# Patient Record
Sex: Male | Born: 1935 | Race: Black or African American | Hispanic: No | Marital: Married | Smoking: Former smoker
Health system: Southern US, Community
[De-identification: ages and names within clinical notes are randomized; demographics above are authoritative.]

## PROBLEM LIST (undated history)

## (undated) DIAGNOSIS — I639 Cerebral infarction, unspecified: Secondary | ICD-10-CM

## (undated) DIAGNOSIS — R0600 Dyspnea, unspecified: Secondary | ICD-10-CM

## (undated) DIAGNOSIS — E119 Type 2 diabetes mellitus without complications: Secondary | ICD-10-CM

## (undated) DIAGNOSIS — I10 Essential (primary) hypertension: Secondary | ICD-10-CM

## (undated) DIAGNOSIS — D649 Anemia, unspecified: Secondary | ICD-10-CM

## (undated) DIAGNOSIS — N184 Chronic kidney disease, stage 4 (severe): Secondary | ICD-10-CM

## (undated) DIAGNOSIS — I509 Heart failure, unspecified: Secondary | ICD-10-CM

## (undated) DIAGNOSIS — E785 Hyperlipidemia, unspecified: Secondary | ICD-10-CM

## (undated) HISTORY — PX: NECK SURGERY: SHX720

## (undated) HISTORY — PX: REPLACEMENT TOTAL KNEE: SUR1224

---

## 1998-07-27 ENCOUNTER — Ambulatory Visit (HOSPITAL_BASED_OUTPATIENT_CLINIC_OR_DEPARTMENT_OTHER): Admission: RE | Admit: 1998-07-27 | Discharge: 1998-07-27 | Payer: Self-pay | Admitting: Otolaryngology

## 1998-11-13 ENCOUNTER — Encounter: Payer: Self-pay | Admitting: Orthopedic Surgery

## 1998-11-20 ENCOUNTER — Inpatient Hospital Stay (HOSPITAL_COMMUNITY): Admission: RE | Admit: 1998-11-20 | Discharge: 1998-11-23 | Payer: Self-pay | Admitting: Orthopedic Surgery

## 1998-11-23 ENCOUNTER — Inpatient Hospital Stay (HOSPITAL_COMMUNITY)
Admission: RE | Admit: 1998-11-23 | Discharge: 1998-11-30 | Payer: Self-pay | Admitting: Physical Medicine and Rehabilitation

## 1999-04-02 ENCOUNTER — Ambulatory Visit (HOSPITAL_COMMUNITY): Admission: RE | Admit: 1999-04-02 | Discharge: 1999-04-02 | Payer: Self-pay | Admitting: *Deleted

## 1999-04-02 ENCOUNTER — Encounter: Payer: Self-pay | Admitting: *Deleted

## 2000-08-25 ENCOUNTER — Ambulatory Visit (HOSPITAL_COMMUNITY): Admission: RE | Admit: 2000-08-25 | Discharge: 2000-08-25 | Payer: Self-pay | Admitting: *Deleted

## 2000-11-05 ENCOUNTER — Encounter: Admission: RE | Admit: 2000-11-05 | Discharge: 2001-02-03 | Payer: Self-pay | Admitting: *Deleted

## 2001-05-21 ENCOUNTER — Encounter: Admission: RE | Admit: 2001-05-21 | Discharge: 2001-06-01 | Payer: Self-pay | Admitting: Internal Medicine

## 2001-07-15 ENCOUNTER — Encounter: Payer: Self-pay | Admitting: Specialist

## 2001-07-20 ENCOUNTER — Ambulatory Visit (HOSPITAL_COMMUNITY): Admission: RE | Admit: 2001-07-20 | Discharge: 2001-07-20 | Payer: Self-pay | Admitting: Specialist

## 2001-09-08 ENCOUNTER — Ambulatory Visit (HOSPITAL_COMMUNITY): Admission: RE | Admit: 2001-09-08 | Discharge: 2001-09-08 | Payer: Self-pay | Admitting: *Deleted

## 2001-09-08 ENCOUNTER — Encounter: Payer: Self-pay | Admitting: *Deleted

## 2001-09-23 ENCOUNTER — Encounter: Admission: RE | Admit: 2001-09-23 | Discharge: 2001-10-05 | Payer: Self-pay | Admitting: Internal Medicine

## 2001-09-28 ENCOUNTER — Ambulatory Visit (HOSPITAL_COMMUNITY): Admission: RE | Admit: 2001-09-28 | Discharge: 2001-09-29 | Payer: Self-pay | Admitting: Ophthalmology

## 2001-11-27 ENCOUNTER — Encounter (INDEPENDENT_AMBULATORY_CARE_PROVIDER_SITE_OTHER): Payer: Self-pay | Admitting: Specialist

## 2001-11-27 ENCOUNTER — Ambulatory Visit (HOSPITAL_COMMUNITY): Admission: RE | Admit: 2001-11-27 | Discharge: 2001-11-27 | Payer: Self-pay | Admitting: *Deleted

## 2002-01-25 ENCOUNTER — Encounter (HOSPITAL_BASED_OUTPATIENT_CLINIC_OR_DEPARTMENT_OTHER): Admission: RE | Admit: 2002-01-25 | Discharge: 2002-04-25 | Payer: Self-pay | Admitting: Internal Medicine

## 2002-05-03 ENCOUNTER — Encounter (HOSPITAL_BASED_OUTPATIENT_CLINIC_OR_DEPARTMENT_OTHER): Admission: RE | Admit: 2002-05-03 | Discharge: 2002-05-14 | Payer: Self-pay | Admitting: Internal Medicine

## 2002-09-23 ENCOUNTER — Ambulatory Visit (HOSPITAL_COMMUNITY): Admission: RE | Admit: 2002-09-23 | Discharge: 2002-09-23 | Payer: Self-pay | Admitting: *Deleted

## 2002-09-27 ENCOUNTER — Ambulatory Visit (HOSPITAL_COMMUNITY): Admission: RE | Admit: 2002-09-27 | Discharge: 2002-09-27 | Payer: Self-pay | Admitting: *Deleted

## 2002-09-27 ENCOUNTER — Encounter: Payer: Self-pay | Admitting: *Deleted

## 2003-05-13 ENCOUNTER — Encounter (HOSPITAL_BASED_OUTPATIENT_CLINIC_OR_DEPARTMENT_OTHER): Admission: RE | Admit: 2003-05-13 | Discharge: 2003-05-20 | Payer: Self-pay | Admitting: Internal Medicine

## 2003-11-28 ENCOUNTER — Ambulatory Visit (HOSPITAL_COMMUNITY): Admission: RE | Admit: 2003-11-28 | Discharge: 2003-11-28 | Payer: Self-pay | Admitting: Specialist

## 2004-06-12 ENCOUNTER — Encounter: Admission: RE | Admit: 2004-06-12 | Discharge: 2004-06-12 | Payer: Self-pay | Admitting: Cardiology

## 2004-07-04 ENCOUNTER — Ambulatory Visit (HOSPITAL_COMMUNITY): Admission: RE | Admit: 2004-07-04 | Discharge: 2004-07-04 | Payer: Self-pay | Admitting: Cardiology

## 2004-09-03 ENCOUNTER — Ambulatory Visit: Payer: Self-pay | Admitting: Family Medicine

## 2004-09-04 ENCOUNTER — Encounter: Admission: RE | Admit: 2004-09-04 | Discharge: 2004-09-04 | Payer: Self-pay | Admitting: Family Medicine

## 2004-09-14 ENCOUNTER — Ambulatory Visit: Payer: Self-pay | Admitting: Family Medicine

## 2004-11-20 ENCOUNTER — Ambulatory Visit: Payer: Self-pay | Admitting: Family Medicine

## 2004-11-27 ENCOUNTER — Ambulatory Visit: Payer: Self-pay

## 2004-12-10 ENCOUNTER — Inpatient Hospital Stay (HOSPITAL_COMMUNITY): Admission: RE | Admit: 2004-12-10 | Discharge: 2004-12-13 | Payer: Self-pay | Admitting: Orthopedic Surgery

## 2004-12-10 ENCOUNTER — Ambulatory Visit: Payer: Self-pay | Admitting: Internal Medicine

## 2004-12-13 ENCOUNTER — Ambulatory Visit: Payer: Self-pay | Admitting: Physical Medicine & Rehabilitation

## 2004-12-13 ENCOUNTER — Inpatient Hospital Stay
Admission: RE | Admit: 2004-12-13 | Discharge: 2004-12-18 | Payer: Self-pay | Admitting: Physical Medicine & Rehabilitation

## 2005-01-21 ENCOUNTER — Ambulatory Visit: Payer: Self-pay | Admitting: Family Medicine

## 2005-02-27 ENCOUNTER — Ambulatory Visit: Payer: Self-pay | Admitting: Family Medicine

## 2005-05-13 ENCOUNTER — Ambulatory Visit: Payer: Self-pay | Admitting: Family Medicine

## 2005-07-31 ENCOUNTER — Ambulatory Visit: Payer: Self-pay | Admitting: Family Medicine

## 2005-09-03 ENCOUNTER — Ambulatory Visit: Payer: Self-pay | Admitting: Pulmonary Disease

## 2005-09-08 ENCOUNTER — Encounter: Payer: Self-pay | Admitting: Family Medicine

## 2005-09-16 ENCOUNTER — Ambulatory Visit (HOSPITAL_BASED_OUTPATIENT_CLINIC_OR_DEPARTMENT_OTHER): Admission: RE | Admit: 2005-09-16 | Discharge: 2005-09-16 | Payer: Self-pay | Admitting: Pulmonary Disease

## 2005-09-17 ENCOUNTER — Ambulatory Visit: Payer: Self-pay | Admitting: Pulmonary Disease

## 2005-10-07 ENCOUNTER — Ambulatory Visit: Payer: Self-pay | Admitting: Pulmonary Disease

## 2005-10-29 ENCOUNTER — Ambulatory Visit: Payer: Self-pay | Admitting: Family Medicine

## 2005-10-31 ENCOUNTER — Ambulatory Visit: Payer: Self-pay | Admitting: Pulmonary Disease

## 2005-12-06 ENCOUNTER — Encounter: Payer: Self-pay | Admitting: Family Medicine

## 2006-02-07 ENCOUNTER — Ambulatory Visit: Payer: Self-pay | Admitting: Family Medicine

## 2006-03-21 ENCOUNTER — Encounter: Admission: RE | Admit: 2006-03-21 | Discharge: 2006-03-21 | Payer: Self-pay | Admitting: Orthopedic Surgery

## 2006-05-16 ENCOUNTER — Ambulatory Visit: Payer: Self-pay | Admitting: Pulmonary Disease

## 2006-06-23 ENCOUNTER — Ambulatory Visit: Payer: Self-pay | Admitting: Family Medicine

## 2006-06-27 ENCOUNTER — Encounter: Payer: Self-pay | Admitting: Family Medicine

## 2006-07-22 ENCOUNTER — Ambulatory Visit: Payer: Self-pay | Admitting: Family Medicine

## 2006-07-29 ENCOUNTER — Ambulatory Visit: Payer: Self-pay | Admitting: Gastroenterology

## 2006-07-30 ENCOUNTER — Ambulatory Visit: Payer: Self-pay | Admitting: Gastroenterology

## 2006-08-04 ENCOUNTER — Ambulatory Visit: Payer: Self-pay | Admitting: Pulmonary Disease

## 2006-09-03 ENCOUNTER — Ambulatory Visit: Payer: Self-pay | Admitting: Gastroenterology

## 2006-09-12 ENCOUNTER — Ambulatory Visit: Payer: Self-pay | Admitting: Gastroenterology

## 2006-09-12 ENCOUNTER — Encounter (INDEPENDENT_AMBULATORY_CARE_PROVIDER_SITE_OTHER): Payer: Self-pay | Admitting: Specialist

## 2006-11-18 ENCOUNTER — Ambulatory Visit: Payer: Self-pay | Admitting: Family Medicine

## 2007-01-16 ENCOUNTER — Ambulatory Visit: Payer: Self-pay | Admitting: Vascular Surgery

## 2007-02-04 ENCOUNTER — Ambulatory Visit: Payer: Self-pay | Admitting: Family Medicine

## 2007-03-23 ENCOUNTER — Inpatient Hospital Stay (HOSPITAL_COMMUNITY): Admission: EM | Admit: 2007-03-23 | Discharge: 2007-03-25 | Payer: Self-pay | Admitting: Emergency Medicine

## 2007-03-23 ENCOUNTER — Ambulatory Visit: Payer: Self-pay | Admitting: Family Medicine

## 2007-03-23 DIAGNOSIS — M1990 Unspecified osteoarthritis, unspecified site: Secondary | ICD-10-CM | POA: Insufficient documentation

## 2007-03-23 DIAGNOSIS — R51 Headache: Secondary | ICD-10-CM

## 2007-03-23 DIAGNOSIS — J309 Allergic rhinitis, unspecified: Secondary | ICD-10-CM | POA: Insufficient documentation

## 2007-03-23 DIAGNOSIS — E785 Hyperlipidemia, unspecified: Secondary | ICD-10-CM

## 2007-03-23 DIAGNOSIS — R519 Headache, unspecified: Secondary | ICD-10-CM | POA: Insufficient documentation

## 2007-03-23 DIAGNOSIS — E119 Type 2 diabetes mellitus without complications: Secondary | ICD-10-CM

## 2007-03-23 DIAGNOSIS — I10 Essential (primary) hypertension: Secondary | ICD-10-CM | POA: Insufficient documentation

## 2007-03-24 ENCOUNTER — Ambulatory Visit: Payer: Self-pay | Admitting: Cardiology

## 2007-03-24 ENCOUNTER — Ambulatory Visit: Payer: Self-pay | Admitting: Internal Medicine

## 2007-03-24 ENCOUNTER — Ambulatory Visit: Payer: Self-pay | Admitting: Vascular Surgery

## 2007-03-30 ENCOUNTER — Ambulatory Visit: Payer: Self-pay | Admitting: Family Medicine

## 2007-03-30 DIAGNOSIS — Z8679 Personal history of other diseases of the circulatory system: Secondary | ICD-10-CM

## 2007-04-09 ENCOUNTER — Encounter: Payer: Self-pay | Admitting: Family Medicine

## 2007-04-09 ENCOUNTER — Encounter: Admission: RE | Admit: 2007-04-09 | Discharge: 2007-06-08 | Payer: Self-pay | Admitting: Family

## 2007-04-13 ENCOUNTER — Ambulatory Visit: Payer: Self-pay | Admitting: Family Medicine

## 2007-04-24 ENCOUNTER — Telehealth: Payer: Self-pay | Admitting: Family Medicine

## 2007-04-27 ENCOUNTER — Telehealth: Payer: Self-pay | Admitting: Family Medicine

## 2007-05-04 ENCOUNTER — Encounter: Payer: Self-pay | Admitting: Family Medicine

## 2007-05-18 ENCOUNTER — Ambulatory Visit: Payer: Self-pay | Admitting: Family Medicine

## 2007-05-28 ENCOUNTER — Encounter: Payer: Self-pay | Admitting: Family Medicine

## 2007-05-29 ENCOUNTER — Telehealth: Payer: Self-pay | Admitting: Family Medicine

## 2007-06-03 ENCOUNTER — Encounter: Admission: RE | Admit: 2007-06-03 | Discharge: 2007-07-02 | Payer: Self-pay | Admitting: Neurology

## 2007-06-15 ENCOUNTER — Ambulatory Visit: Payer: Self-pay | Admitting: Family Medicine

## 2007-06-15 DIAGNOSIS — J209 Acute bronchitis, unspecified: Secondary | ICD-10-CM

## 2007-06-15 DIAGNOSIS — N318 Other neuromuscular dysfunction of bladder: Secondary | ICD-10-CM | POA: Insufficient documentation

## 2007-06-22 ENCOUNTER — Telehealth: Payer: Self-pay | Admitting: Family Medicine

## 2007-06-22 DIAGNOSIS — G4733 Obstructive sleep apnea (adult) (pediatric): Secondary | ICD-10-CM

## 2007-06-26 ENCOUNTER — Telehealth: Payer: Self-pay | Admitting: Family Medicine

## 2007-06-29 ENCOUNTER — Encounter: Admission: RE | Admit: 2007-06-29 | Discharge: 2007-08-19 | Payer: Self-pay | Admitting: Family Medicine

## 2007-07-15 ENCOUNTER — Telehealth: Payer: Self-pay | Admitting: Family Medicine

## 2007-07-17 ENCOUNTER — Ambulatory Visit: Payer: Self-pay | Admitting: Family Medicine

## 2007-07-17 DIAGNOSIS — J45909 Unspecified asthma, uncomplicated: Secondary | ICD-10-CM

## 2007-07-20 ENCOUNTER — Ambulatory Visit: Payer: Self-pay | Admitting: Family Medicine

## 2007-07-22 ENCOUNTER — Encounter: Payer: Self-pay | Admitting: Family Medicine

## 2007-07-22 ENCOUNTER — Ambulatory Visit: Payer: Self-pay | Admitting: Vascular Surgery

## 2007-09-01 ENCOUNTER — Telehealth: Payer: Self-pay | Admitting: Family Medicine

## 2008-04-06 ENCOUNTER — Ambulatory Visit: Payer: Self-pay | Admitting: Vascular Surgery

## 2008-06-11 ENCOUNTER — Emergency Department (HOSPITAL_COMMUNITY): Admission: EM | Admit: 2008-06-11 | Discharge: 2008-06-11 | Payer: Self-pay | Admitting: Emergency Medicine

## 2008-06-14 ENCOUNTER — Emergency Department (HOSPITAL_COMMUNITY): Admission: EM | Admit: 2008-06-14 | Discharge: 2008-06-14 | Payer: Self-pay | Admitting: Emergency Medicine

## 2008-06-15 ENCOUNTER — Ambulatory Visit: Payer: Self-pay | Admitting: Family Medicine

## 2008-06-20 ENCOUNTER — Telehealth: Payer: Self-pay | Admitting: Family Medicine

## 2008-06-29 ENCOUNTER — Encounter: Payer: Self-pay | Admitting: Family Medicine

## 2008-07-04 ENCOUNTER — Ambulatory Visit: Payer: Self-pay | Admitting: Family Medicine

## 2008-07-04 DIAGNOSIS — N400 Enlarged prostate without lower urinary tract symptoms: Secondary | ICD-10-CM

## 2008-07-07 LAB — CONVERTED CEMR LAB
ALT: 24 U/L
AST: 23 U/L
Albumin: 3.8 g/dL
Alkaline Phosphatase: 38 U/L — ABNORMAL LOW
BUN: 24 mg/dL — ABNORMAL HIGH
Basophils Absolute: 0 K/uL
Basophils Relative: 0.5 %
Bilirubin, Direct: 0.1 mg/dL
CO2: 27 meq/L
Calcium: 9 mg/dL
Chloride: 104 meq/L
Cholesterol: 161 mg/dL
Creatinine, Ser: 1.7 mg/dL — ABNORMAL HIGH
Eosinophils Absolute: 0.2 K/uL
Eosinophils Relative: 6.7 % — ABNORMAL HIGH
GFR calc Af Amer: 51 mL/min
GFR calc non Af Amer: 42 mL/min
Glucose, Bld: 86 mg/dL
HCT: 35.1 % — ABNORMAL LOW
HDL: 57.9 mg/dL
Hemoglobin: 11.9 g/dL — ABNORMAL LOW
Hgb A1c MFr Bld: 6 %
LDL Cholesterol: 90 mg/dL
Lymphocytes Relative: 27.7 %
MCHC: 33.9 g/dL
MCV: 92.9 fL
Monocytes Absolute: 0.4 K/uL
Monocytes Relative: 12.1 % — ABNORMAL HIGH
Neutro Abs: 2.1 K/uL
Neutrophils Relative %: 53 %
PSA: 0.87 ng/mL
Platelets: 126 K/uL — ABNORMAL LOW
Potassium: 3.7 meq/L
RBC: 3.78 M/uL — ABNORMAL LOW
RDW: 13.1 %
Sodium: 138 meq/L
TSH: 1.35 u[IU]/mL
Total Bilirubin: 1 mg/dL
Total CHOL/HDL Ratio: 2.8
Total Protein: 7 g/dL
Triglycerides: 67 mg/dL
VLDL: 13 mg/dL
WBC: 3.7 10*3/microliter — ABNORMAL LOW

## 2008-07-08 ENCOUNTER — Ambulatory Visit: Payer: Self-pay | Admitting: Cardiology

## 2008-07-08 ENCOUNTER — Ambulatory Visit: Payer: Self-pay | Admitting: Hematology and Oncology

## 2008-07-27 ENCOUNTER — Encounter: Payer: Self-pay | Admitting: Family Medicine

## 2008-08-01 ENCOUNTER — Ambulatory Visit (HOSPITAL_COMMUNITY): Admission: RE | Admit: 2008-08-01 | Discharge: 2008-08-01 | Payer: Self-pay | Admitting: Hematology and Oncology

## 2008-08-03 ENCOUNTER — Ambulatory Visit: Payer: Self-pay | Admitting: Cardiology

## 2008-08-05 ENCOUNTER — Encounter: Payer: Self-pay | Admitting: Family Medicine

## 2008-08-26 ENCOUNTER — Telehealth: Payer: Self-pay | Admitting: *Deleted

## 2008-09-30 ENCOUNTER — Ambulatory Visit: Payer: Self-pay | Admitting: Family Medicine

## 2008-10-04 ENCOUNTER — Telehealth: Payer: Self-pay | Admitting: Family Medicine

## 2008-11-11 ENCOUNTER — Ambulatory Visit: Payer: Self-pay | Admitting: Family Medicine

## 2008-11-11 DIAGNOSIS — R259 Unspecified abnormal involuntary movements: Secondary | ICD-10-CM | POA: Insufficient documentation

## 2008-11-16 ENCOUNTER — Telehealth: Payer: Self-pay | Admitting: Family Medicine

## 2008-11-16 ENCOUNTER — Ambulatory Visit: Payer: Self-pay | Admitting: Vascular Surgery

## 2008-11-17 ENCOUNTER — Ambulatory Visit: Payer: Self-pay | Admitting: Cardiovascular Disease

## 2008-11-23 ENCOUNTER — Encounter: Payer: Self-pay | Admitting: Family Medicine

## 2008-11-25 DIAGNOSIS — D696 Thrombocytopenia, unspecified: Secondary | ICD-10-CM

## 2008-11-25 DIAGNOSIS — I739 Peripheral vascular disease, unspecified: Secondary | ICD-10-CM

## 2008-11-29 ENCOUNTER — Encounter: Payer: Self-pay | Admitting: Cardiology

## 2008-11-29 ENCOUNTER — Ambulatory Visit: Payer: Self-pay | Admitting: Cardiology

## 2008-11-29 DIAGNOSIS — R609 Edema, unspecified: Secondary | ICD-10-CM

## 2008-12-16 ENCOUNTER — Ambulatory Visit: Payer: Self-pay | Admitting: Family Medicine

## 2009-02-24 ENCOUNTER — Encounter: Payer: Self-pay | Admitting: Cardiology

## 2009-02-24 DIAGNOSIS — I350 Nonrheumatic aortic (valve) stenosis: Secondary | ICD-10-CM

## 2009-02-27 ENCOUNTER — Ambulatory Visit: Payer: Self-pay | Admitting: Cardiology

## 2009-02-28 ENCOUNTER — Ambulatory Visit (HOSPITAL_COMMUNITY): Admission: RE | Admit: 2009-02-28 | Discharge: 2009-02-28 | Payer: Self-pay | Admitting: Orthopedic Surgery

## 2009-03-15 ENCOUNTER — Ambulatory Visit: Payer: Self-pay | Admitting: Family Medicine

## 2009-04-07 ENCOUNTER — Encounter: Payer: Self-pay | Admitting: Family Medicine

## 2009-04-11 ENCOUNTER — Ambulatory Visit: Payer: Self-pay | Admitting: Cardiology

## 2009-04-11 ENCOUNTER — Encounter: Admission: RE | Admit: 2009-04-11 | Discharge: 2009-04-11 | Payer: Self-pay | Admitting: Allergy

## 2009-04-19 ENCOUNTER — Ambulatory Visit: Payer: Self-pay | Admitting: Pulmonary Disease

## 2009-04-19 DIAGNOSIS — R05 Cough: Secondary | ICD-10-CM

## 2009-05-11 ENCOUNTER — Telehealth: Payer: Self-pay | Admitting: Pulmonary Disease

## 2009-06-07 ENCOUNTER — Telehealth (INDEPENDENT_AMBULATORY_CARE_PROVIDER_SITE_OTHER): Payer: Self-pay | Admitting: *Deleted

## 2009-06-11 ENCOUNTER — Encounter: Payer: Self-pay | Admitting: Pulmonary Disease

## 2009-06-14 ENCOUNTER — Encounter (INDEPENDENT_AMBULATORY_CARE_PROVIDER_SITE_OTHER): Payer: Self-pay | Admitting: *Deleted

## 2009-07-12 ENCOUNTER — Telehealth: Payer: Self-pay | Admitting: Family Medicine

## 2009-07-21 ENCOUNTER — Telehealth: Payer: Self-pay | Admitting: Pulmonary Disease

## 2009-08-17 ENCOUNTER — Encounter (INDEPENDENT_AMBULATORY_CARE_PROVIDER_SITE_OTHER): Payer: Self-pay | Admitting: *Deleted

## 2010-01-29 ENCOUNTER — Telehealth: Payer: Self-pay | Admitting: *Deleted

## 2010-02-26 ENCOUNTER — Inpatient Hospital Stay (HOSPITAL_COMMUNITY): Admission: RE | Admit: 2010-02-26 | Discharge: 2010-03-03 | Payer: Self-pay | Admitting: Orthopedic Surgery

## 2010-09-18 NOTE — Consult Note (Signed)
Summary: [facility]  [facility]   Imported By: Laural Benes 01/10/2010 13:39:32  _____________________________________________________________________  External Attachment:    Type:   Image     Comment:   External Document

## 2010-09-18 NOTE — Letter (Signed)
Summary: [facility]  [facility]   Imported By: Laural Benes 01/22/2010 14:06:09  _____________________________________________________________________  External Attachment:    Type:   Image     Comment:   External Document

## 2010-09-18 NOTE — Progress Notes (Signed)
Summary: refill   Phone Note From Pharmacy   Caller: Bally* Reason for Call: Needs renewal Details for Reason: glyburide/metformin 5/500mg  Initial call taken by: Sherron Monday, [place] ([place]),  January 29, 2010 3:56 PM    Prescriptions: GLYBURIDE-METFORMIN 5-500 MG  TABS (GLYBURIDE-METFORMIN) take one tab by mouth two times a day  #60 x 0   Entered by:   Sherron Monday, CMA (AAMA)   Authorized by:   Laurey Morale MD   Signed by:   Sherron Monday, CMA ([place]) on 01/29/2010   Method used:   Electronically to        [facility] (retail)       [address], [place]  [address]       Ph: [phone] or [phone]       Fax: [phone]   RxID:   [id number]

## 2010-11-03 LAB — PROTIME-INR
INR: 2.08 — ABNORMAL HIGH (ref 0.00–1.49)
Prothrombin Time: 20.7 seconds — ABNORMAL HIGH (ref 11.6–15.2)
Prothrombin Time: 23.2 seconds — ABNORMAL HIGH (ref 11.6–15.2)

## 2010-11-03 LAB — CBC
Hemoglobin: 9.3 g/dL — ABNORMAL LOW (ref 13.0–17.0)
MCH: 30.5 pg (ref 26.0–34.0)
MCHC: 34.2 g/dL (ref 30.0–36.0)
RDW: 13.5 % (ref 11.5–15.5)

## 2010-11-03 LAB — BASIC METABOLIC PANEL
BUN: 38 mg/dL — ABNORMAL HIGH (ref 6–23)
CO2: 26 mEq/L (ref 19–32)
Calcium: 8.4 mg/dL (ref 8.4–10.5)
GFR calc non Af Amer: 28 mL/min — ABNORMAL LOW (ref >60)
Glucose, Bld: 173 mg/dL — ABNORMAL HIGH (ref 70–99)
Sodium: 134 mEq/L — ABNORMAL LOW (ref 135–145)

## 2010-11-03 LAB — GLUCOSE, CAPILLARY
Glucose-Capillary: 171 mg/dL — ABNORMAL HIGH (ref 70–99)
Glucose-Capillary: 215 mg/dL — ABNORMAL HIGH (ref 70–99)
Glucose-Capillary: 252 mg/dL — ABNORMAL HIGH (ref 70–99)

## 2010-11-04 LAB — BASIC METABOLIC PANEL
BUN: 33 mg/dL — ABNORMAL HIGH (ref 6–23)
BUN: 42 mg/dL — ABNORMAL HIGH (ref 6–23)
CO2: 22 mEq/L (ref 19–32)
CO2: 24 mEq/L (ref 19–32)
Calcium: 8.1 mg/dL — ABNORMAL LOW (ref 8.4–10.5)
Calcium: 8.2 mg/dL — ABNORMAL LOW (ref 8.4–10.5)
Chloride: 103 mEq/L (ref 96–112)
Creatinine, Ser: 2.44 mg/dL — ABNORMAL HIGH (ref 0.4–1.5)
Creatinine, Ser: 2.61 mg/dL — ABNORMAL HIGH (ref 0.4–1.5)
Creatinine, Ser: 2.89 mg/dL — ABNORMAL HIGH (ref 0.4–1.5)
GFR calc Af Amer: 26 mL/min — ABNORMAL LOW (ref >60)
GFR calc Af Amer: 29 mL/min — ABNORMAL LOW (ref >60)
Glucose, Bld: 190 mg/dL — ABNORMAL HIGH (ref 70–99)
Sodium: 133 mEq/L — ABNORMAL LOW (ref 135–145)

## 2010-11-04 LAB — CBC
HCT: 26.3 % — ABNORMAL LOW (ref 39.0–52.0)
HCT: 32.8 % — ABNORMAL LOW (ref 39.0–52.0)
Hemoglobin: 8.9 g/dL — ABNORMAL LOW (ref 13.0–17.0)
MCH: 30.4 pg (ref 26.0–34.0)
MCH: 30.6 pg (ref 26.0–34.0)
MCHC: 34.1 g/dL (ref 30.0–36.0)
MCHC: 34.2 g/dL (ref 30.0–36.0)
MCV: 89.4 fL (ref 78.0–100.0)
MCV: 91.5 fL (ref 78.0–100.0)
Platelets: 131 10*3/uL — ABNORMAL LOW (ref 150–400)
Platelets: 133 10*3/uL — ABNORMAL LOW (ref 150–400)
RBC: 2.94 MIL/uL — ABNORMAL LOW (ref 4.22–5.81)
RBC: 3.58 MIL/uL — ABNORMAL LOW (ref 4.22–5.81)
RDW: 13.1 % (ref 11.5–15.5)
RDW: 13.7 % (ref 11.5–15.5)
WBC: 3.7 10*3/uL — ABNORMAL LOW (ref 4.0–10.5)
WBC: 5.8 10*3/uL (ref 4.0–10.5)
WBC: 5.8 10*3/uL (ref 4.0–10.5)

## 2010-11-04 LAB — GLUCOSE, CAPILLARY
Glucose-Capillary: 171 mg/dL — ABNORMAL HIGH (ref 70–99)
Glucose-Capillary: 173 mg/dL — ABNORMAL HIGH (ref 70–99)
Glucose-Capillary: 188 mg/dL — ABNORMAL HIGH (ref 70–99)
Glucose-Capillary: 193 mg/dL — ABNORMAL HIGH (ref 70–99)
Glucose-Capillary: 197 mg/dL — ABNORMAL HIGH (ref 70–99)
Glucose-Capillary: 203 mg/dL — ABNORMAL HIGH (ref 70–99)
Glucose-Capillary: 205 mg/dL — ABNORMAL HIGH (ref 70–99)
Glucose-Capillary: 208 mg/dL — ABNORMAL HIGH (ref 70–99)
Glucose-Capillary: 219 mg/dL — ABNORMAL HIGH (ref 70–99)
Glucose-Capillary: 99 mg/dL (ref 70–99)

## 2010-11-04 LAB — ABO/RH: ABO/RH(D): AB POS

## 2010-11-04 LAB — COMPREHENSIVE METABOLIC PANEL
Alkaline Phosphatase: 44 U/L (ref 39–117)
BUN: 24 mg/dL — ABNORMAL HIGH (ref 6–23)
CO2: 26 mEq/L (ref 19–32)
Chloride: 108 mEq/L (ref 96–112)
Glucose, Bld: 128 mg/dL — ABNORMAL HIGH (ref 70–99)
Potassium: 3.9 mEq/L (ref 3.5–5.1)
Total Bilirubin: 0.7 mg/dL (ref 0.3–1.2)

## 2010-11-04 LAB — URINALYSIS, ROUTINE W REFLEX MICROSCOPIC
Bilirubin Urine: NEGATIVE
Glucose, UA: NEGATIVE mg/dL
Ketones, ur: NEGATIVE mg/dL
Protein, ur: 100 mg/dL — AB
pH: 6 (ref 5.0–8.0)

## 2010-11-04 LAB — PROTIME-INR
INR: 1.13 (ref 0.00–1.49)
INR: 1.21 (ref 0.00–1.49)
INR: 1.35 (ref 0.00–1.49)
INR: 1.62 — ABNORMAL HIGH (ref 0.00–1.49)
Prothrombin Time: 14.4 seconds (ref 11.6–15.2)
Prothrombin Time: 19.1 seconds — ABNORMAL HIGH (ref 11.6–15.2)

## 2010-11-04 LAB — TYPE AND SCREEN

## 2010-11-04 LAB — BODY FLUID CULTURE

## 2010-11-04 LAB — ANAEROBIC CULTURE

## 2010-11-04 LAB — GRAM STAIN

## 2011-01-01 NOTE — Assessment & Plan Note (Signed)
OFFICE VISIT   [name], [name]  DOB:  Aug 20, 1928                                       04/06/2008  [id number]   The patient returns for followup today.  He was last seen in December of  2008.  We had been following him for lower extremity claudication  symptoms.  He still has some intermittent swelling in his left leg but  states the compression stockings help with this.  Overall his  claudication symptoms are unchanged.  He states that he has not walking  as much recently because his wife has been having to travel back and  forth to [place].  He is able to walk 1-2 miles per day without  significant symptoms.  He has had no further stroke symptoms.  Of note,  he had a carotid duplex exam in August of 2008 which showed no carotid  stenosis.  His primary atherosclerotic risk factor continues to remain  diabetes.  He states that his glucose usually is less than 118.  He is  currently going to a podiatrist to have his nails trimmed.  Overall he  has done well and has had no significant change since seen 6 months ago.   On physical examination today blood pressure is 148/76 in the left arm,  pulse is 56 and regular.  Chest:  Clear to auscultation.  Cardiac:  Exam  is regular rate and rhythm without murmur.  Abdomen:  Is obese, soft,  nontender, nondistended with no mass.  Lower extremities:  He has 2+  femoral pulses with absent popliteal and pedal pulses bilaterally.  He  has trace edema in the left lower extremity.  His feet are dry with some  scaling of the skin but this is less prominent than on previous visits.  He has no ulcerations on the feet.   He had bilateral ABIs performed today.  The waveforms were monophasic in  the lower extremity bilaterally.  ABI was 0.64 on the left and 0.80 on  the right.  These are overall similar to his previous ABIs.   The patient overall has been stable.  His symptoms have not worsened.  He is currently not at  risk of limb threatening ischemia.  He will  continue with risk factor modification and treating his diabetes.  He  will also try to walk more in the next 6 months.  He will follow up with  me for repeat ABIs in 6 months' time.   Jessy Oto. Fields, MD  Electronically Signed   CEF/MEDQ  D:  04/07/2008  T:  04/07/2008  Job:  [id number]   cc:   Annie Main A. Sarajane Jews, MD

## 2011-01-01 NOTE — Assessment & Plan Note (Signed)
OFFICE VISIT   KASAAN, SPAMPINATO  DOB:  11-Jun-1929                                       11/16/2008  [id number]   The patient returns for followup today.  He was last seen in December of  2008.  We have been following him for mild claudication symptoms and  lower extremity occlusive disease.  His primary atherosclerotic risk  factor is diabetes.  He states that he is walking well since his last  office visit.  He really has no specific complaints of claudication at  this time.  He has been walking a moderate amount helping his wife who  has been receiving cancer treatments for metastatic cancer at [facility].  He states that he has some occasional swelling in his left  leg.  He states that he is able to walk approximately 2 miles without  pain.  He denies any rest pain.   PHYSICAL EXAM:  Blood pressure is 179/76 in the left arm, pulse was 65  and regular.  HEENT is unremarkable.  Neck has 2+ carotid pulses without  bruit.  Chest is clear to auscultation.  Cardiac exam is regular rate  and rhythm.  Abdomen is obese, soft, nontender, nondistended.  No  masses.  Extremities, he has 2+ radial, 2+ femoral pulses bilaterally.  He has a 2+ left popliteal pulse.  He has absent right popliteal pulse.  He has absent pedal pulses bilaterally.  Feet are pink, warm and well-  perfused.  His feet usually have fairly dry scaly skin and this is  markedly improved today.  He has been applying a generous amount of  moisturizing lotion to these.  He does have stasis hemosiderin deposits  in the left medial leg and some edema in the left leg which is slightly  more than the right leg.   He currently is taking aspirin 325 mg once a day.   He had bilateral ABIs performed today which were 0.79 on the right, 0.63  on the left.  These are fairly similar to his previous study.   The patient has evidence of bilateral lower extremity arterial occlusive  disease.  He  also has some changes consistent with venous disease in his  left lower extremity.  I believe the best option for him would be  compression stockings primarily on his left leg.  As far as his arterial  disease is concerned this seems to be fairly stable overall and is  essentially asymptomatic.  He will follow up with me in 6 months' time  for repeat ABIs.   Jessy Oto. Fields, MD  Electronically Signed   CEF/MEDQ  D:  11/17/2008  T:  11/17/2008  Job:  [id number]   cc:   Annie Main A. Sarajane Jews, MD

## 2011-01-01 NOTE — Discharge Summary (Signed)
[name], [name]             ACCOUNT NO.:  [id number]   MEDICAL RECORD NO.:  [id number]          PATIENT TYPE:  INP   LOCATION:  68                         FACILITY:  [facility]   PHYSICIAN:  Valerie A. Asa Lente, MDDATE OF BIRTH:  Apr 10, 1929   DATE OF ADMISSION:  03/23/2007  DATE OF DISCHARGE:  03/25/2007                               DISCHARGE SUMMARY   DISCHARGE DIAGNOSES:  1. Left upper extremity/left lower extremity weakness.  2. Right-sided cerebrovascular accident.  3. Dyslipidemia.  4. Hypertension.  5. Headache, likely related to Aggrenox.  6. Benign prostatic hypertrophy.   HISTORY OF PRESENT ILLNESS:  [name] [name] is a 78 year old man who was  admitted March 23, 2007 status post fall, and with complaints of left  leg weakness.  He has a past medical history of hypertension, diabetes,  and noted that these symptoms had been present for 1 day prior to  admission.  He was noted on admission to be hypertensive and was  admitted for further evaluation and treatment.   PAST MEDICAL HISTORY:  Problem list:  1. Hypertension.  2. Diabetes.  3. Mild prostate enlargement.   COURSE OF HOSPITALIZATION:  1. Left upper extremity and left lower extremity weakness status post      right-sided CVA.  The patient was admitted and underwent __________      this was followed by an MRI and MRA which show acute/subacute      infarct of the right pons, chronic microvascular changes are noted.      The patient was noted to have right ICA narrowing at 50% maximally.      A 2-D echo was performed that showed a left ventricular ejection      fraction 55% to 60%, with mild diastolic dysfunction.  Homocystine      was within normal limits.  The patient was noted to have elevated      lipids, and it was noted that he had a history of being intolerant      to statins and therefore we will defer to patient's primary care      regarding further lipid management.  2. Hypertension.  The patient was  noted to have elevated blood      pressure despite being on Norvasc.  Beta-blocker was resumed at a      decreased dose.  He was also started on Aggrenox during this      admission in place of aspirin.   LABORATORY DATA:  Pertinent laboratories at the time of discharge, liver  function tests within normal limits.  BUN 24, creatinine was 0.3, INR  1.0.   MEDICATIONS AT THE TIME OF DISCHARGE:  1. Atenolol 50 mg 1/2 tab p.o. daily.  2. Avodart 0.5 mg p.o. daily.  3. Actos 45 mg p.o. daily.  4. Neurontin 300 mg p.o. nightly.  5. Glucophage 500 mg p.o. b.i.d.  6. Norvasc 10 mg every day.  7. Aggrenox 1 cap p.o. daily at bedtime through March 30, 2007, then      start 1 cap p.o. b.i.d.  8. Tylenol 650 mg p.o. q.6 hours p.r.n.   DISPOSITION:  Patient is discharged to home.  Instructions to follow up  patient with Dr. Alysia Penna on March 30, 2007 at 11:30 a.m.      Debbrah Alar, NP      Jannifer Rodney. Asa Lente, MD  Electronically Signed    MO/MEDQ  D:  04/21/2007  T:  04/21/2007  Job:  [id number]   cc:   Annie Main A. Sarajane Jews, MD

## 2011-01-01 NOTE — Consult Note (Signed)
NAMEJAIMESON, VITIELLO             ACCOUNT NO.:  [id number]   MEDICAL RECORD NO.:  [id number]          PATIENT TYPE:  INP   LOCATION:  [id number]                         FACILITY:  [facility]   PHYSICIAN:  Annita Brod, M.D.DATE OF BIRTH:  04-Oct-1928   DATE OF CONSULTATION:  DATE OF DISCHARGE:                                 CONSULTATION   PRIMARY CARE PHYSICIAN:  Ishmael Holter. Sarajane Jews, M.D.   CHIEF COMPLAINT:  Left leg weakness and fall.   HISTORY OF PRESENT ILLNESS:  The patient is a 77 year old African  American male, past medical history of hypertension and diabetes, who  presents to the emergency room after a one-day onset of left leg  weakness.  He previously had been well with no complaints.  Starting  yesterday, his left leg gave out from underneath him.  He had a hard  time bearing weight but then today it continued to occur and he fell  over, not being able to stand up.  He also complained of a mild  headache.  His wife noted that after his fall, while he did not appear  to be too injured, he started having problems with slurred speech.  He  had no problems with confusion and no problems with any other weakness  elsewhere but obviously became concerned about the possibility of a  stroke and brought him into the emergency room.  In the emergency room, he was noted to have a blood pressure of 184/83  with a heart rate of 51 on admission.  The rest of his labs were noted  for a normal coags, normal electrolytes but an elevated BUN of 31 and  creatinine of 1.5.  Cardiac markers were unremarkable.  CT scan of the  head showed no acute intracranial abnormalities.  The patient currently is doing well.  He has continued slurred speech  and some left leg weakness, otherwise he complains of mild headache.  Denies any vision changes, no dysphagia, no chest pain or palpitations,  no shortness of breath, no wheezing, coughing, no abdominal pain, no  hematemesis, no dysuria, no constipation, no  diarrhea, no focal  extremity numbness, weakness, or pain other than what is described in  his left leg weakness.  He also tells me he has very mild bilateral  lower extremity numbness from his diabetes but says this is very  minimal.   REVIEW OF SYSTEMS:  Otherwise negative.   PAST MEDICAL HISTORY:  1. Hypertension.  2. Diabetes.  3. Mild prostate enlargement issues.   MEDICATIONS:  1. Metformin 500 p.o. b.i.d.  2. Aspirin 81 mg p.o. daily.  3. Actos 45 p.o. daily.  4. Norvasc 10 p.o. daily.  5. Neurontin 300 p.o. q.h.s.  6. Avodart 0.5 p.o. daily.  7. Atenolol 100 p.o. daily.   He has no known drug allergies.   SOCIAL HISTORY:  He denies any tobacco, alcohol, or drug use.   FAMILY HISTORY:  Noncontributory.   PHYSICAL EXAMINATION:  VITAL SIGNS:  On admission temp is 97.4, heart  rate 51, blood pressure 184/83, respirations 18, O2 sat 97% on room air.  GENERAL:  The  patient is alert and oriented x3 in no apparent distress.  HEENT:  Normocephalic atraumatic.  Cranial nerves II-XII are intact.  His wife does note some again continued slurred speech.  He has no  carotid bruits.  HEART:  Regular rate and rhythm.  S1 S2.  A 2/6 systolic ejection  murmur.  LUNGS:  Clear to auscultation bilaterally.  ABDOMEN:  Soft, nontender, nondistended.  Positive bowel sounds.  EXTREMITIES:  Show no clubbing, cyanosis, perhaps a trace pitting edema.  I was unable to examine his right upper extremity secondary to attempts  by the IV team to place an IV line but the rest of his extremities, his  left upper extremity shows a 5/5 in terms of grip, flexion, and  extension.  His right lower extremity shows no loss of sensation.  Due  to heavily callused feet, I am not able to obtain a plus or minus on his  Babinski sign either way.  He apparently has for plantar flexion he has  5/5 bilaterally, however, dorsiflexion he perhaps has a 5 minus of the  left side but it is very minimal at best.  The  weakness in his left  lower extremity  at the knee also is very minimal if at all.  Perhaps  even subjective by my exam.   LABORATORY:  White count 4.7, H&H 11.9 and 36.4, MCV 91, platelet count  163, no shift.  Coags normal.  Sodium 137, potassium 4, chloride 105,  bicarb 26, BUN 31, creatinine 1.5, glucose 118.  CPK 116, MB less than  1, troponin less than 0.05.  CT scan of the head is unremarkable.  An  EKG today shows a sinus bradycardia, occasional PACs, some T wave  abnormality with flipped T waves in V4 and V5.   ASSESSMENT/PLAN:  1. Left lower extremity weakness and slurred speech, suspicious for      cerebrovascular accident.  We will check an MRI, bilateral carotid      Dopplers, 2D echo, fasting lipid profile, and homocystine level.      If these are positive, we will discuss with neurology.  2. Hypertension, malignant.  We will plan to continue the patient's      antihypertensives.  3. Mild bradycardia likely from his atenolol.  We will try additional      agent such as nitro paste if his blood pressure does not come down.  4. Diabetes mellitus.  We will check a hemoglobin A1c, continue      sliding scale, and his oral medication, Metformin.  5. Benign prostatic hypertrophy, continue Avodart.      Annita Brod, M.D.  Electronically Signed     SKK/MEDQ  D:  03/23/2007  T:  03/23/2007  Job:  [id number]   cc:   Annie Main A. Sarajane Jews, MD

## 2011-01-01 NOTE — Assessment & Plan Note (Signed)
[facility] OFFICE NOTE   NAME:Croucher, MARQUON CIPRES                    MRN:          [id number]  DATE:11/17/2008                            DOB:          [birth year]    REASON FOR VISIT:  Hypertension.   HISTORY OF PRESENT ILLNESS:  Mr. Kapral is a 77 year old gentleman  with longstanding severe hypertension.  He is managed with multiple  antihypertensive medications and has been very difficult to control.  He  has dealt with hypertension for many years.  He presented because of  persistent problems with hypertension in spite of his multiple  medications.  His wife has been checking his blood pressure which was  ranged from the [id number] to [id number] systolic over 123XX123 to 123XX123 diastolic.  The  patient denies headaches or visual changes.  He has had no chest pain.  He has chronic dyspnea that is unchanged overtime.  His main complaint  is that he feels very fatigued after taking his antihypertensive  medicines and his symptom is bothersome even when his blood pressure  remains high.  He has mild chronic lower extremity edema.  No other  complaints at this time.   MEDICATIONS:  1. Amlodipine 10 mg daily.  2. Simvastatin 40 mg daily.  3. Singulair 10 mg daily.  4. Actos 45 mg daily.  5. Labetalol 200 mg b.i.d.  6. Aspirin 325 mg daily.  7. Glipizide/metformin 5/500 mg daily.  8. Detrol LA 4 mg daily.  9. Cozaar 50 mg daily.  10.Ranitidine 150 mg daily as needed.  11.Lasix 40 mg daily.  12.Potassium chloride 10 mEq daily.  13.Clonidine 0.3 mg b.i.d.  14.Zyrtec daily.   ALLERGIES:  NKDA.   PAST MEDICAL HISTORY:  Reviewed and includes malignant hypertension,  hypercholesterolemia, type 2 diabetes, history of stroke, peripheral  arterial disease, obstructive sleep apnea.   REVIEW OF SYSTEMS:  Ten-point review of systems was performed.  No  pertinent positives to report except as noted in the HPI.   PHYSICAL  EXAMINATION:  GENERAL:  The patient is alert and oriented,  obese male in no acute distress.  VITAL SIGNS:  Weight is 275 pounds, blood pressure 166/84, heart rate  58, respiratory rate 16.  HEENT:  Normal.  NECK:  Normal carotid upstrokes.  No bruits.  JVP normal.  No  thyromegaly or thyroid nodules.  LUNGS:  Clear bilaterally.  HEART:  Regular rate and rhythm.  No murmurs or gallops.  ABDOMEN:  Soft, obese, nontender.  No organomegaly.  BACK:  No CVA tenderness.  EXTREMITIES:  There is trace pretibial edema bilaterally.  SKIN:  Warm and dry without rash.  NEUROLOGIC:  Cranial nerves II through XII are intact.  Strength is  intact and equal bilaterally.   EKG shows normal sinus rhythm with LVH, T-wave abnormality,  repolarization versus lateral ischemia.   ASSESSMENT:  This is a 77 year old gentleman with malignant  hypertension.  Blood pressure difficult to manage in spite of multiple  classes of antihypertensive medication.  The patient has not been  evaluated for renovascular hypertension, but I think this is  unlikely  and also difficult to diagnose in this gentleman.  He is not a good  candidate for CT angiography because of renal insufficiency.  Renal  duplex would be unrevealing because of his abdominal girth.  Recommend  continued medical therapy.  We looked into options including an increase  in one of his current antihypertensive medications versus a new agent.  In reviewing his medicines, he is on maximum dose of amlodipine and a  good dose of labetalol and clonidine.  He is having a good response to  Lasix and does not want to increase this because of impairment in his  lifestyle with frequent urination.  I have recommended him having  hydralazine 25 mg three times daily.  The patient will follow up with  Dr. Ron Parker in approximately 2 weeks to see how he has responded to  additional medication.  Remaining problems appear stable.  No other  medicine changes were made  today.     Juanda Bond. Burt Knack, MD  Electronically Signed    MDC/MedQ  DD: 11/17/2008  DT: 11/18/2008  Job #: [id number]   cc:   Carlena Bjornstad, MD, [place] A. Sarajane Jews, MD

## 2011-01-01 NOTE — Assessment & Plan Note (Signed)
OFFICE VISIT   [name] [name], [name] [name]  DOB:  1929/02/01                                       01/16/2007  [id number]   [name] [name] today returns for further follow up regarding lower  extremity claudication symptoms.  He has been very active this spring  and is raising a garden.  He states that he is walking 1-2 miles per day  with minimal symptoms.  He apparently had some recent bleeding from an  ulcer in his stomach but did not require transfusion for this.  He  continues to have fairly thickened nails and dry, scaly feet.   On exam today blood pressure is 186/87, heart rate is 54 and regular.  The lower extremities showed palpable 2+ femoral pulses bilaterally but  no popliteal or pedal pulses.  The feet are still dry and scaly with  thickened nails.  There are no ulcerations.   ABI today on the right was 0.93 and on the left 0.7.  These are  essentially unchanged from November 2007.  Overall [name] [name] is doing  okay.  He still has adequate circulation to his lower extremities.  He  has minimal claudication symptoms.  I informed him to continue his  exercise program.  I also told him that he should continue to place a  moisturizing lotion on his feet to try to prevent cracking of the skin.  We will also try to have an appointment scheduled with his podiatrist in  order to evaluate the toenails.  He will follow up in six months' time  for repeat ABIs.   Jessy Oto. Fields, MD  Electronically Signed   CEF/MEDQ  D:  01/17/2007  T:  01/19/2007  Job:  [id number]   cc:   Annie Main A. Sarajane Jews, MD

## 2011-01-01 NOTE — Assessment & Plan Note (Signed)
OFFICE VISIT   CURT, DUPLER  DOB:  [birth year]                                       07/22/2007  [id number]   Mr. Elster returns for followup today for lower extremity claudication  symptoms.  He states he is still able to walk 1 to 2 miles per day  without significant symptoms.  Unfortunately he had a stroke in August  of 2008 which apparently initially had some left hemiplegia but has  almost completely recovered from this except for the fact that he cannot  completely abduct his left shoulder.  He had a carotid duplex in August  which showed no significant carotid stenosis.  His primary  atherosclerotic risk factor is diabetes.  He states that recently his  glucose is down to 118 to 147 range.  He has no rest pain in his feet  and has had no ulcerations on his feet.  He continues to place a  moisturizing lotion on his feet to prevent cracking.   PHYSICAL EXAMINATION:  On physical examination today, blood pressure is  175/75, heart rate 50 and regular.  His lower extremities have 2+  femoral pulses bilaterally.  He has a 2+ right popliteal pulse and a 1+  left popliteal pulse.  He has no pedal pulses bilaterally.  He does  still have some dry skin on the feet bilaterally but no open wounds.   He had bilateral ABIs performed today which were 0.78 on the left, 0.84  on the right.  This is very similar to previous ABIs done six months  ago.   Overall, Mr. Mcwhinnie is unchanged.  He currently has no change in his  symptoms and currently is not at risk of limb loss.  He will follow up  in six months' time with repeat ABIs.   Jessy Oto. Fields, MD  Electronically Signed   CEF/MEDQ  D:  07/22/2007  T:  07/23/2007  Job:  [id number]   cc:   Annie Main A. Sarajane Jews, MD

## 2011-01-01 NOTE — Assessment & Plan Note (Signed)
[facility] OFFICE NOTE   NAME:[name] [name], [name] [name]                    MRN:          [id number]  DATE:08/03/2008                            DOB:          April 04, 1929    [name] [name] is seen for followup.  I saw him on July 08, 2008.  At  that time, I felt that volume overload was playing a role in his blood  pressure.  Today, in the office, his blood pressure is somewhat better.  His diuretic was changed from hydrochlorothiazide to Lasix.  He did cut  back on his fluid intake.  His edema is somewhat decreased.  His weight  appears to be the same.   PAST MEDICAL HISTORY:   ALLERGIES:  No known drug allergies.   MEDICATIONS:  See the flow sheet.   REVIEW OF SYSTEMS:  He is not having any GU symptoms.  His wife states  that his stool has turned dark.  He is getting some stool guaiacs.  He  has seen a GI in the past.  I believe that the guaiacs were ordered  through Dr. Sarajane Jews.  He has no fevers or chills.  Otherwise, his review of  systems is negative.   PHYSICAL EXAMINATION:  VITAL SIGNS:  Blood pressure is 150/78.  His  pulse of 65.  GENERAL:  The patient is oriented to person, time, and place.  Affect is  normal.  He is here with his wife today.  HEENT:  No xanthelasma.  He has normal extraocular motion.  NECK:  There are no carotid bruits.  There is no jugular venous  distention.  LUNGS:  Clear.  Respiratory effort is not labored.  CARDIAC:  S1 with an S2.  There are no clicks or significant murmurs.  ABDOMEN:  Soft.  EXTREMITIES:  He has trace peripheral edema.   PROBLEMS:  Listed on my note of July 08, 2008.  #8.  Volume overload.  This is under better control at this time.  #9.  Severe hypertension.  We know that his hypertension is long-  standing.  His current meds provide relatively good control.  We will  consider a renal artery Doppler, but not at this time.  It is important  that he follow  through with his Hematology evaluation and with his stool  guaiacs and with decisions about whether he can remain on aspirin.  I do  not think that further cardiac testing is necessary now.  I would like  to see him back in 6 months.  Over the time, we will need to  consider an echo and possibly a renal artery Doppler.  Dr. Sarajane Jews and his  hematologist will need to decide if his aspirin can be continued.     Carlena Bjornstad, MD, [facility]  Electronically Signed    JDK/MedQ  DD: 08/03/2008  DT: 08/04/2008  Job #: [id number]   cc:   Annie Main A. Sarajane Jews, [place]

## 2011-01-01 NOTE — Assessment & Plan Note (Signed)
[facility] OFFICE NOTE   NAME:[name] [name], [name] [name]                    MRN:          [id number]  DATE:07/08/2008                            DOB:          Mar 02, 1929    The patient is 77 years of age.  [name] [name] is seen today for a  cardiac evaluation and the evaluation of his hypertension.  He and his  wife were here together.  He is very pleasant gentleman, who understands  his health care well.  He has a longstanding history of hypertension.  I  do not know if he has ever had renal artery evaluation.  By history, he  may have had this before he moved to this area.  It is clear, however,  that his hypertension is not new onset.  He is on a significant amount  of medication.  At some point, we may well need to assess his renal  arteries.  Fortunately, his renal function is not markedly elevated, but  his creatinine is 1.7.  The BUN is 24 as measured on July 04, 2008.   We had a long discussion about salt and fluid intake.  He does not add  salt.  However, he does eat potato chips infrequently and we discussed  how important it will be for him to not do this.  His fluid intake is  not excessive, but we talked about backing off slightly on this.   PAST MEDICAL HISTORY:   ALLERGIES:  No known drug allergies.   MEDICATIONS:  1. Amlodipine 10.  2. Simvastatin 40.  3. Singulair 10.  4. Actos 45.  5. Clonidine patch TTS patch 3 weekly.  6. Labetalol 200 b.i.d.  7. Hydrochlorothiazide 25 (to be changed to Lasix 40).  8. Aspirin 325.  9. Glipizide.  10.Metformin.  11.Detrol 4.  12.Cozaar 50.  13.Ranitidine 150.   OTHER MEDICAL PROBLEMS:  See the list below.   REVIEW OF SYSTEMS:  At this time, he has some headaches.  He has some  seasonal allergies and some asthma.  He also is bothered by constipation  and arthritis.  These issues are under control.  Otherwise, his review  of systems is negative.   SOCIAL HISTORY:  The patient is retired and lives with his wife.  He  stopped smoking in 1990.   FAMILY HISTORY:  There is no known history of coronary disease at a  young age.   Physical exam, blood pressure today is 170/87 in the right arm and  180/83 in the left arm and his pulse is 65.  The patient is oriented to  person, time, and place.  Affect is normal.  HEENT reveals no  xanthelasma.  He has normal extraocular motion.  There are no carotid  bruits.  There is no jugular venous distention.  Lungs are clear.  Respiratory effort is not labored.  Cardiac exam reveals an S1 with an  S2.  There are no clicks or significant murmurs.  His abdomen is soft.  He does have 1+ peripheral edema.   EKG shows ST-T-wave changes compatible with left  ventricular  hypertrophy.  I do not have an old EKG for tracing.   PROBLEMS:  1. Diabetes.  2. Hyperlipidemia.  3. History of a CVA of some type in the past.  4. Peripheral vascular disease followed by Dr. Murrell Converse with an      ABI of 0.64 in the left leg and 0.8 in the right leg.  5. Obstructive sleep apnea followed by Dr. Gwenette Greet.  6. History of a hernia repair.  7. Status post bilateral knee replacement with a scar next to it.  8. Volume overload.  He is mildly volume overloaded at this time.  9. Severe hypertension.  The patient is on a multitude of medications.      It is possible that better volume control may help him      significantly.  I talked with him about the type of blood pressure      cuff that he uses.  His blood pressure appears to be not as high      when seen in the office.  They had difficulty with an arm cuff and      they are actually using a wrist cuff.  We will have him bring this      in to have it compared with our cuff with the next visit.  He will      watch more carefully his salt intake.  He will drink fluids a      little bit less than he currently is using.  I will change his      hydrochlorothiazide from 25  to 40 mg of Lasix.  We had a long      discussion about this.  I will then see him back in followup.  In      the meantime, it will be okay for his wife to give him an extra 0.1      of p.o. clonidine on a p.r.n. basis as needed.   I will see him back for early followup.     [name] Bjornstad, MD, [facility]  Electronically Signed    JDK/MedQ  DD: 07/08/2008  DT: 07/09/2008  Job #: [id number]   cc:   [name] Main A. Sarajane Jews, MD

## 2011-01-04 NOTE — Procedures (Signed)
[name], [name] NO.:  [id number]   MEDICAL RECORD NO.:  [id number]          PATIENT TYPE:  OUT   LOCATION:  SLEEP CENTER                 FACILITY[facility]  [facility]   PHYSICIAN:  Danton Sewer, M.D. [facility] DATE OF BIRTH:  Sep 22, 1928   DATE OF STUDY:                              NOCTURNAL POLYSOMNOGRAM   Audio too short to transcribe (less than 5 seconds)           ______________________________  Danton Sewer, M.D. [facility]  Diplomate, American Board of Sleep  Medicine     KC/MEDQ  D:  09/17/2005 11:50:56  T:  09/17/2005 11:52:52  Job:  [id number]

## 2011-01-04 NOTE — Op Note (Signed)
NAMECHARLY, PARTIDA             ACCOUNT NO.:  [id number]   MEDICAL RECORD NO.:  [id number]          PATIENT TYPE:  INP   LOCATION:  0007                         [facility]ACILITY:  [facility]   PHYSICIAN:  Gaynelle Arabian, M.D.    DATE OF BIRTH:  02-22-1929   DATE OF PROCEDURE:  DATE OF DISCHARGE:                                 OPERATIVE REPORT   ADDENDUM:  Once we completed the right knee and placed the bandage then I  prepped the left knee and aspirated about 60 mL of synovial fluid from the  left knee. We placed a Band-Aid and then he subsequently awakened and  transported to recovery in stable condition.      FA/MEDQ  D:  12/10/2004  T:  12/10/2004  Job:  [id number]

## 2011-01-04 NOTE — Discharge Summary (Signed)
NAMEJIMMEL, [name] [name]             ACCOUNT NO.:  [id number]   MEDICAL RECORD NO.:  [id number]          PATIENT TYPE:  ORB   LOCATION:  [id number]                         FACILITY:  [facility]   PHYSICIAN:  Meredith Staggers, M.D.DATE OF BIRTH:  [birth year]   DATE OF ADMISSION:  12/13/2004  DATE OF DISCHARGE:  12/18/2004                                 DISCHARGE SUMMARY   DISCHARGE DIAGNOSES:  1.  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  2.  Pain management.  3.  Coumadin for deep venous thrombosis prophylaxis.  4.  Anemia.  5.  Hypertension.  6.  Non-insulin-dependent diabetes mellitus.  7.  Chronic renal insufficiency with creatinine 1.5-2.1.  8.  Urinary retention.   HISTORY OF PRESENT ILLNESS:  This is a 77 year old male.  History of a left  total knee replacement 2000.  Now admitted to [facility] April 24  with end-stage changes of the right knee.  No relief with conservative care.  He underwent a right total knee replacement April 24 per Dr. Wynelle Link.  Placed on Coumadin for deep venous thrombosis prophylaxis.  Weightbearing as  tolerated.  Pain control with PCA.  Discontinued April 26.  Foley catheter  tube removed April 26.  Blood pressure with some mild variables.  His  Tenormin was changed to 50 mg twice daily April 26.  Noted history of non-  insulin-dependent diabetes mellitus with Glucophage recently held April 25  secondary to creatinine 2.1 since improved to 1.5 and Glucophage resumed.  He was admitted for a comprehensive rehabilitation program.   PAST MEDICAL HISTORY:  See discharge diagnoses.   ALLERGIES:  HYDROCODONE.   SOCIAL HISTORY:  Married.  Wife with good support.  One-level home, five  steps to entry.  No alcohol or tobacco.  He is a retired Cabin [employer].   MEDICATIONS PRIOR TO ADMISSION:  1.  Glucophage 500 mg daily.  2.  Hydrochlorothiazide 25 mg daily.  3.  Actos 45 mg daily.  4.  Atenolol 100 mg  daily.  5.  Norvasc 10 mg daily.  6.  Flomax 0.4 mg daily.   HOSPITAL COURSE:  Patient with progressive gains while in rehabilitation  services with therapies initiated daily.  The following issues were followed  during patient's rehabilitation course.  Pertaining to Mr. Mollenhauer's right  total knee replacement of April 24, surgical site healing nicely.  Steri-  Strips in place.  Total hip precautions were initiated.  He was  weightbearing as tolerated.  Would follow up with Dr. Pilar Plate Aluisio Dec 25, 2004.  He remained on oxycodone as needed for breakthrough pain.  Coumadin  for deep venous thrombosis prophylaxis with latest INR of 2.4.  He would  complete Coumadin protocol followed by [place].  Blood  pressure had normalized.  Diastolic pressures of [id number].  He was now on split  doses of Tenormin 50 mg twice daily, his Norvasc of 10 mg daily.  His blood  sugars had overall shown good control of 174, 117, 130.  His Glucophage had  been  resumed of 500 a day with monitoring of creatinine with latest level of  1.1.  Overall for his functional status he was ambulating short household  distances with a walker, essentially independent with standby assist in all  areas of activities of daily living.  He was needing little assistance for  lower body dressing.  He was discharged to home with home therapies.   Latest laboratories showed an INR of 2.4.  Hemoglobin 8.8, hematocrit 25.8,  platelets 132,000.  Sodium 134, potassium 3.5, BUN 16, creatinine 1.1.   DISCHARGE MEDICATIONS:  1.  Coumadin with latest dose of 7.5 mg that he completed on Jan 09, 2005.  2.  Trinsicon twice daily.  3.  Actos 45 mg daily.  4.  Glyburide 5 mg daily.  5.  Xalatan ophthalmic solution 0.005% one drop at bedtime.  6.  Flomax 0.4 mg at bedtime.  7.  Norvasc 10 mg daily.  8.  Tenormin 50 mg twice daily.  9.  Glucophage 500 mg daily.  10. Tylox as needed for breakthrough pain.   ACTIVITY:  As  tolerated.   DIET:  Diabetic diet.   WOUND CARE:  Cleanse incision daily warm soap and water.   SPECIAL INSTRUCTIONS:  Home health nurse per [place] to  check INR on Thursday, Dec 20, 2004.  He would follow up with Dr. Gaynelle Arabian [id number] Dec 25, 2004.      DA/MEDQ  D:  12/18/2004  T:  12/18/2004  Job:  [id number]   cc:   Gaynelle Arabian, M.D.  [facility]  [address]  [place] [address]  Fax: [place] Sarajane Jews, M.D. [facility]

## 2011-01-04 NOTE — Op Note (Signed)
[name], [name]             ACCOUNT NO.:  [id number]   MEDICAL RECORD NO.:  [id number]          PATIENT TYPE:  INP   LOCATION:  0007                         [facility]ACILITY[facility]  [facility]   PHYSICIAN:  Gaynelle Arabian, M.D.    DATE OF BIRTH:  September 13, 1928   DATE OF PROCEDURE:  12/10/2004  DATE OF DISCHARGE:                                 OPERATIVE REPORT   PREOPERATIVE DIAGNOSIS:  Osteoarthritis, right knee.   POSTOPERATIVE DIAGNOSIS:  Osteoarthritis, right knee.   PROCEDURE:  Right total knee arthroplasty.   SURGEON:  Gaynelle Arabian, M.D.   ASSISTANT:  Alexzandrew L. Dara Lords, P.A.   ANESTHESIA:  General with postoperative Marcaine pain pump.   ESTIMATED BLOOD LOSS:  Minimal.   DRAIN:  Hemovac x1.   TOURNIQUET TIME:  49 minutes at 300 mmHg.   COMPLICATIONS:  None.   CONDITION:  Stable to recovery.   BRIEF CLINICAL NOTE:  [name] [name] is a 77 year old male who has severe end-  stage arthritis of the right knee with intractable pain.  He has had a  previous left total knee arthroplasty which he has done very well with and  presents now for right total knee arthroplasty.   PROCEDURE IN DETAIL:  After successful administration of general anesthetic,  a tourniquet was placed high on his right thigh and the right lower  extremity prepped and draped in the usual sterile fashion.  The extremity  was wrapped in Esmarch and tourniquet inflated to 300 mmHg.  Standard  midline incision was made with a 10 blade through the subcutaneous tissue to  the level of the extensor mechanism.  A fresh blade was used to make a  medial parapatellar arthrotomy.  Then the soft tissue over the proximal  medial tibia was subperiosteally elevated to the joint line with a knife and  into the semimembranosus bursa with a Cobb elevator.  The soft tissue over  the proximal lateral tibia was also elevated with attention being paid to  avoiding the patellar tendon on the tibial tubercle.  The patella was  everted,  the knee 90 degrees, and ACL and PCL removed.  Drill was used to  create a starting hole in the distal femur.  The canal was irrigated.  A 5-  degree right valgus alignment guide was placed, and referencing off the  posterior condyle, rotation was marked and the lock pins removed 10-mm off  the distal femur.  Distal femoral resection was made with an oscillating  saw.  Sizing block was placed, and a size 5 is the most appropriate.  The  rotation is marked off the epicondylar axis.  The size 5 cutting block was  then placed and the anterior, posterior, and chamber cuts made.   The tibia was subluxed forward, and the menisci were removed. Extramedullary  tibial alignment guide was placed referencing proximally at the medial  aspect of the tibial tubercle and distally along the second metatarsal axis  and tibial crest.  Blocks and pins removed 10-mm off the non-deficient  lateral side.  Tibial resection was made with an oscillating saw.  Size 5  was the  most appropriate tibial component.  We did remove a large medial  osteophyte, and then the proximal tibia was prepared with a Modular drill  and keel punch for a size 5.  The femoral preparation was completed with the  intercondylar cut.   Size 5 mobile-bearing tibial trial with a size 5 posterior-stabilized  femoral trial and a 10-mm posterior-stabilized rotating platform insert  trail were placed.  With the 10, full extension was achieved with excellent  varus and valgus balance throughout, full range of motion.  The patella was  then everted, thickness measured to be 27 mm.  Free-hand resection was taken  to 15 mm, 41 template was placed, lug holes were drilled, trial patella was  placed, and it tracked normally.  The osteophytes were then removed off the  posterior femur with the trials in place.  All trials were removed, and the  cut bone surfaces were prepared with pulsatile lavage.  Cement was mixed and  once ready for implantation,  the size 5 mobile-bearing tibial tray, size 5  posterior-stabilized femur, and 41 patella were cemented into place, and  patella was held with a clamp.  Trial 10-mm insert was placed, the knee held  in full extension, and all extruded cement removed.  Once the cement was  fully hardened and the permanent 10-mm posterior-stabilized rotating  platform insert was placed into the tibial tray.  The wound was copiously  irrigated with saline solution and the extensor mechanism then closed over a  Hemovac drain with interrupted #1 PDS.  Flexion against gravity was 135  degrees.  The tourniquet was released for a total time of 49 minutes.  Subcutaneous tissue was closed with interrupted 2-0 Vicryl subcuticular  running 4-0 Monocryl.  The catheter for the Marcaine pain pump was then  placed and the pump initiated.  The Hemovac drain suction canister was  placed.  The incision was clean and dry and Steri-Strips and a bulky  dressing applied.  He was then placed into a knee immobilizer, awakened, and  transported to recovery in stable condition.      FA/MEDQ  D:  12/10/2004  T:  12/10/2004  Job:  [id number]

## 2011-01-04 NOTE — Discharge Summary (Signed)
[name] [name], [name] [name]             ACCOUNT NO.:  [id number]   MEDICAL RECORD NO.:  [id number]          PATIENT TYPE:  INP   LOCATION:  [id number]                         [facility]ACILITY:  [facility]   PHYSICIAN:  Gaynelle Arabian, M.D.    DATE OF BIRTH:  [birth year]   DATE OF ADMISSION:  12/10/2004  DATE OF DISCHARGE:  12/13/2004                                 DISCHARGE SUMMARY   ADMISSION DIAGNOSES:  1.  Osteoarthritis right knee.  2.  Hypertension.  3.  Non-insulin-dependent diabetes mellitus.  4.  Hyperlipidemia.  5.  Post surgical prominent renal insufficiency.   DISCHARGE DIAGNOSES:  1.  Osteoarthritis right knee status post right total knee arthroplasty.  2.  Mild postoperative blood loss anemia that did not require transfusion.  3.  Postoperative hyponatremia.  4.  Postoperative acute renal insufficiency, improved.   PROCEDURE:  December 10, 2004 right total knee arthroplasty; surgeon, Gaynelle Arabian, M.D.; assistant, Arlee Muslim, P.A.-C. Anesthesia general, postop  Marcaine pain pump. Tourniquet time 49 minutes at 300 mmHg.   CONSULTATIONS:  [place] hospitalist, Gwendolyn Grant, M.D.  and Rehab  Services.   LABORATORY DATA:  Preop CBC, hemoglobin 12.0, hematocrit of 36.1, white cell  count 3.5, differential within normal limits with the exception of elevated  eos of 6. Postop hemoglobin 11.4, last noted H&H 9.9 and 27.0. PT and PTT  12.4 and 30 respectively, INR of 0.9, serial pro times followed. Last noted  PT and INR 15.1 and 1.3. Chem panel on admission had a little elevated BUN  of 25 preoperatively with a glucose of 128, serial BMET's are followed.  Sodium did drop from 137 to 132 back up to 134. BUN and creatinine started  out at 25 and 1.5, went up to 32 and 2.1 back down to 22 and 1.5. Preop UA  negative. Followup UA large hemoglobin, small bili, trace ketones, small  leukocyte esterase with 7-10 white cells and many red cells. Blood group  type AB positive, urine culture taken on  December 10, 2004 no growth.   HOSPITAL COURSE:  Admitted to [facility], taken to the OR and  underwent the above stated procedure without complications. The patient  tolerated the procedure well and later transferred to the recovery room and  then to the orthopedic floor for continued postoperative care. Vital signs  were followed. History of renal insufficiency postoperatively. Internal  medicine services [place] hospitalist was consulted to assist with medical  management. The patient did have a little bit of low urinary output with  some hypotension noted postoperatively. Did not do too bed the evening of  surgery. The pain was in fairly decent control. Hemovac drain placed at the  time of surgery is pulled on day one. Did have a bump in his BUN and  creatinine felt to be in some mild acute renal insufficiency. Dr. Asa Lente  followed the patient throughout the hospital course. IV fluids were  continued and the BUN and creatinine was monitored closely. By day two, the  patient was feeling better, had been seen by rehab services and consult and  felt patient could benefit  from inpatient rehab versus a SACU stay. Felt to  be a good candidate. Will transfer the patient over at that time. Did have  some postoperative anemia with hemoglobin of 9.5 but was asymptomatic with  it. On day two, dressing change was removed, incision looked good, pain pump  was removed on day two. BUN and creatinine started to turn around with fluid  resuscitation and his renal insufficiency improved. Continued to progress  well and by day three he was already up ambulating 140 feet doing well from  a medical standpoint. There was a bed available on the rehab service, it was  decided the patient would be transferred over to the [facility]  unit.   PLAN:  1.  The patient transferred over to [facility] on December 13, 2004.  2.  Discharge diagnoses please see above.  3.  Discharge medications:  Continue current  medications as per the [facility].      This will be sent over with the patient.  4.  Diet, continue current diet.  5.  Activity, weightbearing as tolerated. PT and OT for gait training,      ambulation, ADL's for total knee protocol. May start showering. Daily      dressing changes.  6.  Followup two weeks from surgery following discharge from the [facility]  unit.   DISPOSITION:  [place] SACU .   CONDITION ON DISCHARGE:  Improving.      ALP/MEDQ  D:  01/18/2005  T:  01/18/2005  Job:  [id number]   cc:   Annie Main A. Sarajane Jews, M.D. [facility]   Rehab Services   Gwendolyn Grant, M.D. [facility]  [address] [place], [place] [address]

## 2011-01-04 NOTE — H&P (Signed)
NAMEANDREZ, SPIVA NO.:  [id number]   MEDICAL RECORD NO.:  [id number]          PATIENT TYPE:  ORB   LOCATION:  4533                         FACILITY:  [facility]   PHYSICIAN:  Jarvis Morgan, M.D.   DATE OF BIRTH:  [birth year]   DATE OF ADMISSION:  12/13/2004  DATE OF DISCHARGE:                                HISTORY & PHYSICAL   PRIMARY CARE PHYSICIAN:  Dr. Sarajane Jews at [facility].   ORTHOPEDIST:  Dr. Maureen Ralphs.   HISTORY OF PRESENT ILLNESS:  Mr. Payamps is a 77 year old adult male with  history of left total knee replacement in 2000. The patient was admitted to  [facility] December 10, 2004 with end-stage degenerative joint  disease of the right knee.  He had failed conservative care.  The patient  underwent a right total knee replacement December 10, 2004 by Dr. Maureen Ralphs.  Postoperatively he was placed on Coumadin for DVT prophylaxis.  Allowed  weight bearing as tolerated.  Pain control was initially maintained with PCA  pump which was discontinued December 12, 2004.  His Foley was removed December 12, 2004.  He has had mild elevation of blood pressure and Tenormin was  increased to 50 mg b.i.d. as of April 26.  He has denied chest pain and  nausea and vomiting.  He has a history of non-insulin-dependent diabetes  mellitus and his Glucophage was held December 11, 2004 for chronic renal  insufficiency with a creatinine of 2.1.  That has since decreased down to  1.5.   The patient was seen by rehabilitation medicine service and felt to be an  appropriate candidate for inpatient rehabilitation.  He is being moved to  the subacute care unit today for daily therapies to address mobility,  transfers, ambulation, and ADLs.   REVIEW OF SYSTEMS:  Positive for deafness, reflux, lumbago.   PAST MEDICAL HISTORY:  1.  Chronic renal insufficiency with baseline creatinine of 2.1.  2.  Hyperlipidemia.  3.  Glaucoma.  4.  Urinary retention.  5.  Peptic ulcer disease.  6.   Non-insulin-dependent diabetes mellitus.  7.  Prior hernia repair.  8.  Prior left neck lumpectomy.   FAMILY HISTORY:  Noncontributory..   SOCIAL HISTORY:  The patient is married and lives with his wife.  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  He has a level home with 5 steps to enter.  He does not use  alcohol or tobacco.   FUNCTIONAL HISTORY:  Independent prior to admission.   FUNCTIONAL STATUS:  Moderate assist bed mobility, moderate assist transfer,  min assist gait with rolling walker.   MEDICATIONS:  1.  Hydrocodone syrup.  2.  Metformin 500 mg daily.  3.  Hydrochlorothiazide 25 mg daily.  4.  Actos 45 mg daily.  5.  Atenolol 100 mg daily.  6.  Norvasc 10 mg daily.  7.  Flomax 0.4 mg daily.  8.  Chlor-Trimeton p.r.n.   PHYSICAL EXAMINATION:  Well appearing adult male lying in bed in mild to no  acute distress discomfort.  HEENT:  Normocephalic and atraumatic.  PULMONARY EXAM:  Lungs clear to auscultation bilaterally.  CARDIAC EXAM:  Regular rate and rhythm, S1, S2, without murmur.  ABDOMEN:  Soft, mildly obese, nontender, with positive bowel sounds.  RIGHT KNEE:  Examination of his right knee showed sutures in place with  Steri-strips and minimal drainage present.  NEUROLOGICAL EXAM:  Alert and oriented x3.  Cranial nerve exam was normal II-  XII.  BILATERAL UPPER EXTREMITY EXAM:  5/5 strength throughout.  Reflexes were 2+  and symmetrical.  Sensation was intact to light touch bilateral upper  extremities.  LEFT LOWER EXTREMITY:  5-/5 strength in hip flexion and extension and  __________ dorsiflexion.  There was well healed scar present on the left  knee region.  Sensation was intact to light touch throughout the left lower  extremity and reflexes were 2+ and symmetrical.  RIGHT LOWER EXTREMITY:  Dry dressing with Steri-Strips and sutures in place.  Range of motion was decreased in the right lower extremity.   Reflexes were  both 0-1 in the right knee and ankle with complaints of pain with ranging.  Sensation was intact to light touch throughout the right lower extremity.   IMPRESSION:  1.  Status post right total knee replacement, postoperative day No. 3, Dr.      Maureen Ralphs.  2.  Non-insulin-dependent diabetes mellitus.  3.  Chronic renal insufficiency.  4.  Hypertension.  5.  Postoperative anemia.   PLAN:  1.  Admit to inpatient therapies on subacute for daily therapies to improve      mobility, transfers and ambulation.  2.  Check admit labs in the morning including CMET and CBC.  3.  Continue in subacute Lovenox 30 mg q.12 hours until INR is consistently      greater than 2.0 on Coumadin.  4.  Monitor hypertension on Tenormin 60 mg b.i.d. and hold for systolic      blood pressure less than 110.  Also continue Norvasc 10 mg daily.  5.  Monitor diabetes mellitus on Actos 45 mg daily.  6.  Glucophage on hold secondary to renal insufficiency.  7.  Add Trinsicon 1 tablet b.i.d. for postoperative anemia.  8.  CTM machine 0-60 degrees, increase by 10 degrees daily to a max of 90      degrees.   ESTIMATED LENGTH OF STAY:  5-10 days.   PROGNOSIS:  Good.   GOALS:  Modified independent bed mobility, transfers, ambulation, and ADLs.   ADMISSION LABORATORIES:   ASSESSMENT AND PLAN:      DC/MEDQ  D:  12/13/2004  T:  12/13/2004  Job:  [id number]

## 2011-01-04 NOTE — Procedures (Signed)
NAMEWM, RIVIELLO NO.:  [id number]   MEDICAL RECORD NO.:  [id number]          PATIENT TYPE:  OUT   LOCATION:  SLEEP CENTER                 [facility]ACILITY[facility]  [facility]   PHYSICIAN:  Danton Sewer, M.D. [facility] DATE OF BIRTH:  Aug 06, 1929   DATE OF STUDY:                              NOCTURNAL POLYSOMNOGRAM   DATE OF STUDY:  September 08, 2005.   INDICATIONS FOR STUDY:  Hypersomnia with sleep apnea.   EPWORTH SCORE:  7.   SLEEP ARCHITECTURE:  The patient has a total sleep time of 336 minutes with  decreased REM, and he never achieved slow wave sleep. Sleep onset latency  was very rapid at 3 minutes as was REM onset at 61 minutes. Sleep efficiency  was fairly good at 90%.   RESPIRATORY DATA:  The patient was found to have 245 hypopneas and 124  apneas for a respiratory disturbance index of 67 events per hour. Events  were clearly worse during REM, and there was very loud snoring noted.   OXYGEN DATA:  The patient had O2 desaturation with his obstructive events as  low as 77%.   CARDIAC DATA:  No clinically significant cardiac arrhythmias.   MOVEMENT/PARASOMNIA:  No clinically some significant leg movements or  abnormal behaviors were noted.   IMPRESSION/RECOMMENDATIONS:  Severe obstructive sleep apnea/hypopnea  syndrome with a respiratory disturbance index of 67 events per hour and O2  desaturation as low as 77%. Treatment for this degree of sleep apnea should  focus primarily on weight loss as well as CPAP. The patient will ultimately  need pressure optimization.           ______________________________  Danton Sewer, M.D. [facility]  Diplomate, American Board of Sleep  Medicine     KC/MEDQ  D:  09/17/2005 11:53:59  T:  09/17/2005 21:15:35  Job:  [id number]

## 2011-01-04 NOTE — Op Note (Signed)
[place]  Patient:    [name] [name], [name] [name] Visit Number: [id number] MRN: [id number]          Service Type: DSU Location: [phone] Attending Physician:  Nolon Bussing Dictated by:   Nolon Bussing, M.D. Proc. Date: 09/28/01 Admit Date:  09/28/2001                             Operative Report  PREOPERATIVE DIAGNOSIS:  Macular hole left eye.  POSTOPERATIVE DIAGNOSIS:  Macular hole left eye.  OPERATION PERFORMED: 1. Posterior vitrectomy and membrane peel--internal limiting membrane    with ICG guidance. 2. Injection of vitreous substitute with SF6 20% os.  SURGEON:  Nolon Bussing, M.D.  ANESTHESIA:  Local retrobulbar with monitored anesthesia control.  INDICATIONS FOR PROCEDURE:  The patient is a 77 year old man with profound vision loss of the left eye on the basis of idiopathic stage 3 macular hole. This is an attempt to close the macular hole to allow chance for potential for increased visual acuity.  The patient understands the risks of anesthesia including the rare occurrence of death but also to the eye including hemorrhage, infection, scarring, need for another surgery, no change in vision, loss of vision, and progressive disease despite intervention.  DESCRIPTION OF PROCEDURE:  After appropriate signed consent was obtained, the patient was taken to the operating room where appropriate monitoring was followed by mild sedation.  0.75% Marcaine was then delivered 5 cc retrobulbar followed by an additional 5 cc laterally in the fashion of modified Kirk Ruths. The right periocular region was sterilely prepped and draped in the usual ophthalmic fashion.  A lid speculum was applied.  Conjunctival peritomy fashioned temporally and supranasally.  A 4 mm infusion was secured 3.5 mm posterior to the limbus in the infratemporal quadrant.  Placement in the vitreous cavity verified visually.  Superior sclerotomy was then  fashioned. Wild microscope placed in position with Biom attached.  Core vitrectomy was then begun.  Iatrogenic posterior vitreous detachment was necessary and created with active nasal suction out nasal to the optic nerve.  At this time vitreous skirt elevated 360 degrees.  Peripheral retina was inspected and found to be free of retinal holes or tears.  Fluid-air exchange was then completed.  Dilute ICG prepared as a normal solution diluted one part to 10 parts of balanced salt solution was then injected over the posterior pole and allowed to remain in place for approximately 15 seconds.  This was immediately removed passively.  At this time a fluid-air exchange was then completed. Substandard visualization of the internal limiting membrane was obtained. Attempts to grasp the membrane were not successful because of poor delineation.  For this reason, fluid-air exchange was then completed again. Dilute ICG again was placed for another 10 seconds.  This was then aspirated and the air-fluid exchange completed.  Under fluid the internal limiting membrane could be readily observed and see and was peeled in circumferential fashion out to the arcades and to the optic nerve 360 degrees. The edges to the macular hole were identified and the intraocular lens was removed.  Excellent mobilization of the macular hole edges was obtained.  At this time a fluid-air exchange was completed.  An air-SF6 20% exchange completed.  Superior sclerotomy was closed with 7-0 Vicryl suture.  Infusion removed and similarly closed with 7-0 Vicryl suture.  Conjunctiva closed with 7-0 Vicryl suture.  Subconjunctival injections of antibiotic  and steroid applied.  The patient tolerated the procedure without complications. Reaspiration of small silicone  oil bubbles in the posterior chamber was quite  At this time the superior sclerotomy was closed with 7-0 Vicryl suture. Subconjunctival injections of antibiotic and  steroid were applied.  The patient tolerated the procedure well without complication. Dictated by:   Nolon Bussing, M.D. Attending Physician:  Nolon Bussing DD:  09/28/01 TD:  09/28/01 Job: [id number]

## 2011-01-04 NOTE — H&P (Signed)
[name] [name], [name] [name]             ACCOUNT NO.:  [id number]   MEDICAL RECORD NO.:  [id number]          PATIENT TYPE:  INP   LOCATION:  NA                           FACILITY:  [facility]   PHYSICIAN:  Gaynelle Arabian, M.D.    DATE OF BIRTH:  [birth year]   DATE OF ADMISSION:  12/10/2004  DATE OF DISCHARGE:                                HISTORY & PHYSICAL   CHIEF COMPLAINT:  Right knee pain.   HISTORY OF PRESENT ILLNESS:  The patient is a 77 year old [name] who has been  seen by Dr. Pilar Plate Aluisio for ongoing knee pain. He has had a previous left  total knee and has been doing quite well with that, although he has known  end-stage arthritis of the right knee. He has been intractable nonoperative  management. He has done well with his previous knee. The knee is hurting bad  and he is at a stage where he would like to have something done about it. It  is felt that he would benefit from undergoing surgical intervention. Risks  and benefits discussed and the patient is subsequently admitted to the  hospital.   ALLERGIES:  VICODIN causes nausea.   CURRENT MEDICATIONS:  1.  Metformin 500 mg q.d.  2.  Hydrochlorothiazide 25 mg q.d.  3.  Actos 45 mg q.d.  4.  Atenolol 100 mg q.d.  5.  Norvasc 10 mg q.d.  6.  Flomax 4 mg q.d.  7.  Chlor-Trimeton 2 tablets q.d. during allergy season.   PAST MEDICAL HISTORY:  1.  Seasonal allergies.  2.  Hypertension.  3.  Noninsulin-dependent diabetes mellitus.  4.  Hyperlipidemia.  5.  Postsurgical chronic renal insufficiency.  6.  Osteoarthritis.   PAST SURGICAL HISTORY:  1.  Hernia repair on the right side in 1972.  2.  Left side neck lumpectomy December of 1999.  3.  Left total knee replacement April of 2000.   FAMILY HISTORY:  Mother is deceased at age 49 of unknown cause. Father is  deceased at age 43 from possible myocardial infarction.   SOCIAL HISTORY:  Married, retired, nonsmoker, no alcohol. Has six children.  His wife will be assisting with  care after surgery.   REVIEW OF SYMPTOMS:  GENERAL:  No fever, chills, or night sweats.  NEUROLOGICAL:  No seizures, syncope, or paralysis. RESPIRATORY:  No  shortness of breath, productive cough, or hemoptysis. CARDIOVASCULAR:  No  shortness of breath at rest, although he does have shortness of breath on  exertion. No chest pain or angina. GI:  No nausea, vomiting, diarrhea, or  constipation. GU:  Some nocturia. No dysuria, hematuria, or discharge.  MUSCULOSKELETAL:  Right knee as found in the history of present illness.   PHYSICAL EXAMINATION:  VITAL SIGNS:  Pulse 60, respirations 12, blood  pressure 136/69.  GENERAL:  He is a 77 year old [name] [name], well-developed, well-  nourished, large frame. He is alert, oriented, cooperative, and pleasant. He  is accompanied by his wife.  HEENT:  Normocephalic and atraumatic.  Pupils are equal, round, and  reactive. Oropharynx is clear. EOMs are intact.  NECK:  Supple. No bruits are appreciated.  HEENT:  Lower denture plates.  CHEST:  Clear anterior and posterior chest walls. No rhonchi, rales, or  wheezing.  HEART:  Regular rate and rhythm with a systolic ejection murmur noted over  the aortic point 2-3/6 graded.  ABDOMEN:  Soft and nontender. Bowel sounds present.  RECTAL:  Not done, not pertinent to present illness.  BREASTS:  Not done, not pertinet to present illness.  GENITALIA:  Not done, not pertinent to present illness.  EXTREMITIES:  Right knee shows a marked varus malalignment. Range of motion  of 10 to 115 degrees, marked crepitus is noted.   IMPRESSION:  1.  Osteoarthritis right knee.  2.  Hypertension.  3.  Noninsulin-dependent diabetes mellitus.  4.  Hyperlipidemia.  5.  Postsurgical chronic renal insufficiency.   PLAN:  The patient is admitted to [facility] to undergo a right  total knee arthroplasty. Surgery will be performed by Dr. Gaynelle Arabian. He  is seen preoperatively by Dr. Ardyth Gal. Spruill  and felt stable to proceed  with surgery.      ALP/MEDQ  D:  12/09/2004  T:  12/09/2004  Job:  [id number]   cc:   Annie Main A. Sarajane Jews, M.D. [facility]   Gaynelle Arabian, M.D.  [facility]  [address]  [place]  [place] [address]  Fax: [phone]

## 2011-01-04 NOTE — Assessment & Plan Note (Signed)
[facility] OFFICE NOTE   NAME:[name] [name], [name] [name]                    MRN:          [id number]  DATE:07/29/2006                            DOB:          [birth year]    REFERRING PHYSICIAN:  Ishmael Holter. Sarajane Jews, MD   REASON FOR CONSULTATION:  Melena.   HISTORY OF PRESENT ILLNESS:  Mr. [name] [name] is a 77 year old African-  American male referred through the courtesy of Dr. Alysia Penna.  He  relates a history of bleeding ulcers diagnosed about 15 years ago when  he lives in the [place] area.  He states he had endoscopies  performed at that time.  He does not recall whether it was a duodenal  ulcer or gastric ulcer.  About 10 days ago, he noted the onset of an  aching periumbilical abdominal pain associated with some mild heartburn  and nausea.  About 2-3 days after that, he had black tarry stools that  occurred for about 2-3 days and then stopped.  He had no vomiting,  hematochezia, dizziness, syncope, or weakness.  He was seen by Dr. Sarajane Jews  on December 4, and hemoccult-positive stool was noted.  He was started  on Protonix.  His periumbilical pain has resolved, and his bowel  movements returned to normal brown color.  He has occasional mild  constipation, but he has no other ongoing gastrointestinal complaints.  He takes and 81 mg aspirin on a daily basis for cardiovascular  prophylaxis and denies use of additional aspirin or other NSAID  products.  He is not taking acid suppressant on a regular basis.  He  does not know if he was treated for Helicobacter pylori in the past.  He  was previously seen by Dr. Katherina Mires, and he underwent colonoscopy for  hemoccult-positive stool on November 27, 2001, which revealed a sessile  polyp in the ascending colon which measured approximately 5 mm and was  an adenomatous polyp on pathology.  Dr. Junius Creamer reports stated that  there were 2-3 other polyps in the region that were not  removed. The  remainder of the colonoscopy was unremarkable.  He was advised to have a  colonoscopy at 3 years.   PAST MEDICAL HISTORY:  1. Hypertension.  2. Diabetes mellitus.  3. Arthritis.  4. Allergic rhinitis.  5. Sleep apnea.  6. Status post bilateral knee surgery.  7. Status post removal of a benign lesion from his neck in 2002.  8. Adenomatous colon polyp.   MEDICATION ALLERGIES:  MONOPRIL and DIOVAN.   CURRENT MEDICATIONS:  Listed on the chart updated and reviewed.   SOCIAL HISTORY AND REVIEW OF SYSTEMS:  Per the handwritten evaluation  form.   PHYSICAL EXAMINATION:  GENERAL:  Overweight, no acute distress.  VITAL SIGNS:  Height 6 feet 2 inches, weight 269 pounds. Blood pressure  144/78, pulse 72 and regular.  HEENT:  Anicteric sclerae.  Oropharynx clear.  CHEST: Clear to auscultation bilaterally.  CARDIAC:  Regular rate and rhythm without murmurs appreciated.  ABDOMEN:  Soft, nontender, nondistended.  Normoactive bowel sounds.  No  palpable organomegaly, masses, or hernias.  RECTAL: Examination deferred with the recent exam by Dr. Sarajane Jews showing no  lesions and hemoccult-negative stool.  EXTREMITIES: Without clubbing, cyanosis, or edema.  NEUROLOGIC: Alert and oriented x4.  Grossly nonfocal.   ASSESSMENT AND PLAN:  1. Recent history of melena with associated periumbilical abdominal      pain and hemoccult-positive stool documented.  Prior history of      bleeding ulcers.  I suspect he has recurrent ulcer disease.      Continue Protonix 40 mg p.o. q.a.m.  He will likely need a proton      pump inhibitor for ulcer prophylaxis if aspirin is used again long      term.  Obtain a CBC today.  Risks, benefits, and alternatives to      upper endoscopy with possible biopsy and possible control of      bleeding discussed with the patient, and he consents to proceed.      This is scheduled on 07/30/06. Obtain biopsy for Helicobacter      pylori, and if negative, consider blood  testing for Helicobacter      pylori.  2. Personal history of adenomatous colon polyps.  Two to three other      ascending colon polyps noted on prior exam and not removed.  Plan      for a colonoscopy once his upper gastrointestinal problems have      stabilized.  This will likely be done in January 2008.  Risks,      benefits, and alternatives of colonoscopy and possible biopsy and      possible polypectomy discussed with the patient, and he consents to      proceed.  This will be scheduled in the near future as above.     Pricilla Riffle. Fuller Plan, MD, [facility]  Electronically Signed    MTS/MedQ  DD: 07/29/2006  DT: 07/29/2006  Job #: [id number]   cc:   Annie Main A. Sarajane Jews, MD

## 2011-05-20 LAB — DIFFERENTIAL
Eosinophils Absolute: 0.1
Lymphocytes Relative: 24
Lymphs Abs: 0.9
Monocytes Relative: 13 — ABNORMAL HIGH
Neutro Abs: 2.2
Neutrophils Relative %: 58

## 2011-05-20 LAB — GLUCOSE, CAPILLARY: Glucose-Capillary: 112 — ABNORMAL HIGH

## 2011-05-20 LAB — CBC
MCV: 91.4
Platelets: 181
RBC: 3.78 — ABNORMAL LOW
WBC: 3.8 — ABNORMAL LOW

## 2011-05-20 LAB — B-NATRIURETIC PEPTIDE (CONVERTED LAB): Pro B Natriuretic peptide (BNP): 105 — ABNORMAL HIGH

## 2011-06-03 LAB — DIFFERENTIAL
Eosinophils Absolute: 0.2
Lymphs Abs: 1.1
Monocytes Absolute: 0.5
Monocytes Relative: 11
Neutrophils Relative %: 61

## 2011-06-03 LAB — LIPID PANEL
HDL: 40
LDL Cholesterol: 159 — ABNORMAL HIGH
Total CHOL/HDL Ratio: 6
Triglycerides: 193 — ABNORMAL HIGH
VLDL: 39

## 2011-06-03 LAB — HEMOGLOBIN A1C: Hgb A1c MFr Bld: 7 — ABNORMAL HIGH

## 2011-06-03 LAB — CBC
Hemoglobin: 11.9 — ABNORMAL LOW
MCV: 90.8
RBC: 4.01 — ABNORMAL LOW
WBC: 4.7

## 2011-06-03 LAB — BASIC METABOLIC PANEL
CO2: 27
Chloride: 103
Glucose, Bld: 129 — ABNORMAL HIGH
Potassium: 3.5
Sodium: 138

## 2011-06-03 LAB — POCT I-STAT CREATININE: Creatinine, Ser: 1.5

## 2011-06-03 LAB — HEPATIC FUNCTION PANEL
ALT: 16
Bilirubin, Direct: 0.1
Indirect Bilirubin: 1.1 — ABNORMAL HIGH

## 2011-06-03 LAB — I-STAT 8, (EC8 V) (CONVERTED LAB)
BUN: 31 — ABNORMAL HIGH
Chloride: 105
pCO2, Ven: 47.1
pH, Ven: 7.354 — ABNORMAL HIGH

## 2011-06-03 LAB — POCT CARDIAC MARKERS
CKMB, poc: <1 — ABNORMAL LOW
Myoglobin, poc: 116
Troponin i, poc: <0.05

## 2011-06-03 LAB — PROTIME-INR: INR: 1

## 2011-06-03 LAB — APTT: aPTT: 30

## 2011-08-29 ENCOUNTER — Other Ambulatory Visit (HOSPITAL_COMMUNITY): Payer: Self-pay | Admitting: Orthopedic Surgery

## 2011-08-29 DIAGNOSIS — M25561 Pain in right knee: Secondary | ICD-10-CM

## 2011-09-02 ENCOUNTER — Encounter (HOSPITAL_COMMUNITY)
Admission: RE | Admit: 2011-09-02 | Discharge: 2011-09-02 | Disposition: A | Discharge status: Home / Self Care | Payer: Medicare Other | Source: Ambulatory Visit | Attending: Orthopedic Surgery | Admitting: Orthopedic Surgery

## 2011-09-02 DIAGNOSIS — M25569 Pain in unspecified knee: Secondary | ICD-10-CM | POA: Insufficient documentation

## 2011-09-02 DIAGNOSIS — M25561 Pain in right knee: Secondary | ICD-10-CM

## 2011-09-02 DIAGNOSIS — Z96659 Presence of unspecified artificial knee joint: Secondary | ICD-10-CM | POA: Insufficient documentation

## 2011-09-02 DIAGNOSIS — R209 Unspecified disturbances of skin sensation: Secondary | ICD-10-CM | POA: Insufficient documentation

## 2011-09-02 MED ORDER — TECHNETIUM TC 99M MEDRONATE IV KIT
25.0000 | PACK | Freq: Once | INTRAVENOUS | Status: AC | PRN
  Administered 2011-09-02: 25 via INTRAVENOUS

## 2011-09-23 ENCOUNTER — Encounter: Payer: Self-pay | Admitting: Gastroenterology

## 2012-05-22 ENCOUNTER — Encounter: Payer: Self-pay | Admitting: Gastroenterology

## 2014-09-20 ENCOUNTER — Encounter (HOSPITAL_COMMUNITY): Payer: Self-pay | Admitting: Emergency Medicine

## 2014-09-20 ENCOUNTER — Emergency Department (HOSPITAL_COMMUNITY): Payer: Medicare Other

## 2014-09-20 ENCOUNTER — Inpatient Hospital Stay (HOSPITAL_COMMUNITY)
Admission: EM | Admit: 2014-09-20 | Discharge: 2014-09-24 | DRG: 292 | Disposition: A | Discharge status: Home / Self Care | Payer: Medicare Other | Attending: Internal Medicine | Admitting: Internal Medicine

## 2014-09-20 DIAGNOSIS — E785 Hyperlipidemia, unspecified: Secondary | ICD-10-CM | POA: Diagnosis present

## 2014-09-20 DIAGNOSIS — R079 Chest pain, unspecified: Secondary | ICD-10-CM | POA: Diagnosis present

## 2014-09-20 DIAGNOSIS — Z888 Allergy status to other drugs, medicaments and biological substances status: Secondary | ICD-10-CM

## 2014-09-20 DIAGNOSIS — I5031 Acute diastolic (congestive) heart failure: Secondary | ICD-10-CM | POA: Diagnosis not present

## 2014-09-20 DIAGNOSIS — E1121 Type 2 diabetes mellitus with diabetic nephropathy: Secondary | ICD-10-CM | POA: Diagnosis present

## 2014-09-20 DIAGNOSIS — Z794 Long term (current) use of insulin: Secondary | ICD-10-CM

## 2014-09-20 DIAGNOSIS — D696 Thrombocytopenia, unspecified: Secondary | ICD-10-CM | POA: Diagnosis present

## 2014-09-20 DIAGNOSIS — N400 Enlarged prostate without lower urinary tract symptoms: Secondary | ICD-10-CM | POA: Diagnosis present

## 2014-09-20 DIAGNOSIS — R531 Weakness: Secondary | ICD-10-CM

## 2014-09-20 DIAGNOSIS — N184 Chronic kidney disease, stage 4 (severe): Secondary | ICD-10-CM

## 2014-09-20 DIAGNOSIS — J309 Allergic rhinitis, unspecified: Secondary | ICD-10-CM | POA: Diagnosis present

## 2014-09-20 DIAGNOSIS — Z96653 Presence of artificial knee joint, bilateral: Secondary | ICD-10-CM | POA: Diagnosis present

## 2014-09-20 DIAGNOSIS — J810 Acute pulmonary edema: Secondary | ICD-10-CM

## 2014-09-20 DIAGNOSIS — M1990 Unspecified osteoarthritis, unspecified site: Secondary | ICD-10-CM | POA: Diagnosis present

## 2014-09-20 DIAGNOSIS — I739 Peripheral vascular disease, unspecified: Secondary | ICD-10-CM | POA: Diagnosis present

## 2014-09-20 DIAGNOSIS — I120 Hypertensive chronic kidney disease with stage 5 chronic kidney disease or end stage renal disease: Secondary | ICD-10-CM | POA: Diagnosis present

## 2014-09-20 DIAGNOSIS — I509 Heart failure, unspecified: Secondary | ICD-10-CM

## 2014-09-20 DIAGNOSIS — G4733 Obstructive sleep apnea (adult) (pediatric): Secondary | ICD-10-CM | POA: Diagnosis present

## 2014-09-20 DIAGNOSIS — N189 Chronic kidney disease, unspecified: Secondary | ICD-10-CM | POA: Insufficient documentation

## 2014-09-20 DIAGNOSIS — R0602 Shortness of breath: Secondary | ICD-10-CM | POA: Diagnosis not present

## 2014-09-20 DIAGNOSIS — Z7982 Long term (current) use of aspirin: Secondary | ICD-10-CM

## 2014-09-20 DIAGNOSIS — N185 Chronic kidney disease, stage 5: Secondary | ICD-10-CM | POA: Diagnosis present

## 2014-09-20 DIAGNOSIS — Z8673 Personal history of transient ischemic attack (TIA), and cerebral infarction without residual deficits: Secondary | ICD-10-CM

## 2014-09-20 DIAGNOSIS — E8770 Fluid overload, unspecified: Secondary | ICD-10-CM

## 2014-09-20 HISTORY — DX: Type 2 diabetes mellitus without complications: E11.9

## 2014-09-20 HISTORY — DX: Cerebral infarction, unspecified: I63.9

## 2014-09-20 HISTORY — DX: Essential (primary) hypertension: I10

## 2014-09-20 HISTORY — DX: Hyperlipidemia, unspecified: E78.5

## 2014-09-20 LAB — CBC
HEMATOCRIT: 30 % — AB (ref 39.0–52.0)
Hemoglobin: 9.7 g/dL — ABNORMAL LOW (ref 13.0–17.0)
MCH: 29.8 pg (ref 26.0–34.0)
MCHC: 32.3 g/dL (ref 30.0–36.0)
MCV: 92 fL (ref 78.0–100.0)
Platelets: 138 10*3/uL — ABNORMAL LOW (ref 150–400)
RBC: 3.26 MIL/uL — ABNORMAL LOW (ref 4.22–5.81)
RDW: 12.7 % (ref 11.5–15.5)
WBC: 2.8 10*3/uL — AB (ref 4.0–10.5)

## 2014-09-20 LAB — BASIC METABOLIC PANEL
ANION GAP: 8 (ref 5–15)
BUN: 74 mg/dL — AB (ref 6–23)
CHLORIDE: 106 mmol/L (ref 96–112)
CO2: 20 mmol/L (ref 19–32)
CREATININE: 3.6 mg/dL — AB (ref 0.50–1.35)
Calcium: 8.6 mg/dL (ref 8.4–10.5)
GFR calc non Af Amer: 14 mL/min — ABNORMAL LOW (ref >90)
GFR, EST AFRICAN AMERICAN: 17 mL/min — AB (ref >90)
GLUCOSE: 216 mg/dL — AB (ref 70–99)
Potassium: 5.1 mmol/L (ref 3.5–5.1)
SODIUM: 134 mmol/L — AB (ref 135–145)

## 2014-09-20 LAB — I-STAT TROPONIN, ED: Troponin i, poc: 0.01 ng/mL (ref 0.00–0.08)

## 2014-09-20 NOTE — ED Provider Notes (Signed)
CSN: [id number]     Arrival date & time 09/20/14  2210 History  This chart was scribed for Kalman Drape, MD by Delphia Grates, ED Scribe. This patient was seen in room D32C/D32C and the patient's care was started at 11:29 PM.   Chief Complaint  Patient presents with  . Chest Pain    The history is provided by the patient and a relative. No language interpreter was used.    HPI Comments: [name] [name] is a 81 y.o. male, with history of HTN, DM, HLD, and stroke, who presents to the Emergency Department complaining of intermittent left sided chest pain for the past 4 days, that has gotten worse today. Patient describes the pain as a pressure and burning sensation. Family reports the patient has been moving slow, sleeping more than usual, weak, and "wobbly". She states that she noticed the patient was disoriented and states the patient became lost on his way back from the hospital yesterday. She reports he has been living in the area for 20 years, is a deacon, and has visited this hospital several times in the past. She reports the patient has been complaining of palpitations 1 week ago, and reports a reduction in Minoxidil to 2.5mg  by nephrologist. She reports the patient was informed to come to the ED if palpitations return. There is also associated SOB and leg swelling. Patient has taken three 80 mg of Lasix today. Patient denies fever, nausea, vomiting, or diaphoresis. He denies history of CHF, heart catheterization, stent placement, CA, leukemia, or lymphoma.    Internal medicine, Nephrologist, and Dermatologist at [facility]  Past Medical History  Diagnosis Date  . Hypertension   . Diabetes mellitus without complication   . Hyperlipidemia   . Stroke     2009   Past Surgical History  Procedure Laterality Date  . Replacement total knee      Left and Right  . Neck surgery      lump removed   No family history on file. History  Substance Use Topics  . Smoking status: Never Smoker    . Smokeless tobacco: Never Used  . Alcohol Use: No    Review of Systems  Constitutional: Negative for fever and diaphoresis.  Respiratory: Positive for shortness of breath.   Cardiovascular: Positive for chest pain, palpitations and leg swelling.  Gastrointestinal: Negative for vomiting and diarrhea.      Allergies  Fosinopril sodium; Meloxicam; and Valsartan  Home Medications   Prior to Admission medications   Not on File   Triage Vitals: BP 124/90 mmHg  Temp(Src) 98.3 F (36.8 C) (Oral)  Resp 17  SpO2 97%  Physical Exam  Constitutional: He is oriented to person, place, and time. He appears well-developed and well-nourished. No distress.  HENT:  Head: Normocephalic and atraumatic.  Nose: Nose normal.  Mouth/Throat: Oropharynx is clear and moist.  Eyes: Conjunctivae and EOM are normal. Pupils are equal, round, and reactive to light.  Neck: Normal range of motion. Neck supple. No JVD present. No tracheal deviation present. No thyromegaly present.  Cardiovascular: Normal rate and intact distal pulses.  Exam reveals no gallop and no friction rub.   Murmur heard. Frequent extra beats  Pulmonary/Chest: Effort normal and breath sounds normal. No stridor. No respiratory distress. He has no wheezes. He has no rales. He exhibits no tenderness.  Abdominal: Soft. Bowel sounds are normal. He exhibits no distension and no mass. There is no tenderness. There is no rebound and no guarding.  Musculoskeletal:  Normal range of motion. He exhibits edema. He exhibits no tenderness.  Lymphadenopathy:    He has no cervical adenopathy.  Neurological: He is alert and oriented to person, place, and time. He displays normal reflexes. He exhibits normal muscle tone. Coordination normal.  Skin: Skin is warm and dry. No rash noted. No erythema. No pallor.  Psychiatric: He has a normal mood and affect. His behavior is normal. Judgment and thought content normal.  Nursing note and vitals  reviewed.   ED Course  Procedures (including critical care time)  DIAGNOSTIC STUDIES: Oxygen Saturation is 97% on room air, adequate by my interpretation.    COORDINATION OF CARE: At 2338 Discussed treatment plan with patient which includes possible admission. Patient agrees.   Labs Review Labs Reviewed  CBC - Abnormal; Notable for the following:    WBC 2.8 (*)    RBC 3.26 (*)    Hemoglobin 9.7 (*)    HCT 30.0 (*)    Platelets 138 (*)    All other components within normal limits  BASIC METABOLIC PANEL - Abnormal; Notable for the following:    Sodium 134 (*)    Glucose, Bld 216 (*)    BUN 74 (*)    Creatinine, Ser 3.60 (*)    GFR calc non Af Amer 14 (*)    GFR calc Af Amer 17 (*)    All other components within normal limits  BRAIN NATRIURETIC PEPTIDE  URINALYSIS, ROUTINE W REFLEX MICROSCOPIC  I-STAT TROPOININ, ED    Imaging Review Dg Chest 2 View  09/20/2014   CLINICAL DATA:  Acute onset of chest pain, shortness of breath and cough. Initial encounter.  EXAM: CHEST  2 VIEW  COMPARISON:  Chest radiograph from 06/14/2008  FINDINGS: The lungs are well-aerated. Vascular congestion is noted, with increased interstitial markings, which likely reflects mild interstitial edema. No pleural effusion or pneumothorax is seen.  The heart is borderline normal in size. No acute osseous abnormalities are seen.  IMPRESSION: Vascular congestion, with increased interstitial markings, likely reflecting mild interstitial edema.   Electronically Signed   By: Garald Balding M.D.   On: 09/20/2014 22:39     EKG Interpretation   Date/Time:  Tuesday September 20 2014 22:19:50 EST Ventricular Rate:  77 PR Interval:  222 QRS Duration: 100 QT Interval:  422 QTC Calculation: 477 R Axis:   37 Text Interpretation:  Sinus rhythm with 1st degree A-V block with frequent  Premature ventricular complexes T wave abnormality, consider lateral  ischemia Prolonged QT Abnormal ECG PVC new from prior, lateral  twave  inersion unchanged from prior Confirmed by Yania Bogie  MD, Mekhi Lascola ([id number]) on  09/20/2014 11:27:01 PM      MDM   Final diagnoses:  Chest pain, unspecified chest pain type  Chronic renal failure syndrome, stage 4 (severe)  Acute pulmonary edema  Weakness    81 year old male with palpitations, burning chest pressure intermittently for the last 4 days, worse over the last day associated with weakness.  Patient has history of hypertension, diabetes, hyperlipidemia and stroke.  Patient is followed mainly at [facility].  EKG with frequent PVCs.  Chest x-ray with vascular congestion.  He had his daughter report increased swelling in his lower extremities.  No prior history of congestive heart failure.  He is unsure if he has had a heart catheterization in the past.  I personally performed the services described in this documentation, which was scribed in my presence. The recorded information has been reviewed and is  accurate.     Kalman Drape, MD 09/21/14 [phone]

## 2014-09-20 NOTE — ED Notes (Signed)
Patient reports having palpitations x3 days, and left sided chest tightness that started today. Denies radiation. Denies N/V or diaphoresis, but states he does have some SOB with symptoms.

## 2014-09-21 ENCOUNTER — Encounter (HOSPITAL_COMMUNITY): Payer: Self-pay | Admitting: Neurology

## 2014-09-21 DIAGNOSIS — Z888 Allergy status to other drugs, medicaments and biological substances status: Secondary | ICD-10-CM | POA: Diagnosis not present

## 2014-09-21 DIAGNOSIS — N185 Chronic kidney disease, stage 5: Secondary | ICD-10-CM | POA: Diagnosis present

## 2014-09-21 DIAGNOSIS — R0602 Shortness of breath: Secondary | ICD-10-CM | POA: Diagnosis present

## 2014-09-21 DIAGNOSIS — G4733 Obstructive sleep apnea (adult) (pediatric): Secondary | ICD-10-CM | POA: Diagnosis present

## 2014-09-21 DIAGNOSIS — I5031 Acute diastolic (congestive) heart failure: Secondary | ICD-10-CM

## 2014-09-21 DIAGNOSIS — Z794 Long term (current) use of insulin: Secondary | ICD-10-CM | POA: Diagnosis not present

## 2014-09-21 DIAGNOSIS — I509 Heart failure, unspecified: Secondary | ICD-10-CM

## 2014-09-21 DIAGNOSIS — R531 Weakness: Secondary | ICD-10-CM | POA: Insufficient documentation

## 2014-09-21 DIAGNOSIS — D696 Thrombocytopenia, unspecified: Secondary | ICD-10-CM | POA: Diagnosis present

## 2014-09-21 DIAGNOSIS — E8770 Fluid overload, unspecified: Secondary | ICD-10-CM

## 2014-09-21 DIAGNOSIS — R079 Chest pain, unspecified: Secondary | ICD-10-CM | POA: Insufficient documentation

## 2014-09-21 DIAGNOSIS — Z8673 Personal history of transient ischemic attack (TIA), and cerebral infarction without residual deficits: Secondary | ICD-10-CM | POA: Diagnosis not present

## 2014-09-21 DIAGNOSIS — I517 Cardiomegaly: Secondary | ICD-10-CM | POA: Diagnosis not present

## 2014-09-21 DIAGNOSIS — E1121 Type 2 diabetes mellitus with diabetic nephropathy: Secondary | ICD-10-CM | POA: Diagnosis present

## 2014-09-21 DIAGNOSIS — Z7982 Long term (current) use of aspirin: Secondary | ICD-10-CM | POA: Diagnosis not present

## 2014-09-21 DIAGNOSIS — I120 Hypertensive chronic kidney disease with stage 5 chronic kidney disease or end stage renal disease: Secondary | ICD-10-CM | POA: Diagnosis present

## 2014-09-21 DIAGNOSIS — I739 Peripheral vascular disease, unspecified: Secondary | ICD-10-CM | POA: Diagnosis present

## 2014-09-21 DIAGNOSIS — N400 Enlarged prostate without lower urinary tract symptoms: Secondary | ICD-10-CM | POA: Diagnosis present

## 2014-09-21 DIAGNOSIS — J309 Allergic rhinitis, unspecified: Secondary | ICD-10-CM | POA: Diagnosis present

## 2014-09-21 DIAGNOSIS — N189 Chronic kidney disease, unspecified: Secondary | ICD-10-CM | POA: Insufficient documentation

## 2014-09-21 DIAGNOSIS — M1990 Unspecified osteoarthritis, unspecified site: Secondary | ICD-10-CM | POA: Diagnosis present

## 2014-09-21 DIAGNOSIS — E785 Hyperlipidemia, unspecified: Secondary | ICD-10-CM | POA: Diagnosis present

## 2014-09-21 DIAGNOSIS — Z96653 Presence of artificial knee joint, bilateral: Secondary | ICD-10-CM | POA: Diagnosis present

## 2014-09-21 LAB — COMPREHENSIVE METABOLIC PANEL
ALT: 19 U/L (ref 0–53)
ANION GAP: 8 (ref 5–15)
AST: 14 U/L (ref 0–37)
Albumin: 3.8 g/dL (ref 3.5–5.2)
Alkaline Phosphatase: 51 U/L (ref 39–117)
BILIRUBIN TOTAL: 0.4 mg/dL (ref 0.3–1.2)
BUN: 73 mg/dL — ABNORMAL HIGH (ref 6–23)
CALCIUM: 8.6 mg/dL (ref 8.4–10.5)
CO2: 22 mmol/L (ref 19–32)
Chloride: 108 mmol/L (ref 96–112)
Creatinine, Ser: 3.54 mg/dL — ABNORMAL HIGH (ref 0.50–1.35)
GFR calc Af Amer: 17 mL/min — ABNORMAL LOW (ref >90)
GFR calc non Af Amer: 15 mL/min — ABNORMAL LOW (ref >90)
Glucose, Bld: 123 mg/dL — ABNORMAL HIGH (ref 70–99)
POTASSIUM: 5 mmol/L (ref 3.5–5.1)
Sodium: 138 mmol/L (ref 135–145)
Total Protein: 6.8 g/dL (ref 6.0–8.3)

## 2014-09-21 LAB — CBC WITH DIFFERENTIAL/PLATELET
Basophils Absolute: 0 10*3/uL (ref 0.0–0.1)
Basophils Relative: 0 % (ref 0–1)
EOS ABS: 0.2 10*3/uL (ref 0.0–0.7)
Eosinophils Relative: 7 % — ABNORMAL HIGH (ref 0–5)
HEMATOCRIT: 28.7 % — AB (ref 39.0–52.0)
Hemoglobin: 9.2 g/dL — ABNORMAL LOW (ref 13.0–17.0)
Lymphocytes Relative: 32 % (ref 12–46)
Lymphs Abs: 0.9 10*3/uL (ref 0.7–4.0)
MCH: 29.4 pg (ref 26.0–34.0)
MCHC: 32.1 g/dL (ref 30.0–36.0)
MCV: 91.7 fL (ref 78.0–100.0)
MONO ABS: 0.3 10*3/uL (ref 0.1–1.0)
MONOS PCT: 12 % (ref 3–12)
NEUTROS PCT: 49 % (ref 43–77)
Neutro Abs: 1.4 10*3/uL — ABNORMAL LOW (ref 1.7–7.7)
Platelets: 142 10*3/uL — ABNORMAL LOW (ref 150–400)
RBC: 3.13 MIL/uL — ABNORMAL LOW (ref 4.22–5.81)
RDW: 12.7 % (ref 11.5–15.5)
WBC: 2.9 10*3/uL — ABNORMAL LOW (ref 4.0–10.5)

## 2014-09-21 LAB — GLUCOSE, CAPILLARY
GLUCOSE-CAPILLARY: 120 mg/dL — AB (ref 70–99)
GLUCOSE-CAPILLARY: 229 mg/dL — AB (ref 70–99)
Glucose-Capillary: 98 mg/dL (ref 70–99)
Glucose-Capillary: 91 mg/dL (ref 70–99)
Glucose-Capillary: 130 mg/dL — ABNORMAL HIGH (ref 70–99)

## 2014-09-21 LAB — URINALYSIS, ROUTINE W REFLEX MICROSCOPIC
Bilirubin Urine: NEGATIVE
GLUCOSE, UA: NEGATIVE mg/dL
Hgb urine dipstick: NEGATIVE
Ketones, ur: NEGATIVE mg/dL
LEUKOCYTES UA: NEGATIVE
Nitrite: NEGATIVE
PH: 6.5 (ref 5.0–8.0)
PROTEIN: 30 mg/dL — AB
SPECIFIC GRAVITY, URINE: 1.012 (ref 1.005–1.030)
UROBILINOGEN UA: 1 mg/dL (ref 0.0–1.0)

## 2014-09-21 LAB — URINE MICROSCOPIC-ADD ON: Urine-Other: NONE SEEN

## 2014-09-21 LAB — LIPID PANEL
CHOL/HDL RATIO: 2.9 ratio
Cholesterol: 132 mg/dL (ref 0–200)
HDL: 46 mg/dL (ref >39)
LDL CALC: 78 mg/dL (ref 0–99)
Triglycerides: 42 mg/dL (ref <150)
VLDL: 8 mg/dL (ref 0–40)

## 2014-09-21 LAB — TROPONIN I
Troponin I: <0.03 ng/mL (ref <0.031)
Troponin I: <0.03 ng/mL (ref <0.031)
Troponin I: <0.03 ng/mL (ref <0.031)

## 2014-09-21 LAB — BRAIN NATRIURETIC PEPTIDE
B NATRIURETIC PEPTIDE 5: 560.1 pg/mL — AB (ref 0.0–100.0)
B NATRIURETIC PEPTIDE 5: 701.1 pg/mL — AB (ref 0.0–100.0)

## 2014-09-21 LAB — TSH: TSH: 2.131 u[IU]/mL (ref 0.350–4.500)

## 2014-09-21 MED ORDER — INSULIN ASPART 100 UNIT/ML ~~LOC~~ SOLN
0.0000 [IU] | Freq: Three times a day (TID) | SUBCUTANEOUS | Status: DC
  Administered 2014-09-21: 3 [IU] via SUBCUTANEOUS
  Administered 2014-09-22: 1 [IU] via SUBCUTANEOUS
  Administered 2014-09-22 (×2): 2 [IU] via SUBCUTANEOUS
  Administered 2014-09-23 – 2014-09-24 (×4): 1 [IU] via SUBCUTANEOUS

## 2014-09-21 MED ORDER — TAMSULOSIN HCL 0.4 MG PO CAPS
0.4000 mg | ORAL_CAPSULE | Freq: Every day | ORAL | Status: DC
  Administered 2014-09-21 – 2014-09-24 (×4): 0.4 mg via ORAL
  Filled 2014-09-21 (×4): qty 1

## 2014-09-21 MED ORDER — PRIMIDONE 50 MG PO TABS
50.0000 mg | ORAL_TABLET | Freq: Three times a day (TID) | ORAL | Status: DC
  Administered 2014-09-21 – 2014-09-24 (×10): 50 mg via ORAL
  Filled 2014-09-21 (×12): qty 1

## 2014-09-21 MED ORDER — NITROGLYCERIN 0.4 MG SL SUBL
0.4000 mg | SUBLINGUAL_TABLET | SUBLINGUAL | Status: AC | PRN
  Administered 2014-09-21 (×3): 0.4 mg via SUBLINGUAL
  Filled 2014-09-21: qty 1

## 2014-09-21 MED ORDER — SODIUM CHLORIDE 0.9 % IJ SOLN: 3.0000 mL | INTRAMUSCULAR | Status: DC | PRN

## 2014-09-21 MED ORDER — INSULIN ASPART 100 UNIT/ML ~~LOC~~ SOLN: 0.0000 [IU] | SUBCUTANEOUS | Status: DC

## 2014-09-21 MED ORDER — HYDRALAZINE HCL 50 MG PO TABS
100.0000 mg | ORAL_TABLET | Freq: Three times a day (TID) | ORAL | Status: DC
  Administered 2014-09-21 – 2014-09-24 (×10): 100 mg via ORAL
  Filled 2014-09-21 (×13): qty 2

## 2014-09-21 MED ORDER — MORPHINE SULFATE 2 MG/ML IJ SOLN
2.0000 mg | Freq: Once | INTRAMUSCULAR | Status: AC
  Administered 2014-09-21: 2 mg via INTRAVENOUS
  Filled 2014-09-21: qty 1

## 2014-09-21 MED ORDER — NITROGLYCERIN 2 % TD OINT
1.0000 [in_us] | TOPICAL_OINTMENT | Freq: Four times a day (QID) | TRANSDERMAL | Status: DC
  Administered 2014-09-21 – 2014-09-22 (×6): 1 [in_us] via TOPICAL
  Filled 2014-09-21 (×5): qty 30

## 2014-09-21 MED ORDER — SODIUM CHLORIDE 0.9 % IJ SOLN
3.0000 mL | Freq: Two times a day (BID) | INTRAMUSCULAR | Status: DC
  Administered 2014-09-21 – 2014-09-24 (×8): 3 mL via INTRAVENOUS

## 2014-09-21 MED ORDER — CALCITRIOL 0.5 MCG PO CAPS
1.0000 ug | ORAL_CAPSULE | ORAL | Status: DC
  Administered 2014-09-21 – 2014-09-23 (×2): 1 ug via ORAL
  Filled 2014-09-21 (×2): qty 2

## 2014-09-21 MED ORDER — FUROSEMIDE 10 MG/ML IJ SOLN
80.0000 mg | Freq: Two times a day (BID) | INTRAMUSCULAR | Status: DC
  Administered 2014-09-21 – 2014-09-22 (×2): 80 mg via INTRAVENOUS
  Filled 2014-09-21 (×5): qty 8

## 2014-09-21 MED ORDER — SODIUM CHLORIDE 0.9 % IV SOLN: 250.0000 mL | INTRAVENOUS | Status: DC | PRN

## 2014-09-21 MED ORDER — FUROSEMIDE 10 MG/ML IJ SOLN
80.0000 mg | Freq: Once | INTRAMUSCULAR | Status: AC
  Administered 2014-09-21: 60 mg via INTRAVENOUS
  Filled 2014-09-21: qty 8

## 2014-09-21 MED ORDER — LABETALOL HCL 200 MG PO TABS
400.0000 mg | ORAL_TABLET | Freq: Three times a day (TID) | ORAL | Status: DC
  Administered 2014-09-21 – 2014-09-24 (×10): 400 mg via ORAL
  Filled 2014-09-21 (×12): qty 2

## 2014-09-21 MED ORDER — PRAVASTATIN SODIUM 40 MG PO TABS
40.0000 mg | ORAL_TABLET | Freq: Every day | ORAL | Status: DC
  Administered 2014-09-21 – 2014-09-24 (×4): 40 mg via ORAL
  Filled 2014-09-21 (×4): qty 1

## 2014-09-21 MED ORDER — CLONIDINE HCL 0.1 MG PO TABS
0.1000 mg | ORAL_TABLET | Freq: Three times a day (TID) | ORAL | Status: DC
  Administered 2014-09-21 – 2014-09-23 (×7): 0.1 mg via ORAL
  Filled 2014-09-21 (×9): qty 1

## 2014-09-21 MED ORDER — SPIRONOLACTONE 25 MG PO TABS
25.0000 mg | ORAL_TABLET | Freq: Every day | ORAL | Status: DC
  Administered 2014-09-21 – 2014-09-23 (×3): 25 mg via ORAL
  Filled 2014-09-21 (×3): qty 1

## 2014-09-21 MED ORDER — HEPARIN SODIUM (PORCINE) 5000 UNIT/ML IJ SOLN
5000.0000 [IU] | Freq: Three times a day (TID) | INTRAMUSCULAR | Status: DC
  Administered 2014-09-21 – 2014-09-24 (×10): 5000 [IU] via SUBCUTANEOUS
  Filled 2014-09-21 (×12): qty 1

## 2014-09-21 MED ORDER — PANTOPRAZOLE SODIUM 40 MG PO TBEC
80.0000 mg | DELAYED_RELEASE_TABLET | Freq: Every day | ORAL | Status: DC
  Administered 2014-09-21 – 2014-09-24 (×4): 80 mg via ORAL
  Filled 2014-09-21 (×4): qty 2

## 2014-09-21 MED ORDER — SODIUM CHLORIDE 0.9 % IJ SOLN
3.0000 mL | Freq: Two times a day (BID) | INTRAMUSCULAR | Status: DC
  Administered 2014-09-21 (×2): 3 mL via INTRAVENOUS

## 2014-09-21 MED ORDER — MINOXIDIL 2.5 MG PO TABS
2.5000 mg | ORAL_TABLET | Freq: Every day | ORAL | Status: DC
  Administered 2014-09-21 – 2014-09-22 (×2): 2.5 mg via ORAL
  Filled 2014-09-21 (×2): qty 1

## 2014-09-21 MED ORDER — ASPIRIN EC 325 MG PO TBEC
325.0000 mg | DELAYED_RELEASE_TABLET | Freq: Every day | ORAL | Status: DC
  Administered 2014-09-21 – 2014-09-24 (×4): 325 mg via ORAL
  Filled 2014-09-21 (×4): qty 1

## 2014-09-21 MED ORDER — AMLODIPINE BESYLATE 10 MG PO TABS
10.0000 mg | ORAL_TABLET | Freq: Every day | ORAL | Status: DC
  Administered 2014-09-21 – 2014-09-24 (×4): 10 mg via ORAL
  Filled 2014-09-21 (×4): qty 1

## 2014-09-21 MED ORDER — TRAMADOL HCL 50 MG PO TABS
50.0000 mg | ORAL_TABLET | Freq: Four times a day (QID) | ORAL | Status: DC | PRN
  Administered 2014-09-23 – 2014-09-24 (×2): 50 mg via ORAL
  Filled 2014-09-21 (×2): qty 1

## 2014-09-21 NOTE — Progress Notes (Signed)
  Echocardiogram 2D Echocardiogram has been performed.  Donata Clay 09/21/2014, 9:35 AM

## 2014-09-21 NOTE — Progress Notes (Signed)
PROGRESS NOTE  [name] [name] [id number] DOB: Oct 17, 1928 DOA: 09/20/2014 PCP: Pcp Not In System Brief history 81 year old male with a history of CKD stage V, hypertension, diabetes mellitus, and hyperlipidemia presented with one-week history of shortness of breath and worsening lower extremity edema. The patient had complained of worsening dyspnea on exertion for 2 days prior to admission. He also complained of worsening orthopnea. He also began complaining of chest discomfort one day prior to admission. Upon admission, chest x-ray revealed increasing vascular congestion and interstitial markings. BNP was 560. The patient was started on intravenous furosemide. Troponins were cycled for his chest pain. Assessment/Plan: Acute diastolic CHF -Restart intravenous furosemide 80 mg twice a day -Daily weights -Fluid restrict -Echocardiogram--EF 65-70 percent, grade 1 diastolic dysfunction, no WMA Atypical chest pain -Troponins negative 3 Malignant hypertension -Continue amlodipine, clonidine, hydralazine, labetalol, minoxidil, Aldactone, -Discontinue nitroglycerin patch -Start Imdur -Increase clonidine  CKD stage IV-5  -Worsening creatinine when compared to 2011 likely represents progression of the patient's renal disease  -Monitor renal function on furosemide  Diabetes mellitus type 2  -Hemoglobin A1c--pending -NovoLog sliding scale  Thrombocytopenia  -Chronic  -No signs of active bleeding     Family Communication:   Pt at beside Disposition Plan:   Home when medically stable       Procedures/Studies: Dg Chest 2 View  09/20/2014   CLINICAL DATA:  Acute onset of chest pain, shortness of breath and cough. Initial encounter.  EXAM: CHEST  2 VIEW  COMPARISON:  Chest radiograph from 06/14/2008  FINDINGS: The lungs are well-aerated. Vascular congestion is noted, with increased interstitial markings, which likely reflects mild interstitial edema. No pleural effusion or  pneumothorax is seen.  The heart is borderline normal in size. No acute osseous abnormalities are seen.  IMPRESSION: Vascular congestion, with increased interstitial markings, likely reflecting mild interstitial edema.   Electronically Signed   By: Garald Balding M.D.   On: 09/20/2014 22:39         Subjective: Patient feels he is breathing better but still shortness of breath with exertion. Denies any fevers, chills, chest pain, nausea, vomiting, diarrhea, abdominal pain, dysuria, hematuria.   Objective: Filed Vitals:   09/21/14 0230 09/21/14 0301 09/21/14 0629 09/21/14 0719  BP: 154/68 182/73 164/63   Pulse: 61 63 69 91  Temp:  97.5 F (36.4 C) 97.4 F (36.3 C)   TempSrc:  Oral Oral   Resp: 12 18 18    Height:  6\' 1"  (1.854 m)    Weight:  121.2 kg (267 lb 3.2 oz)    SpO2: 98% 100% 100%     Intake/Output Summary (Last 24 hours) at 09/21/14 1518 Last data filed at 09/21/14 1047  Gross per 24 hour  Intake    240 ml  Output   2025 ml  Net  -1785 ml   Weight change:  Exam:   General:  Pt is alert, follows commands appropriately, not in acute distress  HEENT: No icterus, No thrush,  [place]/AT  Cardiovascular: RRR, S1/S2, no rubs, no gallops, +JVD  Respiratory:    bibasilar crackles. No wheeze   Abdomen: Soft/+BS, non tender, non distended, no guarding  Extremities: 1+LE edema, No lymphangitis, No petechiae, No rashes, no synovitis  Data Reviewed: Basic Metabolic Panel:  Recent Labs Lab 09/20/14 2224 09/21/14 0219  NA 134* 138  K 5.1 5.0  CL 106 108  CO2 20 22  GLUCOSE 216* 123*  BUN 74* 73*  CREATININE 3.60* 3.54*  CALCIUM 8.6 8.6   Liver Function Tests:  Recent Labs Lab 09/21/14 0219  AST 14  ALT 19  ALKPHOS 51  BILITOT 0.4  PROT 6.8  ALBUMIN 3.8   No results for input(s): LIPASE, AMYLASE in the last 168 hours. No results for input(s): AMMONIA in the last 168 hours. CBC:  Recent Labs Lab 09/20/14 2224 09/21/14 0219  WBC 2.8* 2.9*    NEUTROABS  --  1.4*  HGB 9.7* 9.2*  HCT 30.0* 28.7*  MCV 92.0 91.7  PLT 138* 142*   Cardiac Enzymes:  Recent Labs Lab 09/21/14 0219 09/21/14 0803 09/21/14 1333  TROPONINI <0.03 <0.03 <0.03   BNP: Invalid input(s): POCBNP CBG:  Recent Labs Lab 09/21/14 0450 09/21/14 0756 09/21/14 1142  GLUCAP 98 91 120*    No results found for this or any previous visit (from the past 240 hour(s)).   Scheduled Meds: . amLODipine  10 mg Oral Daily  . aspirin EC  325 mg Oral Daily  . calcitRIOL  1 mcg Oral Q M,W,F  . cloNIDine  0.1 mg Oral TID  . furosemide  80 mg Intravenous BID  . heparin  5,000 Units Subcutaneous 3 times per day  . hydrALAZINE  100 mg Oral TID  . insulin aspart  0-9 Units Subcutaneous TID WC  . labetalol  400 mg Oral TID  . minoxidil  2.5 mg Oral Daily  . nitroGLYCERIN  1 inch Topical 4 times per day  . pantoprazole  80 mg Oral Daily  . pravastatin  40 mg Oral Daily  . primidone  50 mg Oral TID  . sodium chloride  3 mL Intravenous Q12H  . sodium chloride  3 mL Intravenous Q12H  . spironolactone  25 mg Oral Daily  . tamsulosin  0.4 mg Oral Daily   Continuous Infusions:    Kambrey Hagger, DO  [facility] Pager [phone]  If 7PM-7AM, please contact night-coverage [email or web address] Password [email or web address] 09/21/2014, 3:18 PM   LOS: 1 day

## 2014-09-21 NOTE — H&P (Addendum)
Hospitalist Admission History and Physical  Patient name: [name] [name] record number: [id number] Date of birth: 1929/06/21 Age: 81 y.o. Gender: male  Primary Care Provider: Pcp Not In System  Chief Complaint: chest pain, volume overload, AoCKI   History of Present Illness:This is a 81 y.o. year old male with significant past medical history of stage V CKD, malignanct HTN, type 2 DM presenting with chest pain, volume overload, AoCKI. Primarily followed at [facility]. Central chest pain x1 day. Constant. No radiaction to neck, jaw, shoulder. + mild diaphoresis. Take lasix 80 am, 40 pm. Has had worsening LE swelling, orthopnea, PND despite diuretic use. Pt denies NSAID use. Daughter at bedside. Reports pt w/ high salt intake.  Presents to ER T 98.3, HR 50s-60s, resp 10s, BP 120s-170s, 97% on RA. WBC 2.8, hgb 9.7, Cr 3.6, glu 216. Trop neg x1. EKG. SL NTG x1 w/ no improvement in sxs. Denies any prior hx/o coronary disease   Assessment and Plan: [name] [name] is a 81 y.o. year old male presenting with Chest Pain, Volume Overload   Active Problems:   Chest pain   Volume overload  1- Chest Pain  -mixed sxs in pt w/ multiple CV risk factors -likely component of volume overload and HTN -cycle CEs -full dose ASA -prn NTG -tele bed -cards consult in am   2-Volume Overload  -likely progression of CKD -2D ECHO  -IV lasix  -strict Is and Os and daily weights -follow UOP  3- AoCKI  -end stage disease based on GFR  -IV lasix  -cont home regimen  -?HD -renal consult in am   4-HTN -malignant  -? Volume component  -cont home regimen -titrate as clinically indicated  5-DM -SSI  -A1C  FEN/GI: renal diet Prophylaxis: sub q heparin Disposition: pending further evaluation  Code Status:Full Code    Patient Active Problem List   Diagnosis Date Noted  . Chest pain 09/21/2014  . Volume overload 09/21/2014  . COUGH 04/19/2009  . AORTIC VALVE SCLEROSIS  02/24/2009  . PERIPHERAL EDEMA 11/29/2008  . THROMBOCYTOPENIA 11/25/2008  . PVD 11/25/2008  . RESTING TREMOR 11/11/2008  . BENIGN PROSTATIC HYPERTROPHY, WITH OBSTRUCTION 07/04/2008  . ASTHMA 07/17/2007  . OBSTRUCTIVE SLEEP APNEA 06/22/2007  . Acute Bronchitis 06/15/2007  . OVERACTIVE BLADDER 06/15/2007  . CEREBROVASCULAR ACCIDENT, HX OF 03/30/2007  . DIABETES MELLITUS, TYPE II 03/23/2007  . HYPERLIPIDEMIA 03/23/2007  . HYPERTENSION 03/23/2007  . ALLERGIC RHINITIS 03/23/2007  . DEGENERATIVE JOINT DISEASE 03/23/2007  . HEADACHE 03/23/2007   Past Medical History: Past Medical History  Diagnosis Date  . Hypertension   . Diabetes mellitus without complication   . Hyperlipidemia   . Stroke     2009    Past Surgical History: Past Surgical History  Procedure Laterality Date  . Replacement total knee      Left and Right  . Neck surgery      lump removed    Social History: History   Social History  . Marital Status: Married    Spouse Name: N/A    Number of Children: N/A  . Years of Education: N/A   Social History Main Topics  . Smoking status: Never Smoker   . Smokeless tobacco: Never Used  . Alcohol Use: No  . Drug Use: No  . Sexual Activity: None   Other Topics Concern  . None   Social History Narrative  . None    Family History: No family history on file.  Allergies: Allergies  Allergen Reactions  . Fosinopril Sodium Other (See Comments)    Unknown   . Meloxicam     REACTION: stomah pains, nausea, constipation  . Valsartan Other (See Comments)    unknown    Current Facility-Administered Medications  Medication Dose Route Frequency Provider Last Rate Last Dose  . 0.9 %  sodium chloride infusion  250 mL Intravenous PRN Shanda Howells, MD      . amLODipine (NORVASC) tablet 10 mg  10 mg Oral Daily Shanda Howells, MD      . aspirin EC tablet 325 mg  325 mg Oral Daily Shanda Howells, MD      . calcitRIOL (ROCALTROL) capsule 1 mcg  1 mcg Oral Q M,W,F  Shanda Howells, MD      . cloNIDine (CATAPRES) tablet 0.1 mg  0.1 mg Oral TID Shanda Howells, MD      . heparin injection 5,000 Units  5,000 Units Subcutaneous 3 times per day Shanda Howells, MD      . hydrALAZINE (APRESOLINE) tablet 100 mg  100 mg Oral TID Shanda Howells, MD      . insulin aspart (novoLOG) injection 0-9 Units  0-9 Units Subcutaneous 6 times per day Shanda Howells, MD      . labetalol (NORMODYNE) tablet 400 mg  400 mg Oral TID Shanda Howells, MD      . minoxidil (LONITEN) tablet 2.5 mg  2.5 mg Oral Daily Shanda Howells, MD      . nitroGLYCERIN (NITROSTAT) SL tablet 0.4 mg  0.4 mg Sublingual Q5 min PRN Kalman Drape, MD   0.4 mg at 09/21/14 0115  . pantoprazole (PROTONIX) EC tablet 80 mg  80 mg Oral Daily Shanda Howells, MD      . pravastatin (PRAVACHOL) tablet 40 mg  40 mg Oral Daily Shanda Howells, MD      . primidone (MYSOLINE) tablet 50 mg  50 mg Oral TID Shanda Howells, MD      . sodium chloride 0.9 % injection 3 mL  3 mL Intravenous Q12H Shanda Howells, MD      . sodium chloride 0.9 % injection 3 mL  3 mL Intravenous Q12H Shanda Howells, MD      . sodium chloride 0.9 % injection 3 mL  3 mL Intravenous PRN Shanda Howells, MD      . spironolactone (ALDACTONE) tablet 25 mg  25 mg Oral Daily Shanda Howells, MD      . tamsulosin [facility]) capsule 0.4 mg  0.4 mg Oral Daily Shanda Howells, MD      . traMADol Veatrice Bourbon) tablet 50 mg  50 mg Oral Q6H PRN Shanda Howells, MD       Current Outpatient Prescriptions  Medication Sig Dispense Refill  . amLODipine (NORVASC) 10 MG tablet Take 10 mg by mouth daily.    Marland Kitchen aspirin EC 81 MG tablet Take 81 mg by mouth daily.    . calcitRIOL (ROCALTROL) 0.5 MCG capsule Take 1 mcg by mouth every Monday, Wednesday, and Friday.    . clobetasol ointment (TEMOVATE) [id number] % Apply 1 application topically daily.    . cloNIDine (CATAPRES) 0.1 MG tablet Take 0.1 mg by mouth 3 (three) times daily.    . furosemide (LASIX) 40 MG tablet Take 40-80 mg by mouth 2 (two) times daily.  Take 2 tablets every morning and take 1 tablet in the afternoon    . glipiZIDE (GLUCOTROL) 10 MG tablet Take 10 mg by mouth 2 (two) times daily before a meal.    .  hydrALAZINE (APRESOLINE) 100 MG tablet Take 100 mg by mouth 3 (three) times daily.    Marland Kitchen labetalol (NORMODYNE) 200 MG tablet Take 400 mg by mouth 3 (three) times daily.    Marland Kitchen latanoprost (XALATAN) 0.005 % ophthalmic solution Place 1 drop into the left eye at bedtime.    Marland Kitchen loratadine (CLARITIN) 10 MG tablet Take 10 mg by mouth daily.    . minoxidil (LONITEN) 2.5 MG tablet Take 2.5 mg by mouth daily.    Marland Kitchen omeprazole (PRILOSEC) 40 MG capsule Take 40 mg by mouth daily.    Vladimir Faster Glycol-Propyl Glycol 0.4-0.3 % SOLN Place 1 drop into the right eye at bedtime.    . pravastatin (PRAVACHOL) 40 MG tablet Take 40 mg by mouth daily.    . primidone (MYSOLINE) 50 MG tablet Take 50 mg by mouth 3 (three) times daily.    Marland Kitchen spironolactone (ALDACTONE) 25 MG tablet Take 25 mg by mouth daily.    . tamsulosin (FLOMAX) 0.4 MG CAPS capsule Take 0.4 mg by mouth daily.    . traMADol (ULTRAM) 50 MG tablet Take 50 mg by mouth every 6 (six) hours as needed for moderate pain.     Review Of Systems: 12 point ROS negative except as noted above in HPI.  Physical Exam: Filed Vitals:   09/21/14 0100  BP: 159/61  Pulse: 60  Temp:   Resp: 14    General: alert, cooperative and moderate distress HEENT: PERRLA and extra ocular movement intact Heart: S1, S2 normal, no murmur, rub or gallop, regular rate and rhythm Lungs: clear to auscultation, no wheezes or rales and unlabored breathing Abdomen: abdomen is soft without significant tenderness, masses, organomegaly or guarding Extremities: 2-3+ pitting edema in LEs bilaterally Skin:no rashes Neurology: normal without focal findings  Labs and Imaging: Lab Results  Component Value Date/Time   NA 134* 09/20/2014 10:24 PM   K 5.1 09/20/2014 10:24 PM   CL 106 09/20/2014 10:24 PM   CO2 20 09/20/2014 10:24 PM    BUN 74* 09/20/2014 10:24 PM   CREATININE 3.60* 09/20/2014 10:24 PM   GLUCOSE 216* 09/20/2014 10:24 PM   Lab Results  Component Value Date   WBC 2.8* 09/20/2014   HGB 9.7* 09/20/2014   HCT 30.0* 09/20/2014   MCV 92.0 09/20/2014   PLT 138* 09/20/2014    Dg Chest 2 View  09/20/2014   CLINICAL DATA:  Acute onset of chest pain, shortness of breath and cough. Initial encounter.  EXAM: CHEST  2 VIEW  COMPARISON:  Chest radiograph from 06/14/2008  FINDINGS: The lungs are well-aerated. Vascular congestion is noted, with increased interstitial markings, which likely reflects mild interstitial edema. No pleural effusion or pneumothorax is seen.  The heart is borderline normal in size. No acute osseous abnormalities are seen.  IMPRESSION: Vascular congestion, with increased interstitial markings, likely reflecting mild interstitial edema.   Electronically Signed   By: Garald Balding M.D.   On: 09/20/2014 22:39           Shanda Howells MD  Pager: [phone]

## 2014-09-21 NOTE — ED Notes (Signed)
Pt states he is chest pain free.  

## 2014-09-21 NOTE — Progress Notes (Signed)
UR completed 

## 2014-09-22 DIAGNOSIS — R072 Precordial pain: Secondary | ICD-10-CM

## 2014-09-22 DIAGNOSIS — I5031 Acute diastolic (congestive) heart failure: Principal | ICD-10-CM

## 2014-09-22 LAB — COMPREHENSIVE METABOLIC PANEL
ALK PHOS: 44 U/L (ref 39–117)
ALT: 15 U/L (ref 0–53)
AST: 12 U/L (ref 0–37)
Albumin: 3.4 g/dL — ABNORMAL LOW (ref 3.5–5.2)
Anion gap: 5 (ref 5–15)
BILIRUBIN TOTAL: 0.7 mg/dL (ref 0.3–1.2)
BUN: 67 mg/dL — ABNORMAL HIGH (ref 6–23)
CO2: 24 mmol/L (ref 19–32)
CREATININE: 3.63 mg/dL — AB (ref 0.50–1.35)
Calcium: 8.5 mg/dL (ref 8.4–10.5)
Chloride: 110 mmol/L (ref 96–112)
GFR calc Af Amer: 16 mL/min — ABNORMAL LOW (ref >90)
GFR, EST NON AFRICAN AMERICAN: 14 mL/min — AB (ref >90)
Glucose, Bld: 126 mg/dL — ABNORMAL HIGH (ref 70–99)
Potassium: 4.6 mmol/L (ref 3.5–5.1)
Sodium: 139 mmol/L (ref 135–145)
Total Protein: 6 g/dL (ref 6.0–8.3)

## 2014-09-22 LAB — GLUCOSE, CAPILLARY
GLUCOSE-CAPILLARY: 132 mg/dL — AB (ref 70–99)
GLUCOSE-CAPILLARY: 160 mg/dL — AB (ref 70–99)
Glucose-Capillary: 147 mg/dL — ABNORMAL HIGH (ref 70–99)
Glucose-Capillary: 152 mg/dL — ABNORMAL HIGH (ref 70–99)

## 2014-09-22 LAB — CBC WITH DIFFERENTIAL/PLATELET
BASOS PCT: 1 % (ref 0–1)
Basophils Absolute: 0 10*3/uL (ref 0.0–0.1)
Eosinophils Absolute: 0.2 10*3/uL (ref 0.0–0.7)
Eosinophils Relative: 6 % — ABNORMAL HIGH (ref 0–5)
HCT: 27.2 % — ABNORMAL LOW (ref 39.0–52.0)
Hemoglobin: 8.7 g/dL — ABNORMAL LOW (ref 13.0–17.0)
Lymphocytes Relative: 32 % (ref 12–46)
Lymphs Abs: 1.2 10*3/uL (ref 0.7–4.0)
MCH: 29.5 pg (ref 26.0–34.0)
MCHC: 32 g/dL (ref 30.0–36.0)
MCV: 92.2 fL (ref 78.0–100.0)
Monocytes Absolute: 0.5 10*3/uL (ref 0.1–1.0)
Monocytes Relative: 13 % — ABNORMAL HIGH (ref 3–12)
Neutro Abs: 1.8 10*3/uL (ref 1.7–7.7)
Neutrophils Relative %: 48 % (ref 43–77)
Platelets: 145 10*3/uL — ABNORMAL LOW (ref 150–400)
RBC: 2.95 MIL/uL — ABNORMAL LOW (ref 4.22–5.81)
RDW: 12.7 % (ref 11.5–15.5)
WBC: 3.7 10*3/uL — AB (ref 4.0–10.5)

## 2014-09-22 LAB — HEMOGLOBIN A1C
HEMOGLOBIN A1C: 6 % — AB (ref 4.8–5.6)
Mean Plasma Glucose: 126 mg/dL

## 2014-09-22 MED ORDER — ISOSORBIDE MONONITRATE ER 30 MG PO TB24
30.0000 mg | ORAL_TABLET | Freq: Every day | ORAL | Status: DC
  Administered 2014-09-22 – 2014-09-24 (×3): 30 mg via ORAL
  Filled 2014-09-22 (×3): qty 1

## 2014-09-22 MED ORDER — FUROSEMIDE 80 MG PO TABS
80.0000 mg | ORAL_TABLET | Freq: Two times a day (BID) | ORAL | Status: DC
  Administered 2014-09-23: 80 mg via ORAL
  Filled 2014-09-22 (×3): qty 1

## 2014-09-22 NOTE — Consult Note (Signed)
Referring Physician: [facility] Cardiologist: none Reason for Consultation: CP and dyspnea   HPI:  81 y/o male with severe HTN, CKD stage IV, DM2 admitted with CP and dyspnea.   Previously followed by Dr. Ron Parker in 2010 for HTN and edema. Denies known CAD. Remote cath in [place] years ago. Now receives care at [facility]. Apparent baseline Cr has been > 3. Has refused consideration of HD. 2 weeks ago nephrologist added minoxidil for HTN. Since that time has been more SOB with orthopnea and wheezing. Has also noted several episodes of substernal chest pressure during these episodes.  Came to ER. Trop negative x 3. ECG with lateral TW abnormality suggestive of repol. No change from 2010. Now feeling better with diuresis - down 6 pounds. Echo E 65-70%  No RWMA.     Review of Systems:     Cardiac Review of Systems: {Y] = yes [ ]  = no  Chest Pain [ y   ]  Resting SOB [name] [name]   ] Exertional SOB  [name] [name]  ]  [name] [ y ]   Pedal Edema [ y  ]    Palpitations [ y] Syncope  [  ]   Presyncope [   ]  General Review of Systems: [Y] = yes [  ]=no Constitional: recent weight change [  ]; anorexia [  ]; fatigue [ y ]; nausea [  ]; night sweats [  ]; fever [  ]; or chills [  ];                                                                     Eyes : blurred vision [  ]; diplopia [   ]; vision changes [  ];  Amaurosis fugax[  ]; Resp: cough [ y ];  wheezing[  ];  hemoptysis[  ];  PND [ y ];  GI:  gallstones[  ], vomiting[  ];  dysphagia[  ]; melena[  ];  hematochezia [  ]; heartburn[  ];   GU: kidney stones [  ]; hematuria[  ];   dysuria [  ];  nocturia[  ]; incontinence [  ];             Skin: rash, swelling[  ];, hair loss[  ];  peripheral edema[  ];  or itching[  ]; Musculosketetal: myalgias[  ];  joint swelling[  ];  joint erythema[  ];  joint pain[ y ];  back pain[  ];  Heme/Lymph: bruising[  ];  bleeding[  ];  anemia[  ];  Neuro: TIA[  ];  headaches[  ];  stroke[  ];  vertigo[  ];  seizures[  ];    paresthesias[  ];  difficulty walking[  ];  Psych:depression[  ]; anxiety[  ];  Endocrine: diabetes[y  ];  thyroid dysfunction[  ];  Other:  Past Medical History  Diagnosis Date  . Hypertension   . Diabetes mellitus without complication   . Hyperlipidemia   . Stroke     2009    Medications Prior to Admission  Medication Sig Dispense Refill  . amLODipine (NORVASC) 10 MG tablet Take 10 mg by mouth daily.    Marland Kitchen aspirin EC 81 MG tablet Take 81 mg by  mouth daily.    . calcitRIOL (ROCALTROL) 0.5 MCG capsule Take 1 mcg by mouth every Monday, Wednesday, and Friday.    . clobetasol ointment (TEMOVATE) [id number] % Apply 1 application topically daily.    . cloNIDine (CATAPRES) 0.1 MG tablet Take 0.1 mg by mouth 3 (three) times daily.    . furosemide (LASIX) 40 MG tablet Take 40-80 mg by mouth 2 (two) times daily. Take 2 tablets every morning and take 1 tablet in the afternoon    . glipiZIDE (GLUCOTROL) 10 MG tablet Take 10 mg by mouth 2 (two) times daily before a meal.    . hydrALAZINE (APRESOLINE) 100 MG tablet Take 100 mg by mouth 3 (three) times daily.    Marland Kitchen labetalol (NORMODYNE) 200 MG tablet Take 400 mg by mouth 3 (three) times daily.    Marland Kitchen latanoprost (XALATAN) 0.005 % ophthalmic solution Place 1 drop into the left eye at bedtime.    Marland Kitchen loratadine (CLARITIN) 10 MG tablet Take 10 mg by mouth daily.    . minoxidil (LONITEN) 2.5 MG tablet Take 2.5 mg by mouth daily.    Marland Kitchen omeprazole (PRILOSEC) 40 MG capsule Take 40 mg by mouth daily.    Vladimir Faster Glycol-Propyl Glycol 0.4-0.3 % SOLN Place 1 drop into the right eye at bedtime.    . pravastatin (PRAVACHOL) 40 MG tablet Take 40 mg by mouth daily.    . primidone (MYSOLINE) 50 MG tablet Take 50 mg by mouth 3 (three) times daily.    Marland Kitchen spironolactone (ALDACTONE) 25 MG tablet Take 25 mg by mouth daily.    . tamsulosin (FLOMAX) 0.4 MG CAPS capsule Take 0.4 mg by mouth daily.    . traMADol (ULTRAM) 50 MG tablet Take 50 mg by mouth every 6 (six) hours as  needed for moderate pain.       Marland Kitchen amLODipine  10 mg Oral Daily  . aspirin EC  325 mg Oral Daily  . calcitRIOL  1 mcg Oral Q M,W,F  . cloNIDine  0.1 mg Oral TID  . [START ON 09/23/2014] furosemide  80 mg Oral BID  . heparin  5,000 Units Subcutaneous 3 times per day  . hydrALAZINE  100 mg Oral TID  . insulin aspart  0-9 Units Subcutaneous TID WC  . isosorbide mononitrate  30 mg Oral Daily  . labetalol  400 mg Oral TID  . minoxidil  2.5 mg Oral Daily  . pantoprazole  80 mg Oral Daily  . pravastatin  40 mg Oral Daily  . primidone  50 mg Oral TID  . sodium chloride  3 mL Intravenous Q12H  . sodium chloride  3 mL Intravenous Q12H  . spironolactone  25 mg Oral Daily  . tamsulosin  0.4 mg Oral Daily    Infusions:    Allergies  Allergen Reactions  . Fosinopril Sodium Other (See Comments)    Unknown   . Meloxicam     REACTION: stomah pains, nausea, constipation  . Valsartan Other (See Comments)    unknown    History   Social History  . Marital Status: Married    Spouse Name: N/A    Number of Children: N/A  . Years of Education: N/A   Occupational History  . Not on file.   Social History Main Topics  . Smoking status: Never Smoker   . Smokeless tobacco: Never Used  . Alcohol Use: No  . Drug Use: No  . Sexual Activity: Not on file   Other Topics Concern  .  Not on file   Social History Narrative    History reviewed. No pertinent family history.  PHYSICAL EXAM: Filed Vitals:   09/22/14 1355  BP: 145/54  Pulse: 72  Temp: 98.5 F (36.9 C)  Resp: 18     Intake/Output Summary (Last 24 hours) at 09/22/14 1712 Last data filed at 09/22/14 1600  Gross per 24 hour  Intake    480 ml  Output   1300 ml  Net   -820 ml    General:  Elderly. Lying flat in bed No respiratory difficulty HEENT: normal Neck: supple. JVP to jaw.  Carotids 2+ bilat; no bruits. No lymphadenopathy or thryomegaly appreciated. Cor: PMI nondisplaced. Iregular rate & rhythm. No rubs,  gallops or murmurs. Lungs: clear Abdomen: soft, nontender, mildly distended. No hepatosplenomegaly. No bruits or masses. Good bowel sounds. Extremities: no cyanosis, clubbing, rash, 1-2+ edema Neuro: alert & oriented x 3, cranial nerves grossly intact. moves all 4 extremities w/o difficulty. Affect pleasant.  ECG: SR 77 with LVH frequent PVCs lateral TWI suggestive of strain. No change from 2010  Results for orders placed or performed during the hospital encounter of 09/20/14 (from the past 24 hour(s))  Glucose, capillary     Status: Abnormal   Collection Time: 09/21/14  8:36 PM  Result Value Ref Range   Glucose-Capillary 130 (H) 70 - 99 mg/dL   Comment 1 Documented in Chart    Comment 2 Notify RN   Comprehensive metabolic panel     Status: Abnormal   Collection Time: 09/22/14  4:50 AM  Result Value Ref Range   Sodium 139 135 - 145 mmol/L   Potassium 4.6 3.5 - 5.1 mmol/L   Chloride 110 96 - 112 mmol/L   CO2 24 19 - 32 mmol/L   Glucose, Bld 126 (H) 70 - 99 mg/dL   BUN 67 (H) 6 - 23 mg/dL   Creatinine, Ser 3.63 (H) 0.50 - 1.35 mg/dL   Calcium 8.5 8.4 - 10.5 mg/dL   Total Protein 6.0 6.0 - 8.3 g/dL   Albumin 3.4 (L) 3.5 - 5.2 g/dL   AST 12 0 - 37 U/L   ALT 15 0 - 53 U/L   Alkaline Phosphatase 44 39 - 117 U/L   Total Bilirubin 0.7 0.3 - 1.2 mg/dL   GFR calc non Af Amer 14 (L) >90 mL/min   GFR calc Af Amer 16 (L) >90 mL/min   Anion gap 5 5 - 15  CBC WITH DIFFERENTIAL     Status: Abnormal   Collection Time: 09/22/14  4:50 AM  Result Value Ref Range   WBC 3.7 (L) 4.0 - 10.5 K/uL   RBC 2.95 (L) 4.22 - 5.81 MIL/uL   Hemoglobin 8.7 (L) 13.0 - 17.0 g/dL   HCT 27.2 (L) 39.0 - 52.0 %   MCV 92.2 78.0 - 100.0 fL   MCH 29.5 26.0 - 34.0 pg   MCHC 32.0 30.0 - 36.0 g/dL   RDW 12.7 11.5 - 15.5 %   Platelets 145 (L) 150 - 400 K/uL   Neutrophils Relative % 48 43 - 77 %   Neutro Abs 1.8 1.7 - 7.7 K/uL   Lymphocytes Relative 32 12 - 46 %   Lymphs Abs 1.2 0.7 - 4.0 K/uL   Monocytes Relative  13 (H) 3 - 12 %   Monocytes Absolute 0.5 0.1 - 1.0 K/uL   Eosinophils Relative 6 (H) 0 - 5 %   Eosinophils Absolute 0.2 0.0 - 0.7 K/uL  Basophils Relative 1 0 - 1 %   Basophils Absolute 0.0 0.0 - 0.1 K/uL  Glucose, capillary     Status: Abnormal   Collection Time: 09/22/14  6:29 AM  Result Value Ref Range   Glucose-Capillary 132 (H) 70 - 99 mg/dL   Comment 1 Notify RN   Glucose, capillary     Status: Abnormal   Collection Time: 09/22/14 11:11 AM  Result Value Ref Range   Glucose-Capillary 147 (H) 70 - 99 mg/dL   Comment 1 Notify RN   Glucose, capillary     Status: Abnormal   Collection Time: 09/22/14  4:07 PM  Result Value Ref Range   Glucose-Capillary 160 (H) 70 - 99 mg/dL   Comment 1 Notify RN    Dg Chest 2 View  09/20/2014   CLINICAL DATA:  Acute onset of chest pain, shortness of breath and cough. Initial encounter.  EXAM: CHEST  2 VIEW  COMPARISON:  Chest radiograph from 06/14/2008  FINDINGS: The lungs are well-aerated. Vascular congestion is noted, with increased interstitial markings, which likely reflects mild interstitial edema. No pleural effusion or pneumothorax is seen.  The heart is borderline normal in size. No acute osseous abnormalities are seen.  IMPRESSION: Vascular congestion, with increased interstitial markings, likely reflecting mild interstitial edema.   Electronically Signed   By: Garald Balding M.D.   On: 09/20/2014 22:39     ASSESSMENT: 1. A/c diastolic HF, EF [id number] 2. Chest pain 3. Chronic renal failure, stage IV    --refusing dialysis 4. HTN, uncontrolled 5. DM2  PLAN/DISCUSSION:  Suspect HF flare due to fluid retention from addition of minoxidil +/- worsening renal function. Would stop minoxidil and diurese as you are doing. Watch renal function closely. Can titrate clonidine or add doxazosin for HTN as needed.  CP has typical and atypical features. ECG with chronic repol changes laterally.  Troponin negative x 3 despite advanced CKD. Suspect CP may  be related to volume overload. With severe CKD and refusal of HD not candidate for cath. However family is interested in stress testing for further risk stratification. Will arrange. Can stop heparin. Continue ASA and statin.   Benay Spice 5:17 PM

## 2014-09-22 NOTE — Care Management Note (Unsigned)
    Page 1 of 1   09/22/2014     4:22:28 PM CARE MANAGEMENT NOTE 09/22/2014  Patient:  [name] [name], [name] [name]   Account Number:  [id number]  Date Initiated:  09/22/2014  Documentation initiated by:  Sidnie Swalley  Subjective/Objective Assessment:   Pt adm on acute CHF exacerbation on 09/20/14.  PTA, pt resides at home with spouse.     Action/Plan:   Will follow for dc needs as pt progresses.  PT eval pending.   Anticipated DC Date:  09/24/2014   Anticipated DC Plan:  [place]  CM consult      Choice offered to / List presented to:             Status of service:  In process, will continue to follow Medicare Important Message given?   (If response is "NO", the following Medicare IM given date fields will be blank) Date Medicare IM given:   Medicare IM given by:   Date Additional Medicare IM given:   Additional Medicare IM given by:    Discharge Disposition:    Per UR Regulation:  Reviewed for med. necessity/level of care/duration of stay  If discussed at [place] of Stay Meetings, dates discussed:    Comments:

## 2014-09-22 NOTE — Progress Notes (Signed)
PROGRESS NOTE  [name] [name] [id number] DOB: Jun 07, 1929 DOA: 09/20/2014 PCP: Pcp Not In System  Brief history 81 year old male with a history of CKD stage V, hypertension, diabetes mellitus, and hyperlipidemia presented with one-week history of shortness of breath and worsening lower extremity edema. The patient had complained of worsening dyspnea on exertion for 2 days prior to admission. He also complained of worsening orthopnea. He also began complaining of chest discomfort one day prior to admission. Upon admission, chest x-ray revealed increasing vascular congestion and interstitial markings. BNP was 560. The patient was started on intravenous furosemide. Troponins were cycled for his chest pain. Assessment/Plan: Acute diastolic CHF -Restart intravenous furosemide 80 mg twice a day-->switch back to po lasix in am 09/23/14 -Daily weights-->neg 2.5L -Fluid restrict -Echocardiogram--EF 65-70 percent, grade 1 diastolic dysfunction, no WMA Atypical chest pain -Troponins negative 3  -Patient continues to have some chest discomfort with dizziness on exertion during the hospitalization -Initial EKG showed T-wave inversion in the lateral leads -although his renal failure precludes heart cath, consult cardiology -may just need to optimize medical therapy Malignant hypertension -Continue amlodipine, clonidine, hydralazine, labetalol, minoxidil, Aldactone, -Discontinue nitroglycerin patch -Start Imdur 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 -Worsening creatinine when compared to 2011 likely represents progression of the patient's renal disease  -Monitor renal function on furosemide  -baseline creatinine 3.1-3.5 from [facility] Diabetes mellitus type 2  -Hemoglobin A1c--6.0 -NovoLog sliding scale  Thrombocytopenia  -Chronic  -No signs of active bleeding   Family communication:  Updated daughter at bedside DIspo: pending cards eval  Procedures/Studies: Dg Chest  2 View  09/20/2014   CLINICAL DATA:  Acute onset of chest pain, shortness of breath and cough. Initial encounter.  EXAM: CHEST  2 VIEW  COMPARISON:  Chest radiograph from 06/14/2008  FINDINGS: The lungs are well-aerated. Vascular congestion is noted, with increased interstitial markings, which likely reflects mild interstitial edema. No pleural effusion or pneumothorax is seen.  The heart is borderline normal in size. No acute osseous abnormalities are seen.  IMPRESSION: Vascular congestion, with increased interstitial markings, likely reflecting mild interstitial edema.   Electronically Signed   By: Garald Balding M.D.   On: 09/20/2014 22:39         Subjective: Patient still having intermittent chest discomfort and dizziness with exertion. He is still having nonproductive cough. Denies any hemoptysis, nausea, vomiting, diarrhea, vomiting, dysuria, hematuria.  Objective: Filed Vitals:   09/22/14 0226 09/22/14 [id number] 09/22/14 0815 09/22/14 1355  BP: 150/62 170/66 144/79 145/54  Pulse: 66 73 66 72  Temp: 98.1 F (36.7 C) 98.7 F (37.1 C) 98.3 F (36.8 C) 98.5 F (36.9 C)  TempSrc: Oral Oral Oral Oral  Resp: 19 18 18 18   Height:      Weight:  118.4 kg (261 lb 0.4 oz)    SpO2: 97% 98% 98% 98%    Intake/Output Summary (Last 24 hours) at 09/22/14 1552 Last data filed at 09/22/14 1205  Gross per 24 hour  Intake    480 ml  Output   1150 ml  Net   -670 ml   Weight change: -2.8 kg (-6 lb 2.8 oz) Exam:   General:  Pt is alert, follows commands appropriately, not in acute distress  HEENT: No icterus, No thrush,[place]/AT  Cardiovascular: RRR, S1/S2, no rubs, no gallops  Respiratory: CTA bilaterally, no wheezing, no crackles, no rhonchi  Abdomen: Soft/+BS, non tender, non distended, no guarding  Extremities: 1+LE edema,  No lymphangitis, No petechiae, No rashes, no synovitis  Data Reviewed: Basic Metabolic Panel:  Recent Labs Lab 09/20/14 2224 09/21/14 0219 09/22/14 0450  NA  134* 138 139  K 5.1 5.0 4.6  CL 106 108 110  CO2 20 22 24   GLUCOSE 216* 123* 126*  BUN 74* 73* 67*  CREATININE 3.60* 3.54* 3.63*  CALCIUM 8.6 8.6 8.5   Liver Function Tests:  Recent Labs Lab 09/21/14 0219 09/22/14 0450  AST 14 12  ALT 19 15  ALKPHOS 51 44  BILITOT 0.4 0.7  PROT 6.8 6.0  ALBUMIN 3.8 3.4*   No results for input(s): LIPASE, AMYLASE in the last 168 hours. No results for input(s): AMMONIA in the last 168 hours. CBC:  Recent Labs Lab 09/20/14 2224 09/21/14 0219 09/22/14 0450  WBC 2.8* 2.9* 3.7*  NEUTROABS  --  1.4* 1.8  HGB 9.7* 9.2* 8.7*  HCT 30.0* 28.7* 27.2*  MCV 92.0 91.7 92.2  PLT 138* 142* 145*   Cardiac Enzymes:  Recent Labs Lab 09/21/14 0219 09/21/14 0803 09/21/14 1333  TROPONINI <0.03 <0.03 <0.03   BNP: Invalid input(s): POCBNP CBG:  Recent Labs Lab 09/21/14 1142 09/21/14 1604 09/21/14 2036 09/22/14 0629 09/22/14 1111  GLUCAP 120* 229* 130* 132* 147*    No results found for this or any previous visit (from the past 240 hour(s)).   Scheduled Meds: . amLODipine  10 mg Oral Daily  . aspirin EC  325 mg Oral Daily  . calcitRIOL  1 mcg Oral Q M,W,F  . cloNIDine  0.1 mg Oral TID  . [START ON 09/23/2014] furosemide  80 mg Oral BID  . heparin  5,000 Units Subcutaneous 3 times per day  . hydrALAZINE  100 mg Oral TID  . insulin aspart  0-9 Units Subcutaneous TID WC  . isosorbide mononitrate  30 mg Oral Daily  . labetalol  400 mg Oral TID  . minoxidil  2.5 mg Oral Daily  . pantoprazole  80 mg Oral Daily  . pravastatin  40 mg Oral Daily  . primidone  50 mg Oral TID  . sodium chloride  3 mL Intravenous Q12H  . sodium chloride  3 mL Intravenous Q12H  . spironolactone  25 mg Oral Daily  . tamsulosin  0.4 mg Oral Daily   Continuous Infusions:    Zoraya Fiorenza, DO  [facility] Pager [phone]  If 7PM-7AM, please contact night-coverage [email or web address] Password [email or web address] 09/22/2014, 3:52 PM   LOS: 2 days

## 2014-09-22 NOTE — Clinical Documentation Improvement (Signed)
Presents with Acute Diastolic CHF, has CKD 4-5.   Hypertension is an assumed link with CKD  Hypertension is not an assumed link with Heart Disease  ECHO reveals moderate LVH, EF 65-70%, with grade 1 diastolic dysfunction  Patient weighs 267 pounds with bmi of 35  Please clarify if you feel the patient has:  Hypertensive Heart and CKD5 with CHF  Hypertensive CKD with CHF  Cardiorenal Syndrome  Other Condition  Please document findings in next progress note and include in discharge summary if applicable.  Thank You, Zoila Shutter ,RN Clinical Documentation Specialist:  [place] [employer]

## 2014-09-23 ENCOUNTER — Inpatient Hospital Stay (HOSPITAL_COMMUNITY): Payer: Medicare Other

## 2014-09-23 DIAGNOSIS — E8770 Fluid overload, unspecified: Secondary | ICD-10-CM

## 2014-09-23 DIAGNOSIS — N184 Chronic kidney disease, stage 4 (severe): Secondary | ICD-10-CM

## 2014-09-23 DIAGNOSIS — R0789 Other chest pain: Secondary | ICD-10-CM

## 2014-09-23 DIAGNOSIS — R079 Chest pain, unspecified: Secondary | ICD-10-CM

## 2014-09-23 LAB — CBC WITH DIFFERENTIAL/PLATELET
BASOS PCT: 1 % (ref 0–1)
Basophils Absolute: 0 10*3/uL (ref 0.0–0.1)
EOS PCT: 6 % — AB (ref 0–5)
Eosinophils Absolute: 0.2 10*3/uL (ref 0.0–0.7)
HCT: 29 % — ABNORMAL LOW (ref 39.0–52.0)
HEMOGLOBIN: 9.4 g/dL — AB (ref 13.0–17.0)
Lymphocytes Relative: 33 % (ref 12–46)
Lymphs Abs: 1.2 10*3/uL (ref 0.7–4.0)
MCH: 30.5 pg (ref 26.0–34.0)
MCHC: 32.4 g/dL (ref 30.0–36.0)
MCV: 94.2 fL (ref 78.0–100.0)
Monocytes Absolute: 0.4 10*3/uL (ref 0.1–1.0)
Monocytes Relative: 12 % (ref 3–12)
NEUTROS ABS: 1.8 10*3/uL (ref 1.7–7.7)
Neutrophils Relative %: 48 % (ref 43–77)
PLATELETS: 145 10*3/uL — AB (ref 150–400)
RBC: 3.08 MIL/uL — AB (ref 4.22–5.81)
RDW: 12.8 % (ref 11.5–15.5)
WBC: 3.6 10*3/uL — AB (ref 4.0–10.5)

## 2014-09-23 LAB — COMPREHENSIVE METABOLIC PANEL
ALT: 16 U/L (ref 0–53)
AST: 13 U/L (ref 0–37)
Albumin: 3.5 g/dL (ref 3.5–5.2)
Alkaline Phosphatase: 47 U/L (ref 39–117)
Anion gap: 10 (ref 5–15)
BUN: 70 mg/dL — AB (ref 6–23)
CO2: 23 mmol/L (ref 19–32)
Calcium: 8.7 mg/dL (ref 8.4–10.5)
Chloride: 107 mmol/L (ref 96–112)
Creatinine, Ser: 3.85 mg/dL — ABNORMAL HIGH (ref 0.50–1.35)
GFR calc Af Amer: 15 mL/min — ABNORMAL LOW (ref >90)
GFR, EST NON AFRICAN AMERICAN: 13 mL/min — AB (ref >90)
GLUCOSE: 138 mg/dL — AB (ref 70–99)
Potassium: 4.4 mmol/L (ref 3.5–5.1)
Sodium: 140 mmol/L (ref 135–145)
Total Bilirubin: 0.6 mg/dL (ref 0.3–1.2)
Total Protein: 6.5 g/dL (ref 6.0–8.3)

## 2014-09-23 LAB — GLUCOSE, CAPILLARY
GLUCOSE-CAPILLARY: 147 mg/dL — AB (ref 70–99)
GLUCOSE-CAPILLARY: 140 mg/dL — AB (ref 70–99)
Glucose-Capillary: 126 mg/dL — ABNORMAL HIGH (ref 70–99)
Glucose-Capillary: 139 mg/dL — ABNORMAL HIGH (ref 70–99)

## 2014-09-23 MED ORDER — TECHNETIUM TC 99M SESTAMIBI GENERIC - CARDIOLITE
10.0000 | Freq: Once | INTRAVENOUS | Status: AC | PRN
  Administered 2014-09-23: 10 via INTRAVENOUS

## 2014-09-23 MED ORDER — CLONIDINE HCL 0.2 MG PO TABS
0.2000 mg | ORAL_TABLET | Freq: Three times a day (TID) | ORAL | Status: DC
  Administered 2014-09-23 – 2014-09-24 (×3): 0.2 mg via ORAL
  Filled 2014-09-23 (×5): qty 1

## 2014-09-23 MED ORDER — REGADENOSON 0.4 MG/5ML IV SOLN
INTRAVENOUS | Status: AC
  Administered 2014-09-23: 0.4 mg via INTRAVENOUS
  Filled 2014-09-23: qty 5

## 2014-09-23 MED ORDER — TECHNETIUM TC 99M SESTAMIBI GENERIC - CARDIOLITE
30.0000 | Freq: Once | INTRAVENOUS | Status: AC | PRN
  Administered 2014-09-23: 30 via INTRAVENOUS

## 2014-09-23 MED ORDER — REGADENOSON 0.4 MG/5ML IV SOLN
0.4000 mg | Freq: Once | INTRAVENOUS | Status: AC
  Administered 2014-09-23: 0.4 mg via INTRAVENOUS
  Filled 2014-09-23: qty 5

## 2014-09-23 MED ORDER — FUROSEMIDE 80 MG PO TABS
80.0000 mg | ORAL_TABLET | Freq: Two times a day (BID) | ORAL | Status: DC
  Administered 2014-09-24: 80 mg via ORAL
  Filled 2014-09-23 (×3): qty 1

## 2014-09-23 NOTE — Progress Notes (Signed)
PROGRESS NOTE  [name] [name] [id number] DOB: 1929-04-09 DOA: 09/20/2014 PCP: Pcp Not In System Brief history 81 year old male with a history of CKD stage V, hypertension, diabetes mellitus, and hyperlipidemia presented with one-week history of shortness of breath and worsening lower extremity edema. The patient had complained of worsening dyspnea on exertion for 2 days prior to admission. He also complained of worsening orthopnea. He also began complaining of chest discomfort one day prior to admission. Upon admission, chest x-ray revealed increasing vascular congestion and interstitial markings. BNP was 560. The patient was started on intravenous furosemide. Troponins were cycled for his chest pain. Assessment/Plan: Acute diastolic CHF -Restart intravenous furosemide 80 mg twice a day-->switch back to po lasix in am 09/23/14 -Daily weights-->neg 3L -Fluid restrict -Echocardiogram--EF 65-70 percent, grade 1 diastolic dysfunction, no WMA Atypical chest pain -Troponins negative 3  -Patient continues to have some chest discomfort with dizziness on exertion during the hospitalization -Initial EKG showed T-wave inversion in the lateral leads -appreciate cardiology followup -09/23/14 Lexiscan Stress test--low risk -may just need to optimize medical therapy Malignant hypertension -Continue amlodipine, clonidine, hydralazine, labetalol, - minoxidil discontinued as this may have contributed to the patient's fluid overload - increase clonidine to 0.2 mg 3 times a day -Discontinue nitroglycerin patch -Start Imdur 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 -Worsening creatinine when compared to 2011 likely represents progression of the patient's renal disease  -Monitor renal function on furosemide  -baseline creatinine 3.1-3.5 from [facility] Diabetes mellitus type 2  -Hemoglobin A1c--6.0 -NovoLog sliding scale  Thrombocytopenia  -Chronic  -No signs of active bleeding    Family Communication:   Daughter updated at beside Disposition Plan:   Home 09/24/14 if stable       Procedures/Studies: Dg Chest 2 View  09/20/2014   CLINICAL DATA:  Acute onset of chest pain, shortness of breath and cough. Initial encounter.  EXAM: CHEST  2 VIEW  COMPARISON:  Chest radiograph from 06/14/2008  FINDINGS: The lungs are well-aerated. Vascular congestion is noted, with increased interstitial markings, which likely reflects mild interstitial edema. No pleural effusion or pneumothorax is seen.  The heart is borderline normal in size. No acute osseous abnormalities are seen.  IMPRESSION: Vascular congestion, with increased interstitial markings, likely reflecting mild interstitial edema.   Electronically Signed   By: Garald Balding M.D.   On: 09/20/2014 22:39   Nm Myocar Multi W/spect W/wall Motion / Ef  09/23/2014   CLINICAL DATA:  Chest pain  EXAM: Lexiscan Cardiolite  TECHNIQUE: The patient received IV Lexiscan .4mg  over 15 seconds. 33.0 mCi of Technetium 37m Sestamibi injected at 30 seconds. Quantitative SPECT images were obtained in the vertical, horizontal and short axis planes after a 45 minute delay. Rest images were obtained with similar planes and delay using 10.2 mCi of Technetium 26m Sestamibi.  FINDINGS: ECG: NSR with anterolateral T wave inversion. No change with infusion.  Quantitiative Gated SPECT EF:  50%.  Mild diffuse hypokinesis.  Perfusion Images: There was a small, mild basal to mid inferolateral defect noted both at rest and with Lexiscan stress. There was no significant reversibility.  IMPRESSION: Low risk study. There was a fixed, small mild basal to mid inferolateral perfusion defect noted both at rest and with stress. Cannot rule out small prior MI (versus attenuation). No ischemia. EF 50%, consider echo to reassess EF.  Dalton Mclean   Electronically Signed   By: Loralie Champagne M.D.   On: 09/23/2014  14:49         Subjective: Patient denies fevers, chills,  headache, chest pain, dyspnea, nausea, vomiting, diarrhea, abdominal pain, dysuria, hematuria. He states that he was able to walk to the bathroom today without any shortness of breath or chest discomfort.   Objective: Filed Vitals:   09/23/14 1113 09/23/14 1115 09/23/14 1219 09/23/14 1426  BP: 147/56 160/64 174/80 127/53  Pulse:   58 60  Temp:   97.5 F (36.4 C) 97.7 F (36.5 C)  TempSrc:   Oral Oral  Resp: 20  18 20   Height:      Weight:      SpO2:   100% 98%    Intake/Output Summary (Last 24 hours) at 09/23/14 1925 Last data filed at 09/23/14 1848  Gross per 24 hour  Intake    540 ml  Output   1000 ml  Net   -460 ml   Weight change: -0.964 kg (-2 lb 2 oz) Exam:   General:  Pt is alert, follows commands appropriately, not in acute distress  HEENT: No icterus, No thrush, [place]/AT  Cardiovascular: RRR, S1/S2, no rubs, no gallops  Respiratory: CTA bilaterally, no wheezing, no crackles, no rhonchi  Abdomen: Soft/+BS, non tender, non distended, no guarding  Extremities: 2+LE edema, No lymphangitis, No petechiae, No rashes, no synovitis  Data Reviewed: Basic Metabolic Panel:  Recent Labs Lab 09/20/14 2224 09/21/14 0219 09/22/14 0450 09/23/14 0413  NA 134* 138 139 140  K 5.1 5.0 4.6 4.4  CL 106 108 110 107  CO2 20 22 24 23   GLUCOSE 216* 123* 126* 138*  BUN 74* 73* 67* 70*  CREATININE 3.60* 3.54* 3.63* 3.85*  CALCIUM 8.6 8.6 8.5 8.7   Liver Function Tests:  Recent Labs Lab 09/21/14 0219 09/22/14 0450 09/23/14 0413  AST 14 12 13   ALT 19 15 16   ALKPHOS 51 44 47  BILITOT 0.4 0.7 0.6  PROT 6.8 6.0 6.5  ALBUMIN 3.8 3.4* 3.5   No results for input(s): LIPASE, AMYLASE in the last 168 hours. No results for input(s): AMMONIA in the last 168 hours. CBC:  Recent Labs Lab 09/20/14 2224 09/21/14 0219 09/22/14 0450 09/23/14 0413  WBC 2.8* 2.9* 3.7* 3.6*  NEUTROABS  --  1.4* 1.8 1.8  HGB 9.7* 9.2* 8.7* 9.4*  HCT 30.0* 28.7* 27.2* 29.0*  MCV 92.0 91.7 92.2  94.2  PLT 138* 142* 145* 145*   Cardiac Enzymes:  Recent Labs Lab 09/21/14 0219 09/21/14 0803 09/21/14 1333  TROPONINI <0.03 <0.03 <0.03   BNP: Invalid input(s): POCBNP CBG:  Recent Labs Lab 09/22/14 1607 09/22/14 2056 09/23/14 0627 09/23/14 1227 09/23/14 1640  GLUCAP 160* 152* 126* 147* 140*    No results found for this or any previous visit (from the past 240 hour(s)).   Scheduled Meds: . amLODipine  10 mg Oral Daily  . aspirin EC  325 mg Oral Daily  . calcitRIOL  1 mcg Oral Q M,W,F  . cloNIDine  0.2 mg Oral TID  . [START ON 09/24/2014] furosemide  80 mg Oral BID  . heparin  5,000 Units Subcutaneous 3 times per day  . hydrALAZINE  100 mg Oral TID  . insulin aspart  0-9 Units Subcutaneous TID WC  . isosorbide mononitrate  30 mg Oral Daily  . labetalol  400 mg Oral TID  . pantoprazole  80 mg Oral Daily  . pravastatin  40 mg Oral Daily  . primidone  50 mg Oral TID  . sodium chloride  3 mL Intravenous Q12H  . sodium chloride  3 mL Intravenous Q12H  . tamsulosin  0.4 mg Oral Daily   Continuous Infusions:    Rosemae Mcquown, DO  [facility] Pager [phone]  If 7PM-7AM, please contact night-coverage [email or web address] Password [email or web address] 09/23/2014, 7:25 PM   LOS: 3 days

## 2014-09-23 NOTE — Progress Notes (Signed)
Subjective: Breathing better.  No CP  Objective: Vital signs in last 24 hours: Temp:  [98 F (36.7 C)-98.5 F (36.9 C)] 98.2 F (36.8 C) (02/05 0521) Pulse Rate:  [66-72] 68 (02/05 0521) Resp:  [18-20] 18 (02/05 0521) BP: (131-145)/(48-65) 140/65 mmHg (02/05 0521) SpO2:  [98 %-100 %] 98 % (02/05 0521) Weight:  [258 lb 14.4 oz (117.436 kg)] 258 lb 14.4 oz (117.436 kg) (02/05 0521) Last BM Date: 09/20/14  Intake/Output from previous day: 02/04 0701 - 02/05 0700 In: 904 [P.O.:904] Out: 1450 [Urine:1450] Intake/Output this shift:    Medications Current Facility-Administered Medications  Medication Dose Route Frequency Provider Last Rate Last Dose  . 0.9 %  sodium chloride infusion  250 mL Intravenous PRN Shanda Howells, MD      . amLODipine (NORVASC) tablet 10 mg  10 mg Oral Daily Shanda Howells, MD   10 mg at 09/22/14 1147  . aspirin EC tablet 325 mg  325 mg Oral Daily Shanda Howells, MD   325 mg at 09/22/14 1149  . calcitRIOL (ROCALTROL) capsule 1 mcg  1 mcg Oral Q M,W,F Shanda Howells, MD   1 mcg at 09/21/14 1035  . cloNIDine (CATAPRES) tablet 0.1 mg  0.1 mg Oral TID Shanda Howells, MD   0.1 mg at 09/22/14 2223  . furosemide (LASIX) tablet 80 mg  80 mg Oral BID Orson Eva, MD   80 mg at 09/23/14 0755  . heparin injection 5,000 Units  5,000 Units Subcutaneous 3 times per day Shanda Howells, MD   5,000 Units at 09/23/14 0631  . hydrALAZINE (APRESOLINE) tablet 100 mg  100 mg Oral TID Shanda Howells, MD   100 mg at 09/22/14 2224  . insulin aspart (novoLOG) injection 0-9 Units  0-9 Units Subcutaneous TID WC Orson Eva, MD   2 Units at 09/22/14 1724  . isosorbide mononitrate (IMDUR) 24 hr tablet 30 mg  30 mg Oral Daily Orson Eva, MD   30 mg at 09/22/14 1723  . labetalol (NORMODYNE) tablet 400 mg  400 mg Oral TID Shanda Howells, MD   400 mg at 09/22/14 2223  . pantoprazole (PROTONIX) EC tablet 80 mg  80 mg Oral Daily Shanda Howells, MD   80 mg at 09/22/14 1146  . pravastatin (PRAVACHOL)  tablet 40 mg  40 mg Oral Daily Shanda Howells, MD   40 mg at 09/22/14 1147  . primidone (MYSOLINE) tablet 50 mg  50 mg Oral TID Shanda Howells, MD   50 mg at 09/22/14 2224  . sodium chloride 0.9 % injection 3 mL  3 mL Intravenous Q12H Shanda Howells, MD   3 mL at 09/21/14 2053  . sodium chloride 0.9 % injection 3 mL  3 mL Intravenous Q12H Shanda Howells, MD   3 mL at 09/22/14 2243  . sodium chloride 0.9 % injection 3 mL  3 mL Intravenous PRN Shanda Howells, MD      . spironolactone (ALDACTONE) tablet 25 mg  25 mg Oral Daily Shanda Howells, MD   25 mg at 09/22/14 1146  . tamsulosin (FLOMAX) capsule 0.4 mg  0.4 mg Oral Daily Shanda Howells, MD   0.4 mg at 09/22/14 1146  . traMADol (ULTRAM) tablet 50 mg  50 mg Oral Q6H PRN Shanda Howells, MD   50 mg at 09/23/14 0755    PE: General appearance: alert, cooperative and no distress Lungs: clear to auscultation bilaterally Heart: regular rate and rhythm Extremities: Trace LEE Pulses: 2+ and symmetric Skin: Warm and dry Neurologic:  Grossly normal  Lab Results:   Recent Labs  09/21/14 0219 09/22/14 0450 09/23/14 0413  WBC 2.9* 3.7* 3.6*  HGB 9.2* 8.7* 9.4*  HCT 28.7* 27.2* 29.0*  PLT 142* 145* 145*   BMET  Recent Labs  09/21/14 0219 09/22/14 0450 09/23/14 0413  NA 138 139 140  K 5.0 4.6 4.4  CL 108 110 107  CO2 22 24 23   GLUCOSE 123* 126* 138*  BUN 73* 67* 70*  CREATININE 3.54* 3.63* 3.85*  CALCIUM 8.6 8.5 8.7   PT/INR No results for input(s): LABPROT, INR in the last 72 hours. Cholesterol  Recent Labs  09/21/14 0219  CHOL 132   Lipid Panel     Component Value Date/Time   CHOL 132 09/21/2014 0219   TRIG 42 09/21/2014 0219   HDL 46 09/21/2014 0219   CHOLHDL 2.9 09/21/2014 0219   VLDL 8 09/21/2014 0219   LDLCALC 78 09/21/2014 0219     Assessment/Plan 81 y/o male with severe HTN, CKD stage IV, DM2 admitted with CP and dyspnea.   Active Problems:   Chest pain Ruled out for MI.  He tolerated the Lexiscan very well.    Interpretation to follow.     Acute diastolic CHF (congestive heart failure) Net fluids:  -0.5L/-3.0L.  He was laying flat in nuc med.  Appears close to being euvolemic.  On PO lasix 80mg  BID now.  SCr has increased.  Stop spironolactone and increase clonidine for BP control.  Consider doxazosin.      Chronic renal failure stage IV  Refuses HD.  SCr worsening.  Baseline >3   Weakness   HTN   Mildly elevated earlier and more elevate in nuc med.     LOS: 3 days    HAGER, BRYAN PA-C 09/23/2014 8:22 AM   I have examined the patient and reviewed assessment and plan and discussed with patient.  Agree with above as stated.   SCr has increased.  Stop spironolactone and increase clonidine for BP control.  Consider doxazosin.   Feige Lowdermilk S.

## 2014-09-23 NOTE — Progress Notes (Signed)
Received report from Glenna Fellows and will provide care from 3p-7p.  Denies pain.  Ambulated in hall with PT and tolerated well.

## 2014-09-23 NOTE — Evaluation (Signed)
Physical Therapy Evaluation Patient Details Name: [name] [name] MRN: [id number] DOB: [birth year] Today's Date: 09/23/2014   History of Present Illness  81 y/o male with severe HTN, CKD stage IV, DM2 admitted with CP and dyspnea. Pt with acute on chronic diastolic heart failure.  Clinical Impression  Pt doing well with mobility and no further PT needed.  Ready for dc from PT standpoint.      Follow Up Recommendations No PT follow up    Equipment Recommendations  None recommended by PT    Recommendations for Other Services       Precautions / Restrictions Precautions Precautions: Fall      Mobility  Bed Mobility Overal bed mobility: Modified Independent                Transfers Overall transfer level: Modified independent                  Ambulation/Gait Ambulation/Gait assistance: Supervision Ambulation Distance (Feet): 300 Feet Assistive device: Straight cane Gait Pattern/deviations: Step-through pattern;Decreased stride length     General Gait Details: Pt with dyspnea 2/4 at end of amb which pt reports is much better than at admission.  Stairs            Wheelchair Mobility    Modified Rankin (Stroke Patients Only)       Balance Overall balance assessment: Needs assistance Sitting-balance support: No upper extremity supported;Feet supported Sitting balance-Leahy Scale: Normal     Standing balance support: No upper extremity supported Standing balance-Leahy Scale: Good                               Pertinent Vitals/Pain Pain Assessment: No/denies pain    Home Living Family/patient expects to be discharged to:: Private residence Living Arrangements: Children Available Help at Discharge: Available PRN/intermittently;Family Type of Home: House Home Access: Stairs to enter Entrance Stairs-Rails: Right Entrance Stairs-Number of Steps: 5 Home Layout: One level Home Equipment: Cane - single point      Prior  Function Level of Independence: Independent with assistive device(s)         Comments: amb with cane     Hand Dominance        Extremity/Trunk Assessment   Upper Extremity Assessment: Overall WFL for tasks assessed           Lower Extremity Assessment: Overall WFL for tasks assessed         Communication   Communication: No difficulties  Cognition Arousal/Alertness: Awake/alert Behavior During Therapy: WFL for tasks assessed/performed Overall Cognitive Status: Within Functional Limits for tasks assessed                      General Comments      Exercises        Assessment/Plan    PT Assessment Patent does not need any further PT services  PT Diagnosis Difficulty walking   PT Problem List    PT Treatment Interventions     PT Goals (Current goals can be found in the Care Plan section) Acute Rehab PT Goals PT Goal Formulation: All assessment and education complete, DC therapy    Frequency     Barriers to discharge        Co-evaluation               End of Session   Activity Tolerance: Patient tolerated treatment well Patient left: in bed;with call bell/phone within  reach;with bed alarm set Nurse Communication: Mobility status         Time: [id number] PT Time Calculation (min) (ACUTE ONLY): 11 min   Charges:   PT Evaluation $Initial PT Evaluation Tier I: 1 Procedure     PT G Codes:        Sergi Gellner 10/23/14, 3:45 PM  [facility] PT [phone]

## 2014-09-24 LAB — COMPREHENSIVE METABOLIC PANEL
ALBUMIN: 3.4 g/dL — AB (ref 3.5–5.2)
ALK PHOS: 45 U/L (ref 39–117)
ALT: 16 U/L (ref 0–53)
AST: 12 U/L (ref 0–37)
Anion gap: 8 (ref 5–15)
BILIRUBIN TOTAL: 0.4 mg/dL (ref 0.3–1.2)
BUN: 67 mg/dL — ABNORMAL HIGH (ref 6–23)
CALCIUM: 8.4 mg/dL (ref 8.4–10.5)
CO2: 22 mmol/L (ref 19–32)
CREATININE: 3.55 mg/dL — AB (ref 0.50–1.35)
Chloride: 108 mmol/L (ref 96–112)
GFR, EST AFRICAN AMERICAN: 17 mL/min — AB (ref >90)
GFR, EST NON AFRICAN AMERICAN: 15 mL/min — AB (ref >90)
GLUCOSE: 119 mg/dL — AB (ref 70–99)
POTASSIUM: 4.4 mmol/L (ref 3.5–5.1)
SODIUM: 138 mmol/L (ref 135–145)
TOTAL PROTEIN: 6.5 g/dL (ref 6.0–8.3)

## 2014-09-24 LAB — GLUCOSE, CAPILLARY
GLUCOSE-CAPILLARY: 121 mg/dL — AB (ref 70–99)
GLUCOSE-CAPILLARY: 142 mg/dL — AB (ref 70–99)

## 2014-09-24 LAB — CBC WITH DIFFERENTIAL/PLATELET
BASOS PCT: 1 % (ref 0–1)
Basophils Absolute: 0 10*3/uL (ref 0.0–0.1)
EOS PCT: 7 % — AB (ref 0–5)
Eosinophils Absolute: 0.2 10*3/uL (ref 0.0–0.7)
HCT: 27.1 % — ABNORMAL LOW (ref 39.0–52.0)
HEMOGLOBIN: 8.7 g/dL — AB (ref 13.0–17.0)
LYMPHS ABS: 1.1 10*3/uL (ref 0.7–4.0)
Lymphocytes Relative: 35 % (ref 12–46)
MCH: 29.8 pg (ref 26.0–34.0)
MCHC: 32.1 g/dL (ref 30.0–36.0)
MCV: 92.8 fL (ref 78.0–100.0)
MONOS PCT: 13 % — AB (ref 3–12)
Monocytes Absolute: 0.4 10*3/uL (ref 0.1–1.0)
NEUTROS ABS: 1.3 10*3/uL — AB (ref 1.7–7.7)
Neutrophils Relative %: 44 % (ref 43–77)
Platelets: 143 10*3/uL — ABNORMAL LOW (ref 150–400)
RBC: 2.92 MIL/uL — ABNORMAL LOW (ref 4.22–5.81)
RDW: 12.9 % (ref 11.5–15.5)
WBC: 3 10*3/uL — AB (ref 4.0–10.5)

## 2014-09-24 MED ORDER — ISOSORBIDE MONONITRATE ER 30 MG PO TB24: 30.0000 mg | ORAL_TABLET | Freq: Every day | ORAL | Status: DC

## 2014-09-24 MED ORDER — CLONIDINE HCL 0.2 MG PO TABS: 0.2000 mg | ORAL_TABLET | Freq: Three times a day (TID) | ORAL | Status: DC

## 2014-09-24 NOTE — Discharge Summary (Signed)
Physician Discharge Summary  [name] [name] [id number] DOB: Jun 12, 1929 DOA: 09/20/2014  PCP: Pcp Not In System  Admit date: 09/20/2014 Discharge date: 09/24/2014  Recommendations for Outpatient Follow-up:  1. Pt will need to follow up with PCP in 1-2 weeks post discharge 2. Please obtain BMP in one week Discharge Diagnoses:  Acute diastolic CHF -Restart intravenous furosemide 80 mg twice a day-->switch back to po lasix in am 09/23/14 -Daily weights-->neg 3.5L -Fluid restrict -discharge weight 255 lbs -Echocardiogram--EF 65-70 percent, grade 1 diastolic dysfunction, no WMA -Patient was instructed to weigh himself of daily and call his PCP or cardiologies if he gains more than 2 lbs on successive days or more than 5 lbs over period of 3 days Atypical chest pain -Troponins negative 3  -Patient continues to have some chest discomfort with dizziness on exertion during the hospitalization -Initial EKG showed T-wave inversion in the lateral leads -appreciate cardiology followup--recommended stop minoxidil and Aldactone -09/23/14 Lexiscan Stress test--low risk -optimize medical therapy Malignant hypertension -Continue amlodipine, clonidine, hydralazine, labetalol, - minoxidil discontinued as this may have contributed to the patient's fluid overload - increase clonidine to 0.2 mg 3 times a day -Discontinue nitroglycerin patch -Start Imdur 30mg  daily 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 -Worsening creatinine when compared to 2011 likely represents progression of the patient's renal disease  -Monitor renal function on furosemide  -baseline creatinine 3.1-3.5 from [address] -Serum creatinine 3.55 on day of discharge Diabetes mellitus type 2  -Hemoglobin A1c--6.0 -NovoLog sliding scale  Thrombocytopenia  -Chronic  -No signs of active bleeding   Discharge Condition: stable  Disposition: home  Diet:cardiac Wt Readings from Last 3 Encounters:  09/24/14 116.075 kg  (255 lb 14.4 oz)  04/19/09 126.1 kg (278 lb)  04/11/09 124.286 kg (274 lb)    History of present illness:  Brief history 81 year old male with a history of CKD stage V, hypertension, diabetes mellitus, and hyperlipidemia presented with one-week history of shortness of breath and worsening lower extremity edema. The patient had complained of worsening dyspnea on exertion for 2 days prior to admission. He also complained of worsening orthopnea. He also began complaining of chest discomfort one day prior to admission. Upon admission, chest x-ray revealed increasing vascular congestion and interstitial markings. BNP was 560. The patient was started on intravenous furosemide. Troponins were cycled for his chest pain. They were negative. The patient was started on intravenous furosemide. He had a good clinical response. Patient was -3.5 L for the admission. The patient's renal function remained stable.    Consultants: Cardiology  Discharge Exam: Filed Vitals:   09/24/14 1037  BP: 130/59  Pulse: 63  Temp: 98 F (36.7 C)  Resp:    Filed Vitals:   09/23/14 2010 09/23/14 2141 09/24/14 0507 09/24/14 1037  BP: 130/59 157/70 155/69 130/59  Pulse: 57 56 59 63  Temp: 98 F (36.7 C)  98.2 F (36.8 C) 98 F (36.7 C)  TempSrc: Oral  Oral Oral  Resp: 18  18   Height:      Weight:   116.075 kg (255 lb 14.4 oz)   SpO2: 100%  100% 100%   General: A&O x 3, NAD, pleasant, cooperative Cardiovascular: RRR, no rub, no gallop, no S3 Respiratory: CTAB, no wheeze, no rhonchi Abdomen:soft, nontender, nondistended, positive bowel sounds Extremities: No edema, No lymphangitis, no petechiae  Discharge Instructions      Discharge Instructions    Diet - low sodium heart healthy    Complete by:  As directed  Increase activity slowly    Complete by:  As directed             Medication List    STOP taking these medications        minoxidil 2.5 MG tablet  Commonly known as:  LONITEN      spironolactone 25 MG tablet  Commonly known as:  ALDACTONE      TAKE these medications        amLODipine 10 MG tablet  Commonly known as:  NORVASC  Take 10 mg by mouth daily.     aspirin EC 81 MG tablet  Take 81 mg by mouth daily.     calcitRIOL 0.5 MCG capsule  Commonly known as:  ROCALTROL  Take 1 mcg by mouth every Monday, Wednesday, and Friday.     clobetasol ointment 0.05 %  Commonly known as:  TEMOVATE  Apply 1 application topically daily.     cloNIDine 0.2 MG tablet  Commonly known as:  CATAPRES  Take 1 tablet (0.2 mg total) by mouth 3 (three) times daily.     furosemide 40 MG tablet  Commonly known as:  LASIX  Take 40-80 mg by mouth 2 (two) times daily. Take 2 tablets every morning and take 1 tablet in the afternoon     glipiZIDE 10 MG tablet  Commonly known as:  GLUCOTROL  Take 10 mg by mouth 2 (two) times daily before a meal.     hydrALAZINE 100 MG tablet  Commonly known as:  APRESOLINE  Take 100 mg by mouth 3 (three) times daily.     isosorbide mononitrate 30 MG 24 hr tablet  Commonly known as:  IMDUR  Take 1 tablet (30 mg total) by mouth daily.     labetalol 200 MG tablet  Commonly known as:  NORMODYNE  Take 400 mg by mouth 3 (three) times daily.     latanoprost 0.005 % ophthalmic solution  Commonly known as:  XALATAN  Place 1 drop into the left eye at bedtime.     loratadine 10 MG tablet  Commonly known as:  CLARITIN  Take 10 mg by mouth daily.     omeprazole 40 MG capsule  Commonly known as:  PRILOSEC  Take 40 mg by mouth daily.     Polyethyl Glycol-Propyl Glycol 0.4-0.3 % Soln  Place 1 drop into the right eye at bedtime.     pravastatin 40 MG tablet  Commonly known as:  PRAVACHOL  Take 40 mg by mouth daily.     primidone 50 MG tablet  Commonly known as:  MYSOLINE  Take 50 mg by mouth 3 (three) times daily.     tamsulosin 0.4 MG Caps capsule  Commonly known as:  FLOMAX  Take 0.4 mg by mouth daily.     traMADol 50 MG tablet    Commonly known as:  ULTRAM  Take 50 mg by mouth every 6 (six) hours as needed for moderate pain.         The results of significant diagnostics from this hospitalization (including imaging, microbiology, ancillary and laboratory) are listed below for reference.    Significant Diagnostic Studies: Dg Chest 2 View  09/20/2014   CLINICAL DATA:  Acute onset of chest pain, shortness of breath and cough. Initial encounter.  EXAM: CHEST  2 VIEW  COMPARISON:  Chest radiograph from 06/14/2008  FINDINGS: The lungs are well-aerated. Vascular congestion is noted, with increased interstitial markings, which likely reflects mild interstitial edema. No pleural effusion or  pneumothorax is seen.  The heart is borderline normal in size. No acute osseous abnormalities are seen.  IMPRESSION: Vascular congestion, with increased interstitial markings, likely reflecting mild interstitial edema.   Electronically Signed   By: Garald Balding M.D.   On: 09/20/2014 22:39   Nm Myocar Multi W/spect W/wall Motion / Ef  09/23/2014   CLINICAL DATA:  Chest pain  EXAM: Lexiscan Cardiolite  TECHNIQUE: The patient received IV Lexiscan .4mg  over 15 seconds. 33.0 mCi of Technetium 19m Sestamibi injected at 30 seconds. Quantitative SPECT images were obtained in the vertical, horizontal and short axis planes after a 45 minute delay. Rest images were obtained with similar planes and delay using 10.2 mCi of Technetium 18m Sestamibi.  FINDINGS: ECG: NSR with anterolateral T wave inversion. No change with infusion.  Quantitiative Gated SPECT EF:  50%.  Mild diffuse hypokinesis.  Perfusion Images: There was a small, mild basal to mid inferolateral defect noted both at rest and with Lexiscan stress. There was no significant reversibility.  IMPRESSION: Low risk study. There was a fixed, small mild basal to mid inferolateral perfusion defect noted both at rest and with stress. Cannot rule out small prior MI (versus attenuation). No ischemia. EF 50%,  consider echo to reassess EF.  Dalton Mclean   Electronically Signed   By: Loralie Champagne M.D.   On: 09/23/2014 14:49     Microbiology: No results found for this or any previous visit (from the past 240 hour(s)).   Labs: Basic Metabolic Panel:  Recent Labs Lab 09/20/14 2224 09/21/14 0219 09/22/14 0450 09/23/14 0413 09/24/14 0343  NA 134* 138 139 140 138  K 5.1 5.0 4.6 4.4 4.4  CL 106 108 110 107 108  CO2 20 22 24 23 22   GLUCOSE 216* 123* 126* 138* 119*  BUN 74* 73* 67* 70* 67*  CREATININE 3.60* 3.54* 3.63* 3.85* 3.55*  CALCIUM 8.6 8.6 8.5 8.7 8.4   Liver Function Tests:  Recent Labs Lab 09/21/14 0219 09/22/14 0450 09/23/14 0413 09/24/14 0343  AST 14 12 13 12   ALT 19 15 16 16   ALKPHOS 51 44 47 45  BILITOT 0.4 0.7 0.6 0.4  PROT 6.8 6.0 6.5 6.5  ALBUMIN 3.8 3.4* 3.5 3.4*   No results for input(s): LIPASE, AMYLASE in the last 168 hours. No results for input(s): AMMONIA in the last 168 hours. CBC:  Recent Labs Lab 09/20/14 2224 09/21/14 0219 09/22/14 0450 09/23/14 0413 09/24/14 0343  WBC 2.8* 2.9* 3.7* 3.6* 3.0*  NEUTROABS  --  1.4* 1.8 1.8 1.3*  HGB 9.7* 9.2* 8.7* 9.4* 8.7*  HCT 30.0* 28.7* 27.2* 29.0* 27.1*  MCV 92.0 91.7 92.2 94.2 92.8  PLT 138* 142* 145* 145* 143*   Cardiac Enzymes:  Recent Labs Lab 09/21/14 0219 09/21/14 0803 09/21/14 1333  TROPONINI <0.03 <0.03 <0.03   BNP: Invalid input(s): POCBNP CBG:  Recent Labs Lab 09/23/14 0627 09/23/14 1227 09/23/14 1640 09/23/14 2039 09/24/14 0619  GLUCAP 126* 147* 140* 139* 121*    Time coordinating discharge:  Greater than 30 minutes  Signed:  Eltha Tingley, DO [facility] Pager: [phone] 09/24/2014, 1:18 PM

## 2015-01-17 ENCOUNTER — Emergency Department (HOSPITAL_COMMUNITY)
Admission: EM | Admit: 2015-01-17 | Discharge: 2015-01-17 | Disposition: A | Discharge status: Home / Self Care | Payer: Medicare Other | Attending: Emergency Medicine | Admitting: Emergency Medicine

## 2015-01-17 ENCOUNTER — Encounter (HOSPITAL_COMMUNITY): Payer: Self-pay | Admitting: Physical Medicine and Rehabilitation

## 2015-01-17 ENCOUNTER — Emergency Department (HOSPITAL_COMMUNITY): Payer: Medicare Other

## 2015-01-17 DIAGNOSIS — Z8673 Personal history of transient ischemic attack (TIA), and cerebral infarction without residual deficits: Secondary | ICD-10-CM | POA: Diagnosis not present

## 2015-01-17 DIAGNOSIS — Y9389 Activity, other specified: Secondary | ICD-10-CM | POA: Diagnosis not present

## 2015-01-17 DIAGNOSIS — Z7952 Long term (current) use of systemic steroids: Secondary | ICD-10-CM | POA: Diagnosis not present

## 2015-01-17 DIAGNOSIS — E785 Hyperlipidemia, unspecified: Secondary | ICD-10-CM | POA: Insufficient documentation

## 2015-01-17 DIAGNOSIS — Z79899 Other long term (current) drug therapy: Secondary | ICD-10-CM | POA: Diagnosis not present

## 2015-01-17 DIAGNOSIS — X58XXXA Exposure to other specified factors, initial encounter: Secondary | ICD-10-CM | POA: Diagnosis not present

## 2015-01-17 DIAGNOSIS — S299XXA Unspecified injury of thorax, initial encounter: Secondary | ICD-10-CM | POA: Diagnosis present

## 2015-01-17 DIAGNOSIS — S20211A Contusion of right front wall of thorax, initial encounter: Secondary | ICD-10-CM | POA: Diagnosis not present

## 2015-01-17 DIAGNOSIS — Y92810 Car as the place of occurrence of the external cause: Secondary | ICD-10-CM | POA: Insufficient documentation

## 2015-01-17 DIAGNOSIS — S2231XA Fracture of one rib, right side, initial encounter for closed fracture: Secondary | ICD-10-CM | POA: Insufficient documentation

## 2015-01-17 DIAGNOSIS — Z7982 Long term (current) use of aspirin: Secondary | ICD-10-CM | POA: Insufficient documentation

## 2015-01-17 DIAGNOSIS — I10 Essential (primary) hypertension: Secondary | ICD-10-CM | POA: Diagnosis not present

## 2015-01-17 DIAGNOSIS — E119 Type 2 diabetes mellitus without complications: Secondary | ICD-10-CM | POA: Insufficient documentation

## 2015-01-17 DIAGNOSIS — Y999 Unspecified external cause status: Secondary | ICD-10-CM | POA: Insufficient documentation

## 2015-01-17 LAB — CBC WITH DIFFERENTIAL/PLATELET
Basophils Absolute: 0 10*3/uL (ref 0.0–0.1)
Basophils Relative: 1 % (ref 0–1)
EOS ABS: 0.3 10*3/uL (ref 0.0–0.7)
Eosinophils Relative: 7 % — ABNORMAL HIGH (ref 0–5)
HCT: 29.7 % — ABNORMAL LOW (ref 39.0–52.0)
HEMOGLOBIN: 9.7 g/dL — AB (ref 13.0–17.0)
Lymphocytes Relative: 22 % (ref 12–46)
Lymphs Abs: 0.9 10*3/uL (ref 0.7–4.0)
MCH: 29.9 pg (ref 26.0–34.0)
MCHC: 32.7 g/dL (ref 30.0–36.0)
MCV: 91.7 fL (ref 78.0–100.0)
Monocytes Absolute: 0.3 10*3/uL (ref 0.1–1.0)
Monocytes Relative: 8 % (ref 3–12)
Neutro Abs: 2.5 10*3/uL (ref 1.7–7.7)
Neutrophils Relative %: 62 % (ref 43–77)
Platelets: 127 10*3/uL — ABNORMAL LOW (ref 150–400)
RBC: 3.24 MIL/uL — ABNORMAL LOW (ref 4.22–5.81)
RDW: 12.6 % (ref 11.5–15.5)
WBC: 3.9 10*3/uL — AB (ref 4.0–10.5)

## 2015-01-17 LAB — COMPREHENSIVE METABOLIC PANEL
ALT: 29 U/L (ref 17–63)
ANION GAP: 12 (ref 5–15)
AST: 16 U/L (ref 15–41)
Albumin: 3.8 g/dL (ref 3.5–5.0)
Alkaline Phosphatase: 39 U/L (ref 38–126)
BUN: 90 mg/dL — AB (ref 6–20)
CALCIUM: 9.1 mg/dL (ref 8.9–10.3)
CO2: 22 mmol/L (ref 22–32)
Chloride: 103 mmol/L (ref 101–111)
Creatinine, Ser: 3.7 mg/dL — ABNORMAL HIGH (ref 0.61–1.24)
GFR calc non Af Amer: 14 mL/min — ABNORMAL LOW (ref >60)
GFR, EST AFRICAN AMERICAN: 16 mL/min — AB (ref >60)
GLUCOSE: 127 mg/dL — AB (ref 65–99)
Potassium: 4.6 mmol/L (ref 3.5–5.1)
SODIUM: 137 mmol/L (ref 135–145)
Total Bilirubin: 0.7 mg/dL (ref 0.3–1.2)
Total Protein: 7 g/dL (ref 6.5–8.1)

## 2015-01-17 LAB — I-STAT TROPONIN, ED: TROPONIN I, POC: 0 ng/mL (ref 0.00–0.08)

## 2015-01-17 MED ORDER — OXYCODONE-ACETAMINOPHEN 5-325 MG PO TABS
1.0000 | ORAL_TABLET | Freq: Once | ORAL | Status: AC
  Administered 2015-01-17: 1 via ORAL
  Filled 2015-01-17: qty 1

## 2015-01-17 MED ORDER — OXYCODONE-ACETAMINOPHEN 5-325 MG PO TABS: 2.0000 | ORAL_TABLET | ORAL | Status: DC | PRN

## 2015-01-17 NOTE — Discharge Instructions (Signed)
Hold a pillow against her side, and cough or deep breathe 10 times,  4-5 times during the day.   Chest Contusion A chest contusion is a deep bruise on your chest area. Contusions are the result of an injury that caused bleeding under the skin. A chest contusion may involve bruising of the skin, muscles, or ribs. The contusion may turn blue, purple, or yellow. Minor injuries will give you a painless contusion, but more severe contusions may stay painful and swollen for a few weeks. CAUSES  A contusion is usually caused by a blow, trauma, or direct force to an area of the body. SYMPTOMS   Swelling and redness of the injured area.  Discoloration of the injured area.  Tenderness and soreness of the injured area.  Pain. DIAGNOSIS  The diagnosis can be made by taking a history and performing a physical exam. An X-ray, CT scan, or MRI may be needed to determine if there were any associated injuries, such as broken bones (fractures) or internal injuries. TREATMENT  Often, the best treatment for a chest contusion is resting, icing, and applying cold compresses to the injured area. Deep breathing exercises may be recommended to reduce the risk of pneumonia. Over-the-counter medicines may also be recommended for pain control. HOME CARE INSTRUCTIONS   Put ice on the injured area.  Put ice in a plastic bag.  Place a towel between your skin and the bag.  Leave the ice on for 15-20 minutes, 03-04 times a day.  Only take over-the-counter or prescription medicines as directed by your caregiver. Your caregiver may recommend avoiding anti-inflammatory medicines (aspirin, ibuprofen, and naproxen) for 48 hours because these medicines may increase bruising.  Rest the injured area.  Perform deep-breathing exercises as directed by your caregiver.  Stop smoking if you smoke.  Do not lift objects over 5 pounds (2.3 kg) for 3 days or longer if recommended by your caregiver. SEEK IMMEDIATE MEDICAL CARE  IF:   You have increased bruising or swelling.  You have pain that is getting worse.  You have difficulty breathing.  You have dizziness, weakness, or fainting.  You have blood in your urine or stool.  You cough up or vomit blood.  Your swelling or pain is not relieved with medicines. MAKE SURE YOU:   Understand these instructions.  Will watch your condition.  Will get help right away if you are not doing well or get worse. Document Released: 04/30/2001 Document Revised: 04/29/2012 Document Reviewed: 01/27/2012 [facility] Patient Information 2015 [place], [place]. This information is not intended to replace advice given to you by your health care provider. Make sure you discuss any questions you have with your health care provider.

## 2015-01-17 NOTE — ED Provider Notes (Signed)
CSN: [id number]     Arrival date & time 01/17/15  1144 History   First MD Initiated Contact with Patient 01/17/15 1350     Chief Complaint  Patient presents with  . Chest Pain  . Shortness of Breath      HPI  Patient presents for evaluation of right lower chest pain. Patient describes pain since Saturday in his right lower chest. He states that he spent today putting a new fuel pump in the [place]. It was on the driver side of the car. He leaned over the front fender with his right ribs against the front left quarter panel. He raised up after he had completed his work and we did he felt sudden pain in his right lower ribs as he wedged them against the quarter panel of the car. Did not feel up pop or snap at the time but is CAD persistent pain since then.  No cough, no left-sided pain, no sputum production difficult breathing fever shakes chills no exertional pain  Past Medical History  Diagnosis Date  . Hypertension   . Diabetes mellitus without complication   . Hyperlipidemia   . Stroke     2009   Past Surgical History  Procedure Laterality Date  . Replacement total knee      Left and Right  . Neck surgery      lump removed   History reviewed. No pertinent family history. History  Substance Use Topics  . Smoking status: Never Smoker   . Smokeless tobacco: Never Used  . Alcohol Use: No    Review of Systems  Constitutional: Negative for fever, chills, diaphoresis, appetite change and fatigue.  HENT: Negative for mouth sores, sore throat and trouble swallowing.   Eyes: Negative for visual disturbance.  Respiratory: Negative for cough, chest tightness, shortness of breath and wheezing.   Cardiovascular: Positive for chest pain.  Gastrointestinal: Negative for nausea, vomiting, abdominal pain, diarrhea and abdominal distention.  Endocrine: Negative for polydipsia, polyphagia and polyuria.  Genitourinary: Negative for dysuria, frequency and hematuria.    Musculoskeletal: Negative for gait problem.  Skin: Negative for color change, pallor and rash.  Neurological: Negative for dizziness, syncope, light-headedness and headaches.  Hematological: Does not bruise/bleed easily.  Psychiatric/Behavioral: Negative for behavioral problems and confusion.      Allergies  Flu virus vaccine; Fosinopril sodium; Hydrocodone-acetaminophen; Meloxicam; and Valsartan  Home Medications   Prior to Admission medications   Medication Sig Start Date End Date Taking? Authorizing Provider  amLODipine (NORVASC) 10 MG tablet Take 10 mg by mouth daily.    Historical Provider, MD  aspirin EC 81 MG tablet Take 81 mg by mouth daily.    Historical Provider, MD  calcitRIOL (ROCALTROL) 0.5 MCG capsule Take 1 mcg by mouth every Monday, Wednesday, and Friday.    Historical Provider, MD  clobetasol ointment (TEMOVATE) [id number] % Apply 1 application topically daily.    Historical Provider, MD  cloNIDine (CATAPRES) 0.2 MG tablet Take 1 tablet (0.2 mg total) by mouth 3 (three) times daily. 09/24/14   Orson Eva, MD  furosemide (LASIX) 40 MG tablet Take 40-80 mg by mouth 2 (two) times daily. Take 2 tablets every morning and take 1 tablet in the afternoon    Historical Provider, MD  glipiZIDE (GLUCOTROL) 10 MG tablet Take 10 mg by mouth 2 (two) times daily before a meal.    Historical Provider, MD  hydrALAZINE (APRESOLINE) 100 MG tablet Take 100 mg by mouth 3 (three) times daily.  Historical Provider, MD  isosorbide mononitrate (IMDUR) 30 MG 24 hr tablet Take 1 tablet (30 mg total) by mouth daily. 09/24/14   Orson Eva, MD  labetalol (NORMODYNE) 200 MG tablet Take 400 mg by mouth 3 (three) times daily.    Historical Provider, MD  latanoprost (XALATAN) 0.005 % ophthalmic solution Place 1 drop into the left eye at bedtime.    Historical Provider, MD  loratadine (CLARITIN) 10 MG tablet Take 10 mg by mouth daily.    Historical Provider, MD  omeprazole (PRILOSEC) 40 MG capsule Take 40 mg by  mouth daily.    Historical Provider, MD  oxyCODONE-acetaminophen (PERCOCET/ROXICET) 5-325 MG per tablet Take 2 tablets by mouth every 4 (four) hours as needed. 01/17/15   Tanna Furry, MD  Polyethyl Glycol-Propyl Glycol 0.4-0.3 % SOLN Place 1 drop into the right eye at bedtime.    Historical Provider, MD  pravastatin (PRAVACHOL) 40 MG tablet Take 40 mg by mouth daily.    Historical Provider, MD  primidone (MYSOLINE) 50 MG tablet Take 50 mg by mouth 3 (three) times daily.    Historical Provider, MD  tamsulosin (FLOMAX) 0.4 MG CAPS capsule Take 0.4 mg by mouth daily.    Historical Provider, MD  traMADol (ULTRAM) 50 MG tablet Take 50 mg by mouth every 6 (six) hours as needed for moderate pain.    Historical Provider, MD   BP 185/71 mmHg  Pulse 51  Temp(Src) 97.6 F (36.4 C) (Oral)  Resp 17  SpO2 100% Physical Exam  Constitutional: He is oriented to person, place, and time. He appears well-developed and well-nourished. No distress.  HENT:  Head: Normocephalic.  Eyes: Conjunctivae are normal. Pupils are equal, round, and reactive to light. No scleral icterus.  Neck: Normal range of motion. Neck supple. No thyromegaly present.  Cardiovascular: Normal rate and regular rhythm.  Exam reveals no gallop and no friction rub.   No murmur heard. Pulmonary/Chest: Effort normal and breath sounds normal. No respiratory distress. He has no wheezes. He has no rales.    Abdominal: Soft. Bowel sounds are normal. He exhibits no distension. There is no tenderness. There is no rebound.  Musculoskeletal: Normal range of motion.  Neurological: He is alert and oriented to person, place, and time.  Skin: Skin is warm and dry. No rash noted.  Psychiatric: He has a normal mood and affect. His behavior is normal.    ED Course  Procedures (including critical care time) Labs Review Labs Reviewed  CBC WITH DIFFERENTIAL/PLATELET - Abnormal; Notable for the following:    WBC 3.9 (*)    RBC 3.24 (*)    Hemoglobin 9.7  (*)    HCT 29.7 (*)    Platelets 127 (*)    Eosinophils Relative 7 (*)    All other components within normal limits  COMPREHENSIVE METABOLIC PANEL - Abnormal; Notable for the following:    Glucose, Bld 127 (*)    BUN 90 (*)    Creatinine, Ser 3.70 (*)    GFR calc non Af Amer 14 (*)    GFR calc Af Amer 16 (*)    All other components within normal limits  I-STAT TROPOININ, ED    Imaging Review Dg Chest 2 View  01/17/2015   CLINICAL DATA:  One day history of productive cough ; hypertension  EXAM: CHEST  2 VIEW  COMPARISON:  September 20, 2014  FINDINGS: There is no edema or consolidation. Heart is upper normal in size with pulmonary vascularity within normal limits.  Aorta is tortuous but stable. Soft tissue fullness in the right peritracheal region is stable and is felt to be consistent with great vessel prominence. No adenopathy. There is degenerative change in the thoracic spine.  IMPRESSION: Stable aortic tortuosity. This aortic tortuosity may well be secondary to chronic hypertensive change. No edema or consolidation.   Electronically Signed   By: Lowella Grip III M.D.   On: 01/17/2015 12:57     EKG Interpretation   Date/Time:  Tuesday Jan 17 2015 11:50:38 EDT Ventricular Rate:  62 PR Interval:  216 QRS Duration: 94 QT Interval:  436 QTC Calculation: 442 R Axis:   19 Text Interpretation:  Sinus rhythm with 1st degree A-V block Nonspecific T  wave abnormality Abnormal ECG Confirmed by Jeneen Rinks  MD, [place] ([id number]) on  01/17/2015 1:50:31 PM      MDM   Final diagnoses:  Chest wall contusion, right, initial encounter  Rib fracture, right, closed, initial encounter    Patient with area of minimal crepitus right lower chest. Chest x-ray shows no acute abnormalities. No pleural fluid. No obvious rib fractures. His abdomen is benign to exam and. His findings are consistent with probable nondisplaced rib fracture. He is tolerating this quite well saturating well. Plan is discharge  home. Prescription for Vicodin. Given a dose here. Cough and deep breathe 10 4-5 times per day. Recheck with any worsening symptoms.    Tanna Furry, MD 01/17/15 [phone]

## 2015-01-17 NOTE — ED Notes (Signed)
Pt presents to department for evaluation of diffuse chest pain radiating to abdomen. Ongoing since Saturday. 10/10 pain upon arrival. Respirations unlabored. Pt is alert and oriented x4.

## 2015-01-17 NOTE — ED Notes (Signed)
Dr. Jeneen Rinks at bedside with patient and family.

## 2016-12-04 ENCOUNTER — Emergency Department (HOSPITAL_COMMUNITY): Payer: Medicare Other

## 2016-12-04 ENCOUNTER — Emergency Department (HOSPITAL_COMMUNITY)
Admission: EM | Admit: 2016-12-04 | Discharge: 2016-12-04 | Disposition: A | Discharge status: Home / Self Care | Payer: Medicare Other | Attending: Emergency Medicine | Admitting: Emergency Medicine

## 2016-12-04 ENCOUNTER — Encounter (HOSPITAL_COMMUNITY): Payer: Self-pay | Admitting: *Deleted

## 2016-12-04 DIAGNOSIS — I110 Hypertensive heart disease with heart failure: Secondary | ICD-10-CM | POA: Diagnosis not present

## 2016-12-04 DIAGNOSIS — Z8673 Personal history of transient ischemic attack (TIA), and cerebral infarction without residual deficits: Secondary | ICD-10-CM | POA: Insufficient documentation

## 2016-12-04 DIAGNOSIS — Z7982 Long term (current) use of aspirin: Secondary | ICD-10-CM | POA: Diagnosis not present

## 2016-12-04 DIAGNOSIS — E119 Type 2 diabetes mellitus without complications: Secondary | ICD-10-CM | POA: Diagnosis not present

## 2016-12-04 DIAGNOSIS — I5031 Acute diastolic (congestive) heart failure: Secondary | ICD-10-CM | POA: Diagnosis not present

## 2016-12-04 DIAGNOSIS — R42 Dizziness and giddiness: Secondary | ICD-10-CM | POA: Diagnosis not present

## 2016-12-04 DIAGNOSIS — Z79899 Other long term (current) drug therapy: Secondary | ICD-10-CM | POA: Diagnosis not present

## 2016-12-04 HISTORY — DX: Heart failure, unspecified: I50.9

## 2016-12-04 LAB — I-STAT TROPONIN, ED: TROPONIN I, POC: 0 ng/mL (ref 0.00–0.08)

## 2016-12-04 LAB — I-STAT CHEM 8, ED
BUN: 71 mg/dL — ABNORMAL HIGH (ref 6–20)
CREATININE: 3.7 mg/dL — AB (ref 0.61–1.24)
Calcium, Ion: 1.16 mmol/L (ref 1.15–1.40)
Chloride: 105 mmol/L (ref 101–111)
GLUCOSE: 140 mg/dL — AB (ref 65–99)
HCT: 29 % — ABNORMAL LOW (ref 39.0–52.0)
Hemoglobin: 9.9 g/dL — ABNORMAL LOW (ref 13.0–17.0)
Potassium: 4.1 mmol/L (ref 3.5–5.1)
Sodium: 139 mmol/L (ref 135–145)
TCO2: 23 mmol/L (ref 0–100)

## 2016-12-04 LAB — CBC
HEMATOCRIT: 28.6 % — AB (ref 39.0–52.0)
HEMOGLOBIN: 9.5 g/dL — AB (ref 13.0–17.0)
MCH: 30.9 pg (ref 26.0–34.0)
MCHC: 33.2 g/dL (ref 30.0–36.0)
MCV: 93.2 fL (ref 78.0–100.0)
Platelets: 138 10*3/uL — ABNORMAL LOW (ref 150–400)
RBC: 3.07 MIL/uL — ABNORMAL LOW (ref 4.22–5.81)
RDW: 13.6 % (ref 11.5–15.5)
WBC: 4.1 10*3/uL (ref 4.0–10.5)

## 2016-12-04 LAB — COMPREHENSIVE METABOLIC PANEL
ALT: 15 U/L — ABNORMAL LOW (ref 17–63)
AST: 15 U/L (ref 15–41)
Albumin: 3.6 g/dL (ref 3.5–5.0)
Alkaline Phosphatase: 47 U/L (ref 38–126)
Anion gap: 10 (ref 5–15)
BILIRUBIN TOTAL: 0.7 mg/dL (ref 0.3–1.2)
BUN: 76 mg/dL — AB (ref 6–20)
CO2: 22 mmol/L (ref 22–32)
Calcium: 9.1 mg/dL (ref 8.9–10.3)
Chloride: 105 mmol/L (ref 101–111)
Creatinine, Ser: 3.31 mg/dL — ABNORMAL HIGH (ref 0.61–1.24)
GFR calc Af Amer: 18 mL/min — ABNORMAL LOW (ref >60)
GFR, EST NON AFRICAN AMERICAN: 16 mL/min — AB (ref >60)
Glucose, Bld: 142 mg/dL — ABNORMAL HIGH (ref 65–99)
Potassium: 4.1 mmol/L (ref 3.5–5.1)
Sodium: 137 mmol/L (ref 135–145)
TOTAL PROTEIN: 7.3 g/dL (ref 6.5–8.1)

## 2016-12-04 LAB — APTT: aPTT: 33 seconds (ref 24–36)

## 2016-12-04 LAB — DIFFERENTIAL
Basophils Absolute: 0 10*3/uL (ref 0.0–0.1)
Basophils Relative: 1 %
EOS ABS: 0.3 10*3/uL (ref 0.0–0.7)
EOS PCT: 8 %
Lymphocytes Relative: 27 %
Lymphs Abs: 1.1 10*3/uL (ref 0.7–4.0)
MONO ABS: 0.3 10*3/uL (ref 0.1–1.0)
MONOS PCT: 7 %
Neutro Abs: 2.3 10*3/uL (ref 1.7–7.7)
Neutrophils Relative %: 57 %

## 2016-12-04 LAB — PROTIME-INR
INR: 1.17
Prothrombin Time: 15 seconds (ref 11.4–15.2)

## 2016-12-04 NOTE — ED Notes (Signed)
EDP at bedside  

## 2016-12-04 NOTE — ED Notes (Signed)
Wheeled pt back to room from waiting room, pt placed in gown and on monitor.

## 2016-12-04 NOTE — ED Notes (Addendum)
Patient at CT

## 2016-12-04 NOTE — ED Triage Notes (Signed)
Pt recently started medications for symptoms of Parkinson's and then started having falls up to 1-2 a week.  Pt states his muscles have got weak.  Pt started PT on Monday. Pt fell this am and was feeling dizzy.  Started at 0545 when he woke up feeling dizzy and 2 days ago he fell and hit his head. PT has had previous stroke which left him with left facial droop and harder to raise left arm, pt has equal grip strengths

## 2016-12-04 NOTE — ED Notes (Signed)
Spoke with Rosendo Gros from case management about calling family rt care management.

## 2016-12-04 NOTE — ED Notes (Signed)
[place]: [phone] (H)      [phone] (C)

## 2016-12-04 NOTE — Discharge Planning (Signed)
Mackynzie Woolford J. Clydene Laming, RN, BSN, [employer] [phone] Spoke with pt daughter via telephone regarding discharge planning for [employer]. Offered pt list of home health agencies to choose from.  Pt chose [facility] to render services. Adolphus Birchwood of [facility] notified. Patient made aware that [facility] will be in contact in 24-48 hours.  No DME needs identified at this time.

## 2016-12-04 NOTE — ED Notes (Signed)
Pt is in stable condition upon d/c and is escorted from ED via wheelchair. 

## 2016-12-04 NOTE — ED Notes (Signed)
Patient taking daily home bp meds d/t elevated bp per Dr. Venora Maples.

## 2016-12-05 NOTE — ED Provider Notes (Signed)
[facility] DEPT Provider Note   CSN: [id number] Arrival date & time: 12/04/16  0913     History   Chief Complaint Chief Complaint  Patient presents with  . Dizziness  . Nausea  . Fall    HPI [name] [name] is a 83 y.o. male.  HPI Patient presents to the emergency department regarding mild nausea and dizziness which began today.  Denies abdominal pain.  No chest pain or shortness of breath.  Family reports he has had several falls in the past week.  Patient reports some lightheadedness as well.  No weakness of his arms or legs.  He is supposed to walk with a walker.  Patient is at home with a family member.  Family is concerned about her ability to continue caring for him as he reported he feels like he is weaker.  Symptoms are moderate in severity   Past Medical History:  Diagnosis Date  . CHF (congestive heart failure) ([place])   . Diabetes mellitus without complication ([place])   . Hyperlipidemia   . Hypertension   . Stroke [facility])    2009    Patient Active Problem List   Diagnosis Date Noted  . Chest pain 09/21/2014  . Volume overload 09/21/2014  . Acute CHF ([place]) 09/21/2014  . Acute diastolic CHF (congestive heart failure) ([place]) 09/21/2014  . Chronic renal failure syndrome   . Pain in the chest   . Weakness   . COUGH 04/19/2009  . AORTIC VALVE SCLEROSIS 02/24/2009  . PERIPHERAL EDEMA 11/29/2008  . THROMBOCYTOPENIA 11/25/2008  . PVD 11/25/2008  . RESTING TREMOR 11/11/2008  . BENIGN PROSTATIC HYPERTROPHY, WITH OBSTRUCTION 07/04/2008  . ASTHMA 07/17/2007  . OBSTRUCTIVE SLEEP APNEA 06/22/2007  . Acute bronchitis 06/15/2007  . OVERACTIVE BLADDER 06/15/2007  . CEREBROVASCULAR ACCIDENT, HX OF 03/30/2007  . DIABETES MELLITUS, TYPE II 03/23/2007  . HYPERLIPIDEMIA 03/23/2007  . HYPERTENSION 03/23/2007  . ALLERGIC RHINITIS 03/23/2007  . DEGENERATIVE JOINT DISEASE 03/23/2007  . HEADACHE 03/23/2007    Past Surgical History:  Procedure Laterality Date  . NECK  SURGERY     lump removed  . REPLACEMENT TOTAL KNEE     Left and Right       Home Medications    Prior to Admission medications   Medication Sig Start Date End Date Taking? Authorizing Provider  amLODipine (NORVASC) 10 MG tablet Take 10 mg by mouth daily.    Historical Provider, MD  aspirin EC 81 MG tablet Take 81 mg by mouth daily.    Historical Provider, MD  calcitRIOL (ROCALTROL) 0.5 MCG capsule Take 1 mcg by mouth every Monday, Wednesday, and Friday.    Historical Provider, MD  clobetasol ointment (TEMOVATE) 0.63 % Apply 1 application topically daily.    Historical Provider, MD  cloNIDine (CATAPRES) 0.2 MG tablet Take 1 tablet (0.2 mg total) by mouth 3 (three) times daily. 09/24/14   Orson Eva, MD  furosemide (LASIX) 40 MG tablet Take 40-80 mg by mouth 2 (two) times daily. Take 2 tablets every morning and take 1 tablet in the afternoon    Historical Provider, MD  glipiZIDE (GLUCOTROL) 10 MG tablet Take 10 mg by mouth 2 (two) times daily before a meal.    Historical Provider, MD  hydrALAZINE (APRESOLINE) 100 MG tablet Take 100 mg by mouth 3 (three) times daily.    Historical Provider, MD  isosorbide mononitrate (IMDUR) 30 MG 24 hr tablet Take 1 tablet (30 mg total) by mouth daily. 09/24/14   Orson Eva, MD  labetalol (NORMODYNE) 200 MG tablet Take 400 mg by mouth 3 (three) times daily.    Historical Provider, MD  latanoprost (XALATAN) 0.005 % ophthalmic solution Place 1 drop into the left eye at bedtime.    Historical Provider, MD  loratadine (CLARITIN) 10 MG tablet Take 10 mg by mouth daily.    Historical Provider, MD  omeprazole (PRILOSEC) 40 MG capsule Take 40 mg by mouth daily.    Historical Provider, MD  oxyCODONE-acetaminophen (PERCOCET/ROXICET) 5-325 MG per tablet Take 2 tablets by mouth every 4 (four) hours as needed. 01/17/15   Tanna Furry, MD  Polyethyl Glycol-Propyl Glycol 0.4-0.3 % SOLN Place 1 drop into the right eye at bedtime.    Historical Provider, MD  pravastatin (PRAVACHOL)  40 MG tablet Take 40 mg by mouth daily.    Historical Provider, MD  primidone (MYSOLINE) 50 MG tablet Take 50 mg by mouth 3 (three) times daily.    Historical Provider, MD  tamsulosin (FLOMAX) 0.4 MG CAPS capsule Take 0.4 mg by mouth daily.    Historical Provider, MD  traMADol (ULTRAM) 50 MG tablet Take 50 mg by mouth every 6 (six) hours as needed for moderate pain.    Historical Provider, MD    Family History No family history on file.  Social History Social History  Substance Use Topics  . Smoking status: Never Smoker  . Smokeless tobacco: Never Used  . Alcohol use No     Allergies   Flu virus vaccine; Fosinopril sodium; Hydrocodone-acetaminophen; Meloxicam; and Valsartan   Review of Systems Review of Systems  All other systems reviewed and are negative.    Physical Exam Updated Vital Signs BP (!) 182/76   Pulse (!) 53   Temp 98.2 F (36.8 C) (Oral)   Resp (!) 24   SpO2 96%   Physical Exam  Constitutional: He is oriented to person, place, and time. He appears well-developed and well-nourished.  HENT:  Head: Normocephalic and atraumatic.  Eyes: EOM are normal.  Neck: Normal range of motion.  Cardiovascular: Normal rate, regular rhythm, normal heart sounds and intact distal pulses.   Pulmonary/Chest: Effort normal and breath sounds normal. No respiratory distress.  Abdominal: Soft. He exhibits no distension. There is no tenderness.  Musculoskeletal: Normal range of motion.  Neurological: He is alert and oriented to person, place, and time.  No focal weakness of the upper lower extremity major muscle groups  Skin: Skin is warm and dry.  Psychiatric: He has a normal mood and affect. Judgment normal.  Nursing note and vitals reviewed.    ED Treatments / Results  Labs (all labs ordered are listed, but only abnormal results are displayed) Labs Reviewed  CBC - Abnormal; Notable for the following:       Result Value   RBC 3.07 (*)    Hemoglobin 9.5 (*)    HCT  28.6 (*)    Platelets 138 (*)    All other components within normal limits  COMPREHENSIVE METABOLIC PANEL - Abnormal; Notable for the following:    Glucose, Bld 142 (*)    BUN 76 (*)    Creatinine, Ser 3.31 (*)    ALT 15 (*)    GFR calc non Af Amer 16 (*)    GFR calc Af Amer 18 (*)    All other components within normal limits  I-STAT CHEM 8, ED - Abnormal; Notable for the following:    BUN 71 (*)    Creatinine, Ser 3.70 (*)    Glucose,  Bld 140 (*)    Hemoglobin 9.9 (*)    HCT 29.0 (*)    All other components within normal limits  PROTIME-INR  APTT  DIFFERENTIAL  I-STAT TROPOININ, ED  CBG MONITORING, ED   BUN  Date Value Ref Range Status  12/04/2016 71 (H) 6 - 20 mg/dL Final  12/04/2016 76 (H) 6 - 20 mg/dL Final  01/17/2015 90 (H) 6 - 20 mg/dL Final  09/24/2014 67 (H) 6 - 23 mg/dL Final   Creatinine, Ser  Date Value Ref Range Status  12/04/2016 3.70 (H) 0.61 - 1.24 mg/dL Final  12/04/2016 3.31 (H) 0.61 - 1.24 mg/dL Final  01/17/2015 3.70 (H) 0.61 - 1.24 mg/dL Final  09/24/2014 3.55 (H) 0.50 - 1.35 mg/dL Final   Hemoglobin  Date Value Ref Range Status  12/04/2016 9.9 (L) 13.0 - 17.0 g/dL Final  12/04/2016 9.5 (L) 13.0 - 17.0 g/dL Final  01/17/2015 9.7 (L) 13.0 - 17.0 g/dL Final  09/24/2014 8.7 (L) 13.0 - 17.0 g/dL Final      EKG  EKG Interpretation  Date/Time:  Wednesday December 04 2016 09:19:52 EDT Ventricular Rate:  61 PR Interval:  216 QRS Duration: 104 QT Interval:  424 QTC Calculation: 426 R Axis:   52 Text Interpretation:  Sinus rhythm with 1st degree A-V block Possible Anterior infarct , age undetermined ST & T wave abnormality, consider inferior ischemia Abnormal ECG No significant change was found Confirmed by Romanda Turrubiates  MD, Furkan Keenum ([id number]) on 12/04/2016 11:48:44 AM       Radiology Ct Head Wo Contrast  Result Date: 12/04/2016 CLINICAL DATA:  Dizziness with fall this morning, striking forehead. No loss of consciousness. Initial encounter. EXAM: CT HEAD  WITHOUT CONTRAST TECHNIQUE: Contiguous axial images were obtained from the base of the skull through the vertex without intravenous contrast. COMPARISON:  Head CT 03/23/2007 and MRI 03/24/2007 FINDINGS: Brain: There is no evidence of acute cortical infarct, intracranial hemorrhage, mass, midline shift, or extra-axial fluid collection. There is a subcentimeter infarct in the superior left cerebellum which is new but chronic in appearance. There are also chronic lacunar infarcts in the right thalamus and basal ganglia. Moderate cerebral atrophy has slightly progressed. Cerebral white matter hypodensities are nonspecific but compatible with moderate chronic small vessel ischemic disease. Vascular: Calcified atherosclerosis at the skullbase. No hyperdense vessel. Skull: No fracture or focal osseous lesion. Sinuses/Orbits: Minimal paranasal sinus mucosal thickening. Clear mastoid air cells. Bilateral cataract extraction. Other: Prior left parotidectomy. IMPRESSION: 1. No evidence of acute intracranial abnormality. 2. Moderate cerebral atrophy and chronic small vessel ischemic disease including an interval chronic left cerebellar infarct. Electronically Signed   By: Logan Bores M.D.   On: 12/04/2016 10:17    Procedures Procedures (including critical care time)  Medications Ordered in ED Medications - No data to display   Initial Impression / Assessment and Plan / ED Course  I have reviewed the triage vital signs and the nursing notes.  Pertinent labs & imaging results that were available during my care of the patient were reviewed by me and considered in my medical decision making (see chart for details).     Stable vital signs and labs here in the emergency department.  No indication for additional workup at this time.  I have involved case management to help with home health resources including physical therapy, occupational therapy, age, social work.  Patient family understand to return the emergency  department for new or worsening symptoms.  No indication for acute hospitalization at  this time  Final Clinical Impressions(s) / ED Diagnoses   Final diagnoses:  Dizziness    New Prescriptions Discharge Medication List as of 12/04/2016 12:47 PM       Jola Schmidt, MD 12/05/16 1559

## 2017-10-25 ENCOUNTER — Other Ambulatory Visit: Payer: Self-pay

## 2017-10-25 ENCOUNTER — Emergency Department (HOSPITAL_BASED_OUTPATIENT_CLINIC_OR_DEPARTMENT_OTHER)
Admit: 2017-10-25 | Discharge: 2017-10-25 | Disposition: A | Discharge status: Home / Self Care | Payer: Medicare Other | Attending: Emergency Medicine | Admitting: Emergency Medicine

## 2017-10-25 ENCOUNTER — Emergency Department (HOSPITAL_COMMUNITY): Payer: Medicare Other

## 2017-10-25 ENCOUNTER — Inpatient Hospital Stay (HOSPITAL_COMMUNITY)
Admission: EM | Admit: 2017-10-25 | Discharge: 2017-10-29 | DRG: 291 | Disposition: A | Discharge status: Home Health | Payer: Medicare Other | Attending: Family Medicine | Admitting: Family Medicine

## 2017-10-25 ENCOUNTER — Encounter (HOSPITAL_COMMUNITY): Payer: Self-pay | Admitting: *Deleted

## 2017-10-25 DIAGNOSIS — Z66 Do not resuscitate: Secondary | ICD-10-CM | POA: Diagnosis present

## 2017-10-25 DIAGNOSIS — E875 Hyperkalemia: Secondary | ICD-10-CM | POA: Diagnosis present

## 2017-10-25 DIAGNOSIS — Z833 Family history of diabetes mellitus: Secondary | ICD-10-CM

## 2017-10-25 DIAGNOSIS — Z885 Allergy status to narcotic agent status: Secondary | ICD-10-CM

## 2017-10-25 DIAGNOSIS — Z887 Allergy status to serum and vaccine status: Secondary | ICD-10-CM

## 2017-10-25 DIAGNOSIS — I5033 Acute on chronic diastolic (congestive) heart failure: Secondary | ICD-10-CM | POA: Diagnosis not present

## 2017-10-25 DIAGNOSIS — N184 Chronic kidney disease, stage 4 (severe): Secondary | ICD-10-CM

## 2017-10-25 DIAGNOSIS — I739 Peripheral vascular disease, unspecified: Secondary | ICD-10-CM | POA: Diagnosis present

## 2017-10-25 DIAGNOSIS — E1122 Type 2 diabetes mellitus with diabetic chronic kidney disease: Secondary | ICD-10-CM

## 2017-10-25 DIAGNOSIS — L84 Corns and callosities: Secondary | ICD-10-CM | POA: Diagnosis present

## 2017-10-25 DIAGNOSIS — M79671 Pain in right foot: Secondary | ICD-10-CM

## 2017-10-25 DIAGNOSIS — M79609 Pain in unspecified limb: Secondary | ICD-10-CM | POA: Diagnosis not present

## 2017-10-25 DIAGNOSIS — I132 Hypertensive heart and chronic kidney disease with heart failure and with stage 5 chronic kidney disease, or end stage renal disease: Principal | ICD-10-CM | POA: Diagnosis present

## 2017-10-25 DIAGNOSIS — I10 Essential (primary) hypertension: Secondary | ICD-10-CM | POA: Diagnosis not present

## 2017-10-25 DIAGNOSIS — E785 Hyperlipidemia, unspecified: Secondary | ICD-10-CM

## 2017-10-25 DIAGNOSIS — Z8679 Personal history of other diseases of the circulatory system: Secondary | ICD-10-CM

## 2017-10-25 DIAGNOSIS — Z888 Allergy status to other drugs, medicaments and biological substances status: Secondary | ICD-10-CM

## 2017-10-25 DIAGNOSIS — E1129 Type 2 diabetes mellitus with other diabetic kidney complication: Secondary | ICD-10-CM

## 2017-10-25 DIAGNOSIS — N185 Chronic kidney disease, stage 5: Secondary | ICD-10-CM | POA: Diagnosis not present

## 2017-10-25 DIAGNOSIS — G4733 Obstructive sleep apnea (adult) (pediatric): Secondary | ICD-10-CM | POA: Diagnosis present

## 2017-10-25 DIAGNOSIS — Z87892 Personal history of anaphylaxis: Secondary | ICD-10-CM

## 2017-10-25 DIAGNOSIS — I69354 Hemiplegia and hemiparesis following cerebral infarction affecting left non-dominant side: Secondary | ICD-10-CM

## 2017-10-25 DIAGNOSIS — Z96653 Presence of artificial knee joint, bilateral: Secondary | ICD-10-CM | POA: Diagnosis present

## 2017-10-25 DIAGNOSIS — D61818 Other pancytopenia: Secondary | ICD-10-CM | POA: Diagnosis present

## 2017-10-25 DIAGNOSIS — I509 Heart failure, unspecified: Secondary | ICD-10-CM | POA: Diagnosis not present

## 2017-10-25 DIAGNOSIS — I350 Nonrheumatic aortic (valve) stenosis: Secondary | ICD-10-CM | POA: Diagnosis present

## 2017-10-25 DIAGNOSIS — E1151 Type 2 diabetes mellitus with diabetic peripheral angiopathy without gangrene: Secondary | ICD-10-CM | POA: Diagnosis present

## 2017-10-25 DIAGNOSIS — Z87891 Personal history of nicotine dependence: Secondary | ICD-10-CM

## 2017-10-25 DIAGNOSIS — Z7984 Long term (current) use of oral hypoglycemic drugs: Secondary | ICD-10-CM

## 2017-10-25 DIAGNOSIS — M109 Gout, unspecified: Secondary | ICD-10-CM | POA: Diagnosis present

## 2017-10-25 HISTORY — DX: Chronic kidney disease, stage 4 (severe): N18.4

## 2017-10-25 LAB — GLUCOSE, CAPILLARY
GLUCOSE-CAPILLARY: 125 mg/dL — AB (ref 65–99)
Glucose-Capillary: 195 mg/dL — ABNORMAL HIGH (ref 65–99)

## 2017-10-25 LAB — CBC WITH DIFFERENTIAL/PLATELET
Basophils Absolute: 0 10*3/uL (ref 0.0–0.1)
Basophils Relative: 0 %
EOS ABS: 0.1 10*3/uL (ref 0.0–0.7)
EOS PCT: 3 %
HCT: 28.5 % — ABNORMAL LOW (ref 39.0–52.0)
Hemoglobin: 9.3 g/dL — ABNORMAL LOW (ref 13.0–17.0)
LYMPHS ABS: 0.8 10*3/uL (ref 0.7–4.0)
Lymphocytes Relative: 29 %
MCH: 30.2 pg (ref 26.0–34.0)
MCHC: 32.6 g/dL (ref 30.0–36.0)
MCV: 92.5 fL (ref 78.0–100.0)
MONOS PCT: 9 %
Monocytes Absolute: 0.2 10*3/uL (ref 0.1–1.0)
Neutro Abs: 1.6 10*3/uL — ABNORMAL LOW (ref 1.7–7.7)
Neutrophils Relative %: 59 %
PLATELETS: 124 10*3/uL — AB (ref 150–400)
RBC: 3.08 MIL/uL — ABNORMAL LOW (ref 4.22–5.81)
RDW: 14 % (ref 11.5–15.5)
WBC: 2.8 10*3/uL — ABNORMAL LOW (ref 4.0–10.5)

## 2017-10-25 LAB — BASIC METABOLIC PANEL
Anion gap: 12 (ref 5–15)
BUN: 88 mg/dL — ABNORMAL HIGH (ref 6–20)
CHLORIDE: 102 mmol/L (ref 101–111)
CO2: 20 mmol/L — ABNORMAL LOW (ref 22–32)
CREATININE: 3.9 mg/dL — AB (ref 0.61–1.24)
Calcium: 9.1 mg/dL (ref 8.9–10.3)
GFR calc Af Amer: 15 mL/min — ABNORMAL LOW (ref >60)
GFR, EST NON AFRICAN AMERICAN: 13 mL/min — AB (ref >60)
Glucose, Bld: 115 mg/dL — ABNORMAL HIGH (ref 65–99)
POTASSIUM: 5.3 mmol/L — AB (ref 3.5–5.1)
SODIUM: 134 mmol/L — AB (ref 135–145)

## 2017-10-25 LAB — URINALYSIS, ROUTINE W REFLEX MICROSCOPIC
BACTERIA UA: NONE SEEN
BILIRUBIN URINE: NEGATIVE
Glucose, UA: NEGATIVE mg/dL
Hgb urine dipstick: NEGATIVE
Ketones, ur: NEGATIVE mg/dL
Leukocytes, UA: NEGATIVE
NITRITE: NEGATIVE
PH: 6 (ref 5.0–8.0)
Protein, ur: 30 mg/dL — AB
SPECIFIC GRAVITY, URINE: 1.008 (ref 1.005–1.030)

## 2017-10-25 LAB — BRAIN NATRIURETIC PEPTIDE: B Natriuretic Peptide: 1670.6 pg/mL — ABNORMAL HIGH (ref 0.0–100.0)

## 2017-10-25 LAB — I-STAT TROPONIN, ED: TROPONIN I, POC: 0 ng/mL (ref 0.00–0.08)

## 2017-10-25 LAB — D-DIMER, QUANTITATIVE: D-Dimer, Quant: 0.89 ug/mL-FEU — ABNORMAL HIGH (ref 0.00–0.50)

## 2017-10-25 MED ORDER — FUROSEMIDE 10 MG/ML IJ SOLN
80.0000 mg | Freq: Two times a day (BID) | INTRAMUSCULAR | Status: DC
  Filled 2017-10-25: qty 8

## 2017-10-25 MED ORDER — INSULIN ASPART 100 UNIT/ML ~~LOC~~ SOLN
0.0000 [IU] | Freq: Three times a day (TID) | SUBCUTANEOUS | Status: DC
  Administered 2017-10-26 – 2017-10-29 (×3): 1 [IU] via SUBCUTANEOUS

## 2017-10-25 MED ORDER — LATANOPROST 0.005 % OP SOLN
1.0000 [drp] | Freq: Every day | OPHTHALMIC | Status: DC
  Administered 2017-10-25 – 2017-10-28 (×4): 1 [drp] via OPHTHALMIC
  Filled 2017-10-25: qty 2.5

## 2017-10-25 MED ORDER — CLONIDINE HCL 0.3 MG PO TABS
0.3000 mg | ORAL_TABLET | Freq: Three times a day (TID) | ORAL | Status: DC
  Administered 2017-10-25 – 2017-10-29 (×11): 0.3 mg via ORAL
  Filled 2017-10-25 (×12): qty 1

## 2017-10-25 MED ORDER — TAMSULOSIN HCL 0.4 MG PO CAPS
0.4000 mg | ORAL_CAPSULE | Freq: Every day | ORAL | Status: DC
  Administered 2017-10-26 – 2017-10-29 (×4): 0.4 mg via ORAL
  Filled 2017-10-25 (×4): qty 1

## 2017-10-25 MED ORDER — FUROSEMIDE 10 MG/ML IJ SOLN
120.0000 mg | Freq: Once | INTRAVENOUS | Status: DC
  Filled 2017-10-25: qty 12

## 2017-10-25 MED ORDER — PRAVASTATIN SODIUM 40 MG PO TABS
40.0000 mg | ORAL_TABLET | Freq: Every day | ORAL | Status: DC
  Administered 2017-10-26 – 2017-10-29 (×4): 40 mg via ORAL
  Filled 2017-10-25 (×4): qty 1

## 2017-10-25 MED ORDER — CLONIDINE HCL 0.2 MG PO TABS: 0.2000 mg | ORAL_TABLET | Freq: Three times a day (TID) | ORAL | Status: DC

## 2017-10-25 MED ORDER — ACETAMINOPHEN 650 MG RE SUPP: 650.0000 mg | Freq: Four times a day (QID) | RECTAL | Status: DC | PRN

## 2017-10-25 MED ORDER — FUROSEMIDE 10 MG/ML IJ SOLN
80.0000 mg | Freq: Once | INTRAMUSCULAR | Status: DC
  Administered 2017-10-25: 80 mg via INTRAVENOUS
  Filled 2017-10-25: qty 8

## 2017-10-25 MED ORDER — POLYVINYL ALCOHOL 1.4 % OP SOLN
1.0000 [drp] | Freq: Every day | OPHTHALMIC | Status: DC
  Administered 2017-10-25 – 2017-10-28 (×4): 1 [drp] via OPHTHALMIC
  Filled 2017-10-25: qty 15

## 2017-10-25 MED ORDER — AMLODIPINE BESYLATE 10 MG PO TABS
10.0000 mg | ORAL_TABLET | Freq: Every day | ORAL | Status: DC
  Administered 2017-10-25 – 2017-10-28 (×4): 10 mg via ORAL
  Filled 2017-10-25 (×4): qty 1

## 2017-10-25 MED ORDER — GABAPENTIN 100 MG PO CAPS
100.0000 mg | ORAL_CAPSULE | Freq: Every day | ORAL | Status: DC
  Administered 2017-10-25 – 2017-10-28 (×4): 100 mg via ORAL
  Filled 2017-10-25 (×4): qty 1

## 2017-10-25 MED ORDER — PRIMIDONE 50 MG PO TABS
50.0000 mg | ORAL_TABLET | ORAL | Status: DC
  Administered 2017-10-26 – 2017-10-29 (×8): 50 mg via ORAL
  Filled 2017-10-25 (×8): qty 1

## 2017-10-25 MED ORDER — POLYETHYL GLYCOL-PROPYL GLYCOL 0.4-0.3 % OP SOLN: 1.0000 [drp] | Freq: Every day | OPHTHALMIC | Status: DC

## 2017-10-25 MED ORDER — GABAPENTIN 100 MG PO CAPS
200.0000 mg | ORAL_CAPSULE | Freq: Every day | ORAL | Status: DC
  Administered 2017-10-26 – 2017-10-29 (×4): 200 mg via ORAL
  Filled 2017-10-25 (×4): qty 2

## 2017-10-25 MED ORDER — DICLOFENAC SODIUM 1 % TD GEL
4.0000 g | Freq: Four times a day (QID) | TRANSDERMAL | Status: DC
  Administered 2017-10-25 – 2017-10-27 (×6): 4 g via TOPICAL
  Filled 2017-10-25: qty 100

## 2017-10-25 MED ORDER — HEPARIN SODIUM (PORCINE) 5000 UNIT/ML IJ SOLN
5000.0000 [IU] | Freq: Three times a day (TID) | INTRAMUSCULAR | Status: DC
  Administered 2017-10-25 – 2017-10-29 (×11): 5000 [IU] via SUBCUTANEOUS
  Filled 2017-10-25 (×12): qty 1

## 2017-10-25 MED ORDER — ACETAMINOPHEN 325 MG PO TABS: 650.0000 mg | ORAL_TABLET | Freq: Four times a day (QID) | ORAL | Status: DC | PRN

## 2017-10-25 MED ORDER — PRIMIDONE 250 MG PO TABS
250.0000 mg | ORAL_TABLET | Freq: Every day | ORAL | Status: DC
  Administered 2017-10-25 – 2017-10-28 (×4): 250 mg via ORAL
  Filled 2017-10-25 (×4): qty 1

## 2017-10-25 MED ORDER — CLONIDINE HCL 0.1 MG PO TABS: 0.1000 mg | ORAL_TABLET | Freq: Three times a day (TID) | ORAL | Status: DC

## 2017-10-25 MED ORDER — ISOSORBIDE MONONITRATE ER 60 MG PO TB24
60.0000 mg | ORAL_TABLET | Freq: Every evening | ORAL | Status: DC
  Administered 2017-10-26 – 2017-10-28 (×3): 60 mg via ORAL
  Filled 2017-10-25 (×3): qty 1

## 2017-10-25 MED ORDER — LABETALOL HCL 200 MG PO TABS
400.0000 mg | ORAL_TABLET | Freq: Three times a day (TID) | ORAL | Status: DC
  Administered 2017-10-26 – 2017-10-29 (×10): 400 mg via ORAL
  Filled 2017-10-25 (×11): qty 2

## 2017-10-25 MED ORDER — HYDRALAZINE HCL 50 MG PO TABS
100.0000 mg | ORAL_TABLET | Freq: Three times a day (TID) | ORAL | Status: DC
  Administered 2017-10-25 – 2017-10-27 (×7): 100 mg via ORAL
  Filled 2017-10-25 (×7): qty 2

## 2017-10-25 MED ORDER — NITROGLYCERIN 2 % TD OINT
1.0000 [in_us] | TOPICAL_OINTMENT | Freq: Once | TRANSDERMAL | Status: AC
  Administered 2017-10-25: 1 [in_us] via TOPICAL
  Filled 2017-10-25: qty 1

## 2017-10-25 MED ORDER — PRIMIDONE 50 MG PO TABS: 50.0000 mg | ORAL_TABLET | ORAL | Status: DC

## 2017-10-25 MED ORDER — ASPIRIN EC 81 MG PO TBEC
81.0000 mg | DELAYED_RELEASE_TABLET | Freq: Every day | ORAL | Status: DC
  Administered 2017-10-26 – 2017-10-28 (×3): 81 mg via ORAL
  Filled 2017-10-25 (×4): qty 1

## 2017-10-25 MED ORDER — ALBUTEROL SULFATE (2.5 MG/3ML) 0.083% IN NEBU: 2.5000 mg | INHALATION_SOLUTION | RESPIRATORY_TRACT | Status: DC | PRN

## 2017-10-25 MED ORDER — TRAMADOL HCL 50 MG PO TABS: 50.0000 mg | ORAL_TABLET | Freq: Four times a day (QID) | ORAL | Status: DC | PRN

## 2017-10-25 NOTE — ED Provider Notes (Signed)
[facility] EMERGENCY DEPARTMENT Provider Note   CSN: [id number] Arrival date & time: 10/25/17  1030     History   Chief Complaint Chief Complaint  Patient presents with  . Foot Pain    HPI [name] [name] is a 84 y.o. male with heart failure with preserved ejection fraction, kidney disease, hypertension, diabetes on oral medications, hyperlipidemia, aortic valve stenosis is here for evaluation of pain and swelling to bilateral feet, worse on the right, since yesterday. Notes that he has a known callus to the bottom of his left foot, has been given him problems for quite some time. Recently noticed prominence and pain to the right lateral aspect of his right foot. States the pain is all over both of his feet. Pain is worse with walking and palpation. Woke up this morning and noticed that the right foot was more swollen than the left. Associated symptoms include "feeling out of breath" with chest tightness with exertion for the last 3 days. Has been using her albuterol more frequently throughout the day for the last 3 days when he usually does not need to daily. Usually sleeps with one pillow and states that this has not changed, actually slept completely flat on his back last night. Has been compliant with all medications and salt restrictions. Current weight is around 223, dry weight recommended to 220-225.  Denies exertional chest pain, palpitations, dizziness, cough, orthopnea, PND, fevers, chills, abdominal pain or swelling. Denies trauma to his feet. No h/o DVT/PE, recent travel or immobilization or surgeries, malignancy, estrogen use. No anticoagulants.    HPI  Past Medical History:  Diagnosis Date  . CHF (congestive heart failure) ([place])   . Diabetes mellitus without complication ([place])   . Hyperlipidemia   . Hypertension   . Stroke [facility])    2009    Patient Active Problem List   Diagnosis Date Noted  . Chest pain 09/21/2014  . Volume overload 09/21/2014    . Acute CHF ([place]) 09/21/2014  . Acute diastolic CHF (congestive heart failure) ([place]) 09/21/2014  . Chronic renal failure syndrome   . Pain in the chest   . Weakness   . COUGH 04/19/2009  . AORTIC VALVE SCLEROSIS 02/24/2009  . PERIPHERAL EDEMA 11/29/2008  . THROMBOCYTOPENIA 11/25/2008  . PVD 11/25/2008  . RESTING TREMOR 11/11/2008  . BENIGN PROSTATIC HYPERTROPHY, WITH OBSTRUCTION 07/04/2008  . ASTHMA 07/17/2007  . OBSTRUCTIVE SLEEP APNEA 06/22/2007  . Acute bronchitis 06/15/2007  . OVERACTIVE BLADDER 06/15/2007  . CEREBROVASCULAR ACCIDENT, HX OF 03/30/2007  . DIABETES MELLITUS, TYPE II 03/23/2007  . HYPERLIPIDEMIA 03/23/2007  . HYPERTENSION 03/23/2007  . ALLERGIC RHINITIS 03/23/2007  . DEGENERATIVE JOINT DISEASE 03/23/2007  . HEADACHE 03/23/2007    Past Surgical History:  Procedure Laterality Date  . NECK SURGERY     lump removed  . REPLACEMENT TOTAL KNEE     Left and Right       Home Medications    Prior to Admission medications   Medication Sig Start Date End Date Taking? Authorizing Provider  acetaminophen (TYLENOL) 500 MG tablet Take 1,000 mg by mouth every 6 (six) hours as needed.   Yes [provider]  amLODipine (NORVASC) 10 MG tablet Take 10 mg by mouth at bedtime.    Yes [provider]  aspirin EC 81 MG tablet Take 81 mg by mouth daily.   Yes [provider]  augmented betamethasone dipropionate (DIPROLENE-AF) 0.05 % ointment  09/23/17  Yes [provider]  cloNIDine (  CATAPRES) 0.1 MG tablet Take 0.1 mg by mouth See admin instructions. Takes three times a day Take at 0900 1300 and at 2200 10/13/17  Yes [provider]  cloNIDine (CATAPRES) 0.2 MG tablet Take 1 tablet (0.2 mg total) by mouth 3 (three) times daily. 09/24/14  Yes TatShanon Brow, MD  furosemide (LASIX) 40 MG tablet Take 40-80 mg by mouth 2 (two) times daily. Take 80 mg every morning and take 40 mg tablet at 1400   Yes [provider]  gabapentin  (NEURONTIN) 100 MG capsule Take 100-200 mg by mouth See admin instructions. Takes three times a day Take 200 mg at 0600 and 100 at 2200 09/23/17  Yes [provider]  hydrALAZINE (APRESOLINE) 100 MG tablet Take 100 mg by mouth 3 (three) times daily. Must take takes at 0900, 1300, and 2200 to prevent drop in the blood pressure   Yes [provider]  isosorbide mononitrate (IMDUR) 30 MG 24 hr tablet Take 1 tablet (30 mg total) by mouth daily. Patient taking differently: Take 60 mg by mouth every evening. Takes at 1800 with the 0.1 mg Clonidine 09/24/14  Yes Tat, David, MD  labetalol (NORMODYNE) 200 MG tablet Take 400 mg by mouth 3 (three) times daily.   Yes [provider]  latanoprost (XALATAN) 0.005 % ophthalmic solution Place 1 drop into the left eye at bedtime.   Yes [provider]  levalbuterol (XOPENEX HFA) 45 MCG/ACT inhaler Inhale 2 puffs into the lungs every 4 (four) hours as needed for wheezing.   Yes [provider]  montelukast (SINGULAIR) 10 MG tablet Take 10 mg by mouth daily. 10/09/17  Yes [provider]  Polyethyl Glycol-Propyl Glycol 0.4-0.3 % SOLN Place 1 drop into the right eye at bedtime.   Yes [provider]  pravastatin (PRAVACHOL) 40 MG tablet Take 40 mg by mouth daily.   Yes [provider]  primidone (MYSOLINE) 50 MG tablet Take 50-250 mg by mouth See admin instructions. Take three times a day 50 mg at 9:00 and 14:00 and 250 mg at 2200   Yes [provider]  tamsulosin (FLOMAX) 0.4 MG CAPS capsule Take 0.4 mg by mouth daily.   Yes [provider]  traMADol (ULTRAM) 50 MG tablet Take 50 mg by mouth every 6 (six) hours as needed for moderate pain.   Yes [provider]  oxyCODONE-acetaminophen (PERCOCET/ROXICET) 5-325 MG per tablet Take 2 tablets by mouth every 4 (four) hours as needed. Patient not taking: Reported on 10/25/2017 01/17/15   Tanna Furry, MD    Family History History  reviewed. No pertinent family history.  Social History Social History   Tobacco Use  . Smoking status: Never Smoker  . Smokeless tobacco: Never Used  Substance Use Topics  . Alcohol use: No  . Drug use: No     Allergies   Flu virus vaccine; Fosinopril sodium; Hydrocodone-acetaminophen; Meloxicam; and Valsartan   Review of Systems Review of Systems  Respiratory: Positive for chest tightness and shortness of breath.   Cardiovascular: Positive for leg swelling (R>L).  Musculoskeletal: Positive for arthralgias (bilateral feet R>L).  All other systems reviewed and are negative.    Physical Exam Updated Vital Signs BP (!) 178/68 (BP Location: Right Arm)   Pulse (!) 50   Temp 97.7 F (36.5 C) (Oral)   Resp 20   SpO2 97%   Physical Exam  Constitutional: He appears well-developed and well-nourished.  NAD. Non toxic.   HENT:  Head:  Normocephalic and atraumatic.  Nose: Nose normal.  Moist mucous membranes. Tonsils and oropharynx normal  Eyes: Conjunctivae, EOM and lids are normal.  Neck: Trachea normal and normal range of motion.  Neck is supple. Trachea midline. No cervical adenopathy. No obvious JVD  Cardiovascular: Normal rate, regular rhythm, S1 normal and S2 normal.  Murmur heard. Pulses:      Carotid pulses are 2+ on the right side, and 2+ on the left side.      Radial pulses are 2+ on the right side, and 2+ on the left side.       Dorsalis pedis pulses are 1+ on the right side, and 2+ on the left side.  3+ pitting edema to right mid foot with 2 pitting edema to lower leg up to knee. 2+ pitting edema to left mid foot with 1+ pitting edema to left lower leg. RRR. No orthopnea. No calf tenderness. Systolic murmur.   Pulmonary/Chest: Effort normal and breath sounds normal. No respiratory distress. He has no decreased breath sounds. He has no rhonchi.  No crackles or wheezing.  Abdominal: Soft. Bowel sounds are normal. There is no tenderness.  No epigastric tenderness.  No distention.   Musculoskeletal: He exhibits edema.  Prominent base of 5th metatarsal of right foot, tender. No obvious overlying skin changes.   Neurological: He is alert. GCS eye subscore is 4. GCS verbal subscore is 5. GCS motor subscore is 6.  Skin: Skin is warm and dry. Capillary refill takes less than 2 seconds.  No rash to chest wall. Hyperkeratotic and hyperpigemented toe nails. Hyperpigmented skin to bilateral shins. Small, mildly tender callus to plantar lateral aspect of the left foot without erythema, edema, warmth, fluctuance or drainage.   Psychiatric: He has a normal mood and affect. His speech is normal and behavior is normal. Judgment and thought content normal. Cognition and memory are normal.     ED Treatments / Results  Labs (all labs ordered are listed, but only abnormal results are displayed) Labs Reviewed  BASIC METABOLIC PANEL - Abnormal; Notable for the following components:      Result Value   Sodium 134 (*)    Potassium 5.3 (*)    CO2 20 (*)    Glucose, Bld 115 (*)    BUN 88 (*)    Creatinine, Ser 3.90 (*)    GFR calc non Af Amer 13 (*)    GFR calc Af Amer 15 (*)    All other components within normal limits  BRAIN NATRIURETIC PEPTIDE - Abnormal; Notable for the following components:   B Natriuretic Peptide 1,670.6 (*)    All other components within normal limits  D-DIMER, QUANTITATIVE (NOT AT [facility]) - Abnormal; Notable for the following components:   D-Dimer, Quant 0.89 (*)    All other components within normal limits  CBC WITH DIFFERENTIAL/PLATELET  URINALYSIS, ROUTINE W REFLEX MICROSCOPIC  I-STAT TROPONIN, ED    EKG  EKG Interpretation  Date/Time:  Saturday October 25 2017 14:21:07 EST Ventricular Rate:  61 PR Interval:    QRS Duration: 128 QT Interval:  487 QTC Calculation: 491 R Axis:   69 Text Interpretation:  Sinus rhythm Probable left atrial enlargement LVH with IVCD and secondary repol abnrm Anterior ST elevation, probably due to LVH  Borderline prolonged QT interval No significant change since last tracing Confirmed by Orlie Dakin [phone]) on 10/25/2017 2:47:42 PM       Radiology Dg Chest 2 View  Result Date: 10/25/2017 CLINICAL DATA:  Shortness  of Breath EXAM: CHEST - 2 VIEW COMPARISON:  01/17/2015 FINDINGS: Cardiomegaly. Vascular congestion and interstitial prominence, likely early interstitial edema. No confluent opacities or effusions. No acute bony abnormality. IMPRESSION: Cardiomegaly with vascular congestion and likely early interstitial edema. Electronically Signed   By: Rolm Baptise M.D.   On: 10/25/2017 13:42   Dg Foot Complete Left  Result Date: 10/25/2017 CLINICAL DATA:  Left foot pain and swelling, no known injury, initial encounter EXAM: LEFT FOOT - COMPLETE 3+ VIEW COMPARISON:  None. FINDINGS: Hallux valgus deformity is noted. Densely calcified first toenail is seen. No acute fracture or dislocation is noted. Tarsal degenerative changes are seen. Mild soft tissue swelling is noted about the metatarsals. Vascular calcifications are seen. IMPRESSION: Chronic changes without acute abnormality. Electronically Signed   By: Inez Catalina M.D.   On: 10/25/2017 16:16   Dg Foot Complete Right  Result Date: 10/25/2017 CLINICAL DATA:  Right foot pain at base of 5th metatarsal. EXAM: RIGHT FOOT COMPLETE - 3+ VIEW COMPARISON:  None. FINDINGS: No fracture, subluxation or dislocation. Degenerative changes and hallux valgus at the 1st MTP joint. Bony erosions noted at the tarsal metatarsal joints. Vascular calcifications noted. Plantar calcaneal spur. IMPRESSION: No acute bony abnormality. Bony erosions at the tarsal metatarsal joints. Hallux valgus deformity. Electronically Signed   By: Rolm Baptise M.D.   On: 10/25/2017 13:56    Procedures Procedures (including critical care time)  Medications Ordered in ED Medications  nitroGLYCERIN (NITROGLYN) 2 % ointment 1 inch (not administered)  furosemide (LASIX) 120 mg in dextrose  5 % 50 mL IVPB (not administered)     Initial Impression / Assessment and Plan / ED Course  I have reviewed the triage vital signs and the nursing notes.  Pertinent labs & imaging results that were available during my care of the patient were reviewed by me and considered in my medical decision making (see chart for details).  Clinical Course as of Oct 25 1644  Sat Oct 25, 2017  1431 FINDINGS: Cardiomegaly. Vascular congestion and interstitial prominence, likely early interstitial edema. No confluent opacities or effusions. No acute bony abnormality.  IMPRESSION: Cardiomegaly with vascular congestion and likely early interstitial edema. DG Chest 2 View [CG]  1629 Potassium: (!) 5.3 [CG]  1629 Creatinine: (!) 3.90 [CG]  1629 GFR, Est African American: (!) 15 [CG]  1629 B Natriuretic Peptide: (!) 1,670.6 [CG]  1629 D-Dimer, Quant: (!) 0.89 [CG]    Clinical Course User Index [CG] Kinnie Feil, PA-C   A 84 year old male here for bilateral feet swelling and pain worse on the right with shortness of breath/chest tightness in setting of history of heart failure with preserved ejection fracture, advanced kidney disease. Per chart review patient has declined hemodialysis. Exam as above shows unilateral right lower extremity swelling. Lungs are clear. He is hemodynamically stable.   Differential includes DVT, fluid overload due to heart failure/kidney insufficiency. Lower in the differential is ACS, PE as he has no exertional or pleuritic chest pain or shortness of breath. No trauma.   Final Clinical Impressions(s) / ED Diagnoses   R LE venous US negative for DVT. X-ray of bilateral feet negative. BNP 1670 (previous in 570), hyperkalemia K 5.3, creatinine 3.9 (baseline ~3.5-3.7), GFR 15, hgb 9.3 at baseline, no leukocytosis. Age adjusted D-dimer minimally elevated at 0.89. CXR shows vascular congestion and interstitial edema consistent with acute exacerbation of HF. Will give lasix and  nitro paste. Consider VQ scan as inpatient. UA pending.  Final diagnoses:  None    ED Discharge Orders    None       Arlean Hopping 10/25/17 1647    Orlie Dakin, MD 10/26/17 0001

## 2017-10-25 NOTE — ED Notes (Signed)
For any information needed please call pt daughter, [name] [phone]

## 2017-10-25 NOTE — ED Notes (Signed)
Pt ambulated short distance with pulse ox. SPO2 remained 96%.

## 2017-10-25 NOTE — ED Notes (Signed)
Patient transported to X-ray 

## 2017-10-25 NOTE — ED Triage Notes (Signed)
Pt in c/o pain and intermittent swelling to his left foot, states the pain is to the bottom of his foot, no injury noted, pulses present

## 2017-10-25 NOTE — ED Notes (Signed)
Report attempt x 1 

## 2017-10-25 NOTE — ED Notes (Signed)
Report called. No bed currently in the room.  Floor RN will call when bed is ready.

## 2017-10-25 NOTE — ED Notes (Signed)
Vascular tech is aware of patient 

## 2017-10-25 NOTE — Progress Notes (Signed)
Right lower extremity venous duplex has been completed. Negative for DVT. Results were given to Carmon Sails PA.  10/25/17 1:41 PM Carlos Levering RVT

## 2017-10-25 NOTE — ED Provider Notes (Signed)
She has vague historian.  comlains of bilateral foot pain for approximately 2 weeks.  Left greater than right.  Also complains of worsening shortness of breath for the past 2 weeks,.  He denies any chest pain denies fever.  Denies injury.  On exam chronically ill-appearing heart regular rate and rhythm lungs Rales at bases bilaterally.  Right lower extremity with 3+ edema below the knee.  Left lower extremity with trace edema below the knee.  DP and PT pulses 2+ bilaterally.   Orlie Dakin, MD 10/25/17 1459

## 2017-10-25 NOTE — H&P (Addendum)
History and Physical    KWAKU MOSTAFA JSR:[id number] DOB: 1929/01/30 DOA: 10/25/2017  PCP: System, Pcp Not In.  Sees all physicians including PCP, Cardiology and Nephrology at [facility].  I have briefly reviewed patients previous medical reports in [facility].  Patient coming from: Home.  Chief Complaint: Right foot pain and worsening dyspnea.  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  Patient and daughter provided history.  He reports 3-4 weeks history of pain mostly on the sole of his left foot from a callus and has been advised to see podiatry for same but has not done that yet.  He has chronic bilateral lower extremity edema.  On the night prior to this admission, he noted worsening pain and swelling of his right dorsal lateral foot.  Pain was described as moderate to severe, kept him from sleeping at night, nonradiating, no aggravating or relieving factors.  Denies history of gout.  Denies history of trauma to foot or fall.  No prior such episodes.  He was seen at the heart failure clinic on 10/22/17 and was told to be stable and advised to return in 1 year.  Same day he and his daughter had food at [employer] and volunteers to eating salty food.  For the last 3-4 days, patient has noted intermittent but progressively worsening dyspnea.  Last night patient stated that apart from pain in his right foot, due to his dyspnea he was unable to sleep and had to sit up in bed.  No chest pain, cough, palpitations, dizziness, fever, chills, headache, earache, sore throat, runny nose or nasal stuffiness.  Due to these symptoms, patient decided to come  to the ED.  ED Course: Elevated blood pressures.  He has taken his a.m. medications but not some of his afternoon medications.  Claims compliance to medications.  Makes urine.  Daughter reports that his weight is usually around 223 pounds (however noted to be 255 pounds here).  Lab work significant for pancytopenia, potassium 5.3, creatinine 3.9, UA negative, chest x-ray suggestive of pulmonary edema, x-rays of both feet without acute findings, right lower extremity venous Doppler negative for DVT, d-dimer positive and VQ scan pending.  He has received Lasix 80 mg IV x1 and diuresing via condom catheter.  Review of Systems:  All other systems reviewed and apart from HPI, are negative.  Past Medical History:  Diagnosis Date  . CHF (congestive heart failure) ([place])   . Diabetes mellitus without complication ([place])   . Hyperlipidemia   . Hypertension   . Stroke [facility])    2009    Past Surgical History:  Procedure Laterality Date  . NECK SURGERY     lump removed  . REPLACEMENT TOTAL KNEE     Left and Right    Social History  reports that he has quit smoking. he has never used smokeless tobacco. He reports that he does not drink alcohol or use drugs.  Allergies  Allergen Reactions  . Flu Virus Vaccine Anaphylaxis    FLU VACCINATION  . Fosinopril Sodium Other (See Comments)    Unknown   . Hydrocodone-Acetaminophen     Other reaction(s): GI  Upset (intolerance)  . Meloxicam     REACTION: stomah pains, nausea, constipation  . Valsartan Other (See Comments)    Other reaction(s): Other (See Comments) DIOVAN, UNKNOWN - takes losartan at home unknown    Family History  Problem Relation Age of Onset  . Diabetes Brother      Prior to Admission medications   Medication Sig Start Date End Date Taking? Authorizing Provider  acetaminophen (TYLENOL) 500 MG tablet Take 1,000 mg by mouth every 6 (six) hours as needed.   Yes [provider]  amLODipine (NORVASC) 10 MG tablet Take  10 mg by mouth at bedtime.    Yes [provider]  aspirin EC 81 MG tablet Take 81 mg by mouth daily.   Yes [provider]  augmented betamethasone dipropionate (DIPROLENE-AF) 0.05 % ointment  09/23/17  Yes [provider]  cloNIDine (CATAPRES) 0.1 MG tablet Take 0.1 mg by mouth See admin instructions. Takes three times a day Take at 0900 1300 and at 2200 10/13/17  Yes [provider]  cloNIDine (CATAPRES) 0.2 MG tablet Take 1 tablet (0.2 mg total) by mouth 3 (three) times daily. 09/24/14  Yes TatShanon Brow, MD  furosemide (LASIX) 40 MG tablet Take 40-80 mg by mouth 2 (two) times daily. Take 80 mg every morning and take 40 mg tablet at 1400   Yes [provider]  gabapentin (NEURONTIN) 100 MG capsule Take 100-200 mg by mouth See admin instructions. Takes three times a day Take 200 mg at 0600 and 100 at 2200 09/23/17  Yes [provider]  hydrALAZINE (APRESOLINE) 100 MG tablet Take 100 mg by mouth 3 (three) times daily. Must take takes at 0900, 1300, and 2200 to prevent drop in the blood pressure   Yes [provider]  isosorbide mononitrate (IMDUR) 30 MG 24 hr tablet Take 1 tablet (30 mg total) by mouth daily. Patient taking differently: Take 60 mg by mouth every evening. Takes at 1800 with the 0.1 mg Clonidine 09/24/14  Yes Tat, David, MD  labetalol (NORMODYNE) 200 MG tablet Take 400 mg by mouth 3 (three) times daily.   Yes [provider]  latanoprost (XALATAN) 0.005 % ophthalmic solution Place 1 drop into the left eye at bedtime.   Yes [provider]  levalbuterol (XOPENEX HFA) 45 MCG/ACT inhaler Inhale 2 puffs into the lungs every 4 (four) hours as needed for wheezing.   Yes [provider]  montelukast (SINGULAIR) 10 MG tablet Take 10 mg by mouth daily. 10/09/17  Yes [provider]  Polyethyl Glycol-Propyl Glycol 0.4-0.3 % SOLN Place 1 drop into the right eye at bedtime.   Yes [provider]    pravastatin (PRAVACHOL) 40 MG tablet Take 40 mg by mouth daily.   Yes [provider]  primidone (MYSOLINE) 50 MG tablet Take 50-250 mg by mouth See admin instructions. Take three times a day 50 mg at 9:00 and 14:00 and 250 mg at 2200   Yes [provider]  tamsulosin (FLOMAX) 0.4 MG CAPS capsule Take 0.4 mg by mouth daily.   Yes [provider]  traMADol (ULTRAM) 50 MG tablet Take 50 mg by mouth every 6 (six) hours as needed for moderate pain.   Yes [provider]    Physical Exam: Vitals:   10/25/17 1430 10/25/17 1444 10/25/17 1545 10/25/17 1615  BP: (!) 157/73  (!) 166/68 (!) 178/68  Pulse: 66 (!) 57 (!) 52 (!) 50  Resp: 17 16 13  20  Temp:      TempSrc:      SpO2: 96% 98% 97% 97%      Constitutional: Pleasant elderly male, moderately built and nourished, lying comfortably propped up in bed.  Does not appear in any distress. Eyes: PERTLA, lids and conjunctivae normal.  Bilateral arcus senilis. ENMT: Mucous membranes are moist. Posterior pharynx clear of any exudate or lesions. Normal dentition.  Neck: supple, no masses, no thyromegaly Respiratory: Diminished breath sounds in the bases with few bibasilar crackles.  Rest of lung fields clear to auscultation.  No increased work of breathing. Cardiovascular: S1 & S2 heard, regular rate and rhythm, no murmurs / rubs / gallops. No carotid bruits.  Bilateral leg edema, right (2+) >left (1+).  Diminished dorsalis pedis and posterior tibial pulses bilaterally but the popliteal pulses well felt. Abdomen: No distension, no tenderness, no masses palpated. No hepatosplenomegaly. Bowel sounds normal.  Musculoskeletal: no clubbing / cyanosis. No joint deformity upper and lower extremities. Good ROM, no contractures. Normal muscle tone.  Tenderness to palpation dorsi lateral mid right foot with no increase in temperature, no redness, no fluctuation or crepitus or open lesions noted.  Patient has a callus in the  plantar aspect of left lateral forefoot without acute findings. Skin: no rashes, lesions, ulcers. No induration Neurologic: CN 2-12 grossly intact. Sensation intact, DTR normal.  Strength grade 5 x 5 power in right limbs, grade 4 x 5 power in left limbs. Psychiatric: Normal judgment and insight. Alert and oriented x 3. Normal mood.     Labs on Admission: I have personally reviewed following labs and imaging studies  CBC: Recent Labs  Lab 10/25/17 1530  WBC 2.8*  NEUTROABS 1.6*  HGB 9.3*  HCT 28.5*  MCV 92.5  PLT 951*   Basic Metabolic Panel: Recent Labs  Lab 10/25/17 1419  NA 134*  K 5.3*  CL 102  CO2 20*  GLUCOSE 115*  BUN 88*  CREATININE 3.90*  CALCIUM 9.1   Urine analysis:    Component Value Date/Time   COLORURINE STRAW (A) 10/25/2017 [place] 10/25/2017 1632   LABSPEC 1.008 10/25/2017 1632   PHURINE 6.0 10/25/2017 1632   GLUCOSEU NEGATIVE 10/25/2017 1632   HGBUR NEGATIVE 10/25/2017 1632   BILIRUBINUR NEGATIVE 10/25/2017 1632   KETONESUR NEGATIVE 10/25/2017 1632   PROTEINUR 30 (A) 10/25/2017 1632   UROBILINOGEN 1.0 09/21/2014 0035   NITRITE NEGATIVE 10/25/2017 1632   LEUKOCYTESUR NEGATIVE 10/25/2017 1632     Radiological Exams on Admission: Dg Chest 2 View  Result Date: 10/25/2017 CLINICAL DATA:  Shortness of Breath EXAM: CHEST - 2 VIEW COMPARISON:  01/17/2015 FINDINGS: Cardiomegaly. Vascular congestion and interstitial prominence, likely early interstitial edema. No confluent opacities or effusions. No acute bony abnormality. IMPRESSION: Cardiomegaly with vascular congestion and likely early interstitial edema. Electronically Signed   By: Rolm Baptise M.D.   On: 10/25/2017 13:42   Dg Foot Complete Left  Result Date: 10/25/2017 CLINICAL DATA:  Left foot pain and swelling, no known injury, initial encounter EXAM: LEFT FOOT - COMPLETE 3+ VIEW COMPARISON:  None. FINDINGS: Hallux valgus deformity is noted. Densely calcified first toenail is  seen. No acute fracture or dislocation is noted. Tarsal degenerative changes are seen. Mild soft tissue swelling is noted about the metatarsals. Vascular calcifications are seen. IMPRESSION: Chronic changes without acute abnormality. Electronically Signed   By: Inez Catalina M.D.   On: 10/25/2017 16:16   Dg Foot Complete Right  Result Date: 10/25/2017 CLINICAL DATA:  Right foot pain at base of 5th metatarsal. EXAM: RIGHT FOOT COMPLETE - 3+ VIEW COMPARISON:  None. FINDINGS: No fracture, subluxation or dislocation. Degenerative changes and hallux valgus at the 1st MTP joint. Bony erosions noted at the tarsal metatarsal joints. Vascular calcifications noted. Plantar calcaneal spur. IMPRESSION: No acute bony abnormality. Bony erosions at the tarsal metatarsal joints. Hallux valgus deformity. Electronically Signed   By: Rolm Baptise M.D.   On: 10/25/2017 13:56    EKG: Independently reviewed.  Sinus rhythm at 61 bpm, normal axis, LVH with secondary repolarization abnormalities and no acute findings.  Assessment/Plan Principal Problem:   Acute on chronic diastolic CHF (congestive heart failure) (HCC) Active Problems:   OBSTRUCTIVE SLEEP APNEA   Essential hypertension   PVD   History of cardiovascular disorder   CKD (chronic kidney disease), stage IV (HCC)   DM (diabetes mellitus), type 2 with renal complications (HCC)   Dyslipidemia     Acute on chronic diastolic CHF: Likely precipitated by advanced chronic kidney disease and dietary indiscretion.  Daughter reports steady weight at 223 pounds but initial weight in the ED 255 pounds.  Received IV Lasix 80 mg x1 in ED and is diuresing well.  Continue Lasix IV 80 mg every 12 hours, strict intake output and daily weights.  Consult regarding low-salt diet.  Seen by advanced heart failure MD at [facility] on 3/6, felt to be stable and advised to return in 1 year.  TTE 11/17/14: LVEF 60-65% and mild aortic stenosis.  Cardiologist did not feel need to repeat his  echocardiogram.  Elevated d-dimer noted, may be due to renal insufficiency but PE not ruled out, follow VQ scan.  Stage V chronic kidney disease: Baseline creatinine said to be between 3.7-3.9 and likely at baseline.  Monitor closely while on IV Lasix.  Patient not interested in HD and is very clear about it.  Mild hyperkalemia: IV Lasix should help.  Renal diet.  Follow BMP in a.m.  Monitor on telemetry.  Right foot pain: Seems bony in etiology.  X-rays without acute findings.  Right lower extremity venous Doppler negative for DVT.  Topical Voltaren gel.  PT evaluation.  Resistant hypertension: Verified patient's polypharmacy with his daughter.  Continue prior home dose of clonidine, hydralazine, labetalol, amlodipine and Imdur.  Type II DM with renal complications: Not on oral hypoglycemics PTA.  SSI.  Pancytopenia: Reported MDS.  Follow CBC in a.m.  CVA with residual left hemiparesis: Continue aspirin.  PT evaluation.  OSA: Unable to tolerate CPAP.  Hyperlipidemia  Left foot callus: Outpatient podiatry consultation.   DVT prophylaxis: Subcutaneous heparin Code Status: DNR.  This was confirmed with the patient in the presence of his daughter. Family Communication: Discussed in detail with patient's daughter at bedside.  Updated care and answered questions. Disposition Plan: DC home pending clinical improvement, possibly 10/26/17. Consults called: None Admission status: Telemetry, observation.  Severity of Illness: The appropriate patient status for this patient is OBSERVATION. Observation status is judged to be reasonable and necessary in order to provide the required intensity of service to ensure the patient's safety. The patient's presenting symptoms, physical exam findings, and initial radiographic and laboratory data in the context of their medical condition is felt to place them at decreased risk for further clinical deterioration. Furthermore, it is anticipated that the patient  will be medically stable for discharge from the hospital within 2 midnights of admission. The following factors support the patient status of observation.   " The patient's presenting symptoms  include right foot pain and worsening dyspnea. " The physical exam findings include bilateral lower extremity edema, right greater than sign left, tenderness of right foot, basal crackles, elevated blood pressures. " The initial radiographic and laboratory data are chest x-ray suggestive of pulmonary edema, creatinine 3.9, potassium 5.3, pancytopenia.      Vernell Leep MD [facility] Pager [phone]  If 7PM-7AM, please contact night-coverage [email or web address] Password [facility]  10/25/2017, 6:16 PM

## 2017-10-26 ENCOUNTER — Other Ambulatory Visit: Payer: Self-pay

## 2017-10-26 ENCOUNTER — Encounter (HOSPITAL_COMMUNITY): Payer: Self-pay | Admitting: Internal Medicine

## 2017-10-26 ENCOUNTER — Observation Stay (HOSPITAL_COMMUNITY): Payer: Medicare Other

## 2017-10-26 DIAGNOSIS — Z87891 Personal history of nicotine dependence: Secondary | ICD-10-CM | POA: Diagnosis not present

## 2017-10-26 DIAGNOSIS — E1122 Type 2 diabetes mellitus with diabetic chronic kidney disease: Secondary | ICD-10-CM | POA: Diagnosis present

## 2017-10-26 DIAGNOSIS — E875 Hyperkalemia: Secondary | ICD-10-CM | POA: Diagnosis present

## 2017-10-26 DIAGNOSIS — I5033 Acute on chronic diastolic (congestive) heart failure: Secondary | ICD-10-CM | POA: Diagnosis present

## 2017-10-26 DIAGNOSIS — M79671 Pain in right foot: Secondary | ICD-10-CM

## 2017-10-26 DIAGNOSIS — Z885 Allergy status to narcotic agent status: Secondary | ICD-10-CM | POA: Diagnosis not present

## 2017-10-26 DIAGNOSIS — Z87892 Personal history of anaphylaxis: Secondary | ICD-10-CM | POA: Diagnosis not present

## 2017-10-26 DIAGNOSIS — Z887 Allergy status to serum and vaccine status: Secondary | ICD-10-CM | POA: Diagnosis not present

## 2017-10-26 DIAGNOSIS — Z66 Do not resuscitate: Secondary | ICD-10-CM | POA: Diagnosis present

## 2017-10-26 DIAGNOSIS — Z888 Allergy status to other drugs, medicaments and biological substances status: Secondary | ICD-10-CM | POA: Diagnosis not present

## 2017-10-26 DIAGNOSIS — E785 Hyperlipidemia, unspecified: Secondary | ICD-10-CM | POA: Diagnosis present

## 2017-10-26 DIAGNOSIS — I132 Hypertensive heart and chronic kidney disease with heart failure and with stage 5 chronic kidney disease, or end stage renal disease: Secondary | ICD-10-CM | POA: Diagnosis present

## 2017-10-26 DIAGNOSIS — D61818 Other pancytopenia: Secondary | ICD-10-CM | POA: Diagnosis present

## 2017-10-26 DIAGNOSIS — L84 Corns and callosities: Secondary | ICD-10-CM | POA: Diagnosis present

## 2017-10-26 DIAGNOSIS — Z833 Family history of diabetes mellitus: Secondary | ICD-10-CM | POA: Diagnosis not present

## 2017-10-26 DIAGNOSIS — I509 Heart failure, unspecified: Secondary | ICD-10-CM | POA: Diagnosis present

## 2017-10-26 DIAGNOSIS — M10371 Gout due to renal impairment, right ankle and foot: Secondary | ICD-10-CM | POA: Diagnosis not present

## 2017-10-26 DIAGNOSIS — I69354 Hemiplegia and hemiparesis following cerebral infarction affecting left non-dominant side: Secondary | ICD-10-CM | POA: Diagnosis not present

## 2017-10-26 DIAGNOSIS — I10 Essential (primary) hypertension: Secondary | ICD-10-CM | POA: Diagnosis not present

## 2017-10-26 DIAGNOSIS — I350 Nonrheumatic aortic (valve) stenosis: Secondary | ICD-10-CM | POA: Diagnosis present

## 2017-10-26 DIAGNOSIS — G4733 Obstructive sleep apnea (adult) (pediatric): Secondary | ICD-10-CM | POA: Diagnosis present

## 2017-10-26 DIAGNOSIS — E1151 Type 2 diabetes mellitus with diabetic peripheral angiopathy without gangrene: Secondary | ICD-10-CM | POA: Diagnosis present

## 2017-10-26 DIAGNOSIS — Z7984 Long term (current) use of oral hypoglycemic drugs: Secondary | ICD-10-CM | POA: Diagnosis not present

## 2017-10-26 DIAGNOSIS — N185 Chronic kidney disease, stage 5: Secondary | ICD-10-CM | POA: Diagnosis present

## 2017-10-26 DIAGNOSIS — M109 Gout, unspecified: Secondary | ICD-10-CM | POA: Diagnosis present

## 2017-10-26 DIAGNOSIS — Z96653 Presence of artificial knee joint, bilateral: Secondary | ICD-10-CM | POA: Diagnosis present

## 2017-10-26 LAB — GLUCOSE, CAPILLARY
GLUCOSE-CAPILLARY: 132 mg/dL — AB (ref 65–99)
Glucose-Capillary: 135 mg/dL — ABNORMAL HIGH (ref 65–99)
Glucose-Capillary: 129 mg/dL — ABNORMAL HIGH (ref 65–99)
Glucose-Capillary: 159 mg/dL — ABNORMAL HIGH (ref 65–99)

## 2017-10-26 LAB — BASIC METABOLIC PANEL
Anion gap: 11 (ref 5–15)
BUN: 87 mg/dL — ABNORMAL HIGH (ref 6–20)
CHLORIDE: 103 mmol/L (ref 101–111)
CO2: 23 mmol/L (ref 22–32)
Calcium: 9 mg/dL (ref 8.9–10.3)
Creatinine, Ser: 3.78 mg/dL — ABNORMAL HIGH (ref 0.61–1.24)
GFR calc non Af Amer: 13 mL/min — ABNORMAL LOW (ref >60)
GFR, EST AFRICAN AMERICAN: 15 mL/min — AB (ref >60)
Glucose, Bld: 100 mg/dL — ABNORMAL HIGH (ref 65–99)
POTASSIUM: 4.1 mmol/L (ref 3.5–5.1)
Sodium: 137 mmol/L (ref 135–145)

## 2017-10-26 LAB — CBC
HEMATOCRIT: 27.4 % — AB (ref 39.0–52.0)
Hemoglobin: 9.1 g/dL — ABNORMAL LOW (ref 13.0–17.0)
MCH: 30.6 pg (ref 26.0–34.0)
MCHC: 33.2 g/dL (ref 30.0–36.0)
MCV: 92.3 fL (ref 78.0–100.0)
Platelets: 122 10*3/uL — ABNORMAL LOW (ref 150–400)
RBC: 2.97 MIL/uL — AB (ref 4.22–5.81)
RDW: 13.9 % (ref 11.5–15.5)
WBC: 3.5 10*3/uL — AB (ref 4.0–10.5)

## 2017-10-26 MED ORDER — FUROSEMIDE 10 MG/ML IJ SOLN
60.0000 mg | Freq: Two times a day (BID) | INTRAMUSCULAR | Status: DC
  Administered 2017-10-26 – 2017-10-28 (×6): 60 mg via INTRAVENOUS
  Filled 2017-10-26 (×6): qty 6

## 2017-10-26 MED ORDER — TECHNETIUM TC 99M DIETHYLENETRIAME-PENTAACETIC ACID
31.7000 | Freq: Once | INTRAVENOUS | Status: AC | PRN
  Administered 2017-10-26: 31.7 via RESPIRATORY_TRACT

## 2017-10-26 MED ORDER — TECHNETIUM TO 99M ALBUMIN AGGREGATED
4.3000 | Freq: Once | INTRAVENOUS | Status: AC | PRN
  Administered 2017-10-26: 4.3 via INTRAVENOUS

## 2017-10-26 NOTE — Progress Notes (Signed)
PROGRESS NOTE   [name] [name]  YBO:[id number]    DOB: 1928-11-09    DOA: 10/25/2017  PCP: System, Pcp Not In   I have briefly reviewed patients previous medical records in [facility]  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  Admitted for acute on chronic diastolic CHF in context of stage V chronic kidney disease (not dialysis candidate), mild hyperkalemia and right foot pain.  Improving.  Assessment & Plan:   Principal Problem:   Acute on chronic diastolic CHF (congestive heart failure) (HCC) Active Problems:   OBSTRUCTIVE SLEEP APNEA   Essential hypertension   PVD   History of cardiovascular disorder   DM (diabetes mellitus), type 2 with renal complications (HCC)   Dyslipidemia   CKD (chronic kidney disease), stage V (HCC)   Acute on chronic diastolic CHF: Likely precipitated by advanced chronic kidney disease and dietary indiscretion.  Daughter reports steady weight at 223 pounds but initial weight in the ED 255 pounds-this may have been inaccurate.  Received IV Lasix 80 mg x1 in ED and diuresed well.  -2.3 L since admission.  Weight down from 217 to 212 pounds since admission.  Strict intake output and daily weights.  Counseled regarding low-salt diet.  Seen by advanced heart failure MD at [facility] on 3/6, felt to be stable and advised to return in 1 year.  TTE 11/17/14: LVEF 60-65% and mild aortic stenosis.  Cardiologist did not feel need to repeat his echocardiogram.  Elevated d-dimer noted, may be due to renal insufficiency but PE not ruled out, follow VQ scan-pending and to be done this morning.  Improving but still volume  overloaded.  Changed Lasix to 60 mg IV twice daily.  Stage V chronic kidney disease: Baseline creatinine said to be between 3.7-3.9 and likely at baseline.  Monitor closely while on IV Lasix.  Patient not interested in HD and is very clear about it. Creatinine stable.  Mild hyperkalemia: Renal diet. Resolved.  Right foot pain: Seems bony in etiology.  X-rays without acute findings.  Right lower extremity venous Doppler negative for DVT.  Topical Voltaren gel.  PT evaluation.  Edema may be contributing.  Improving.  Resistant hypertension: Verified patient's polypharmacy with his daughter on admission.  Continue prior home dose of clonidine, hydralazine, labetalol, amlodipine and Imdur. Mildly uncontrolled. No change in regimen.  Type II DM with renal complications: Not on oral hypoglycemics PTA.  SSI.  Reasonable inpatient control.  Pancytopenia: Reported MDS.  Stable.  CVA with residual left hemiparesis: Continue aspirin.  PT evaluation pending.  OSA: Unable to tolerate CPAP.  Hyperlipidemia  Left foot callus: Outpatient podiatry consultation.     DVT prophylaxis: Heparin Code Status: DNR Family Communication: None at bedside Disposition: DC home pending clinical improvement, possibly 10/27/17   Consultants:  None  Procedures:  None  Antimicrobials:  None   Subjective: Feels better.  Dyspnea improved.  Right leg pain and swelling improved.  Urinated well after IV Lasix in ED.  As per RN, no acute issues reported.  No chest pain  ROS: As above.  Objective:  Vitals:   10/26/17 0022 10/26/17 0550 10/26/17 0700 10/26/17 0851  BP: Marland Kitchen)  157/71 (!) 162/67  (!) 172/71  Pulse: 61 69  67  Resp:  18  18  Temp:  98 F (36.7 C)  98.5 F (36.9 C)  TempSrc:  Oral  Oral  SpO2:  98%  100%  Weight:  96.8 kg (213 lb 6.5 oz) 96.5 kg (212 lb 11.2 oz)   Height:        Examination:  General exam: Pleasant elderly male, moderately built and nourished, sitting up and  eating breakfast this morning. Respiratory system: Diminished breath sounds in the bases with few bibasilar crackles.  Rest of lung fields clear to auscultation. Respiratory effort normal. Cardiovascular system: S1 & S2 heard, RRR. No JVD, murmurs, rubs, gallops or clicks.  1+ pitting leg edema, right > left.  Telemetry personally reviewed: SB in the 50s-SR with first-degree AV block. Gastrointestinal system: Abdomen is nondistended, soft and nontender. No organomegaly or masses felt. Normal bowel sounds heard. Central nervous system: Alert and oriented. No focal neurological deficits. Extremities: Right limbs grade 5 x 5 power in left limbs grade 4 x 5 power.  Mild tenderness dorsal lateral aspect of right midfoot.  Has callus over left lateral mid plantar aspect on left foot Skin: No rashes, lesions or ulcers Psychiatry: Judgement and insight appear normal. Mood & affect appropriate.     Data Reviewed: I have personally reviewed following labs and imaging studies  CBC: Recent Labs  Lab 10/25/17 1530 10/26/17 0146  WBC 2.8* 3.5*  NEUTROABS 1.6*  --   HGB 9.3* 9.1*  HCT 28.5* 27.4*  MCV 92.5 92.3  PLT 124* 569*   Basic Metabolic Panel: Recent Labs  Lab 10/25/17 1419 10/26/17 0146  NA 134* 137  K 5.3* 4.1  CL 102 103  CO2 20* 23  GLUCOSE 115* 100*  BUN 88* 87*  CREATININE 3.90* 3.78*  CALCIUM 9.1 9.0   CBG: Recent Labs  Lab 10/25/17 1848 10/25/17 2118 10/26/17 0720  GLUCAP 125* 195* 132*    No results found for this or any previous visit (from the past 240 hour(s)).       Radiology Studies: Dg Chest 2 View  Result Date: 10/25/2017 CLINICAL DATA:  Shortness of Breath EXAM: CHEST - 2 VIEW COMPARISON:  01/17/2015 FINDINGS: Cardiomegaly. Vascular congestion and interstitial prominence, likely early interstitial edema. No confluent opacities or effusions. No acute bony abnormality. IMPRESSION: Cardiomegaly with vascular congestion and likely early interstitial edema.  Electronically Signed   By: Rolm Baptise M.D.   On: 10/25/2017 13:42   Dg Foot Complete Left  Result Date: 10/25/2017 CLINICAL DATA:  Left foot pain and swelling, no known injury, initial encounter EXAM: LEFT FOOT - COMPLETE 3+ VIEW COMPARISON:  None. FINDINGS: Hallux valgus deformity is noted. Densely calcified first toenail is seen. No acute fracture or dislocation is noted. Tarsal degenerative changes are seen. Mild soft tissue swelling is noted about the metatarsals. Vascular calcifications are seen. IMPRESSION: Chronic changes without acute abnormality. Electronically Signed   By: Inez Catalina M.D.   On: 10/25/2017 16:16   Dg Foot Complete Right  Result Date: 10/25/2017 CLINICAL DATA:  Right foot pain at base of 5th metatarsal. EXAM: RIGHT FOOT COMPLETE - 3+ VIEW COMPARISON:  None. FINDINGS: No fracture, subluxation or dislocation. Degenerative changes and hallux valgus at the 1st MTP joint. Bony erosions noted at the tarsal metatarsal joints. Vascular calcifications noted. Plantar calcaneal spur. IMPRESSION: No acute bony abnormality. Bony erosions at the tarsal metatarsal joints. Hallux valgus deformity. Electronically Signed   By:  Rolm Baptise M.D.   On: 10/25/2017 13:56        Scheduled Meds: . amLODipine  10 mg Oral QHS  . aspirin EC  81 mg Oral Daily  . cloNIDine  0.3 mg Oral TID  . diclofenac sodium  4 g Topical QID  . furosemide  60 mg Intravenous BID  . gabapentin  100 mg Oral QHS  . gabapentin  200 mg Oral Q0600  . heparin  5,000 Units Subcutaneous Q8H  . hydrALAZINE  100 mg Oral TID  . insulin aspart  0-9 Units Subcutaneous TID WC  . isosorbide mononitrate  60 mg Oral QPM  . labetalol  400 mg Oral TID  . latanoprost  1 drop Left Eye QHS  . polyvinyl alcohol  1 drop Right Eye QHS  . pravastatin  40 mg Oral Daily  . primidone  250 mg Oral QHS  . primidone  50 mg Oral 2 times per day  . tamsulosin  0.4 mg Oral Daily   Continuous Infusions:   LOS: 0 days     Vernell Leep, MD, FACP, [facility]. [facility] Pager [phone]  If 7PM-7AM, please contact night-coverage [email or web address] Password [email or web address] 10/26/2017, 11:01 AM

## 2017-10-26 NOTE — Evaluation (Signed)
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  Admitted for acute on chronic diastolic CHF in context of stage V chronic kidney disease (not dialysis candidate), mild hyperkalemia and right foot pain.   Clinical Impression   Pt admitted with above diagnosis. Pt currently with functional limitations due to the deficits listed below (see PT Problem List). Painful R foot is limiting activity tolerance, functional mobility, and pt currently needs at least Moderate assist for help with transfers; will have near, if not complete 24 hour assist from daughter at home (Obs status without a qualifying stay takes SNF for rehab out of the picture); Will recommend a wheelchair to be able to get around when he cannot tolerate standing and walking on R foot, HHPT/OT/Aide; Is he active with the [facility]? Pt will benefit from skilled PT to increase their independence and safety with mobility to allow discharge to the venue listed below.       Follow Up Recommendations Home health PT;Supervision/Assistance - 24 hour;Other (comment)(Rec HHOT and Aide as well)    Equipment Recommendations  Wheelchair (measurements PT)    Recommendations for Other Services OT consult(ordered per protocol)     Precautions / Restrictions Precautions Precautions: Fall Restrictions Weight Bearing Restrictions: No      Mobility  Bed Mobility Overal bed mobility: Needs Assistance Bed Mobility: Rolling;Sidelying to Sit Rolling: Min guard Sidelying to  sit: Mod assist       General bed mobility comments: Light mod handheld assist to elevate trunk to sit;  Cues to reciprocally scoot to EOB; noted a posterior bias sitting EOB, but no loss of balance  Transfers Overall transfer level: Needs assistance Equipment used: Rolling walker (2 wheeled) Transfers: Sit to/from Stand Sit to Stand: Mod assist         General transfer comment: Heavy mod assist to power up to stand  Ambulation/Gait Ambulation/Gait assistance: Min guard Ambulation Distance (Feet): (pivot steps bed to recliner) Assistive device: Rolling walker (2 wheeled) Gait Pattern/deviations: Shuffle     General Gait Details: Cues to support self on RW and to unweigh painful R foot as needed  Stairs            Wheelchair Mobility    Modified Rankin (Stroke Patients Only)       Balance                                             Pertinent Vitals/Pain Pain Assessment: 0-10 Pain Score: 10-Worst pain ever Pain Location: R foot, lateral aspect, with any weight bearing; tender to palpation Pain Descriptors / Indicators: Aching;Grimacing;Guarding Pain Intervention(s): Limited activity within patient's tolerance    Home Living Family/patient expects to be discharged to:: Private residence Living Arrangements: Children Available Help at Discharge: Family;Available 24 hours/day Type of Home: House Home Access: Stairs to enter;Ramped entrance(and stairs) Entrance Stairs-Rails: Right Entrance Stairs-Number of Steps: 5 Home Layout: One level Home Equipment: Walker - 2 wheels;Shower seat -  built in      Prior Function Level of Independence: Independent with assistive device(s)         Comments: Amb with RW     Hand Dominance        Extremity/Trunk Assessment   Upper Extremity Assessment Upper Extremity Assessment: Generalized weakness(LUE with residual weakness from CVA)    Lower Extremity Assessment Lower Extremity  Assessment: Generalized weakness;RLE deficits/detail RLE Deficits / Details: Painful R Foot, lateral aspect; very painful with weight bearing; noted swelling compared to L foot, tender to palpation       Communication   Communication: No difficulties  Cognition Arousal/Alertness: Awake/alert Behavior During Therapy: WFL for tasks assessed/performed Overall Cognitive Status: Within Functional Limits for tasks assessed                                        General Comments General comments (skin integrity, edema, etc.): Session conducted on Room Air, no overt dyspnea noted    Exercises     Assessment/Plan    PT Assessment Patient needs continued PT services  PT Problem List Decreased strength;Decreased range of motion;Decreased activity tolerance;Decreased balance;Decreased mobility;Decreased knowledge of use of DME;Decreased knowledge of precautions;Cardiopulmonary status limiting activity;Pain       PT Treatment Interventions DME instruction;Gait training;Stair training;Functional mobility training;Therapeutic activities;Therapeutic exercise;Patient/family education    PT Goals (Current goals can be found in the Care Plan section)  Acute Rehab PT Goals Patient Stated Goal: move better PT Goal Formulation: With patient Time For Goal Achievement: 11/09/17 Potential to Achieve Goals: Good    Frequency Min 3X/week   Barriers to discharge        Co-evaluation               AM-PAC PT "6 Clicks" Daily Activity  Outcome Measure Difficulty turning over in bed (including adjusting bedclothes, sheets and blankets)?: A Little Difficulty moving from lying on back to sitting on the side of the bed? : A Lot Difficulty sitting down on and standing up from a chair with arms (e.g., wheelchair, bedside commode, etc,.)?: Unable Help needed moving to and from a bed to chair (including a wheelchair)?: A Little Help needed walking in hospital room?: A Lot Help needed  climbing 3-5 steps with a railing? : Total 6 Click Score: 12    End of Session Equipment Utilized During Treatment: Gait belt Activity Tolerance: Patient tolerated treatment well Patient left: in chair;with call bell/phone within reach;with chair alarm set;with family/visitor present Nurse Communication: Mobility status PT Visit Diagnosis: Unsteadiness on feet (R26.81);Other abnormalities of gait and mobility (R26.89);Pain(Decr functional capacity) Pain - Right/Left: Right Pain - part of body: Ankle and joints of foot    Time: 1130-1209 PT Time Calculation (min) (ACUTE ONLY): 39 min   Charges:   PT Evaluation $PT Eval Low Complexity: 1 Low PT Treatments $Gait Training: 8-22 mins $Therapeutic Activity: 8-22 mins   PT G Codes:        Roney Marion, PT  Acute Rehabilitation Services Pager [phone] Office [phone]   Colletta [place] 10/26/2017, 12:43 PM

## 2017-10-26 NOTE — Progress Notes (Signed)
Physical Therapy Treatment Note   Patient suffers from Acute CHF exacerbation, R foot pain with weight bearing which impairs their ability to perform daily activities like transfers, ambulation, bathing, dressing, climbing stairs/ramp,etc in the home.  A walker alone will not resolve the issues with performing activities of daily living. A wheelchair will allow patient to safely perform daily activities.  The patient can self propel in the home or has a caregiver who can provide assistance.     Roney Marion, [place]  Acute Rehabilitation Services Pager [phone] Office [phone]

## 2017-10-26 NOTE — Plan of Care (Signed)
  Progressing Safety: Ability to remain free from injury will improve 10/26/2017 0802 - Progressing by Ardine Eng, RN

## 2017-10-27 DIAGNOSIS — M10371 Gout due to renal impairment, right ankle and foot: Secondary | ICD-10-CM

## 2017-10-27 LAB — BASIC METABOLIC PANEL
Anion gap: 12 (ref 5–15)
BUN: 82 mg/dL — AB (ref 6–20)
CALCIUM: 8.7 mg/dL — AB (ref 8.9–10.3)
CO2: 23 mmol/L (ref 22–32)
CREATININE: 3.83 mg/dL — AB (ref 0.61–1.24)
Chloride: 102 mmol/L (ref 101–111)
GFR calc non Af Amer: 13 mL/min — ABNORMAL LOW (ref >60)
GFR, EST AFRICAN AMERICAN: 15 mL/min — AB (ref >60)
Glucose, Bld: 129 mg/dL — ABNORMAL HIGH (ref 65–99)
Potassium: 3.8 mmol/L (ref 3.5–5.1)
SODIUM: 137 mmol/L (ref 135–145)

## 2017-10-27 LAB — GLUCOSE, CAPILLARY
GLUCOSE-CAPILLARY: 122 mg/dL — AB (ref 65–99)
GLUCOSE-CAPILLARY: 131 mg/dL — AB (ref 65–99)
Glucose-Capillary: 191 mg/dL — ABNORMAL HIGH (ref 65–99)
Glucose-Capillary: 251 mg/dL — ABNORMAL HIGH (ref 65–99)

## 2017-10-27 LAB — URIC ACID: Uric Acid, Serum: 10.6 mg/dL — ABNORMAL HIGH (ref 4.4–7.6)

## 2017-10-27 LAB — SEDIMENTATION RATE: Sed Rate: 16 mm/hr (ref 0–16)

## 2017-10-27 LAB — C-REACTIVE PROTEIN: CRP: 2.2 mg/dL — ABNORMAL HIGH (ref <1.0)

## 2017-10-27 MED ORDER — HYDRALAZINE HCL 20 MG/ML IJ SOLN: 10.0000 mg | Freq: Four times a day (QID) | INTRAMUSCULAR | Status: DC | PRN

## 2017-10-27 MED ORDER — PREDNISONE 20 MG PO TABS
40.0000 mg | ORAL_TABLET | Freq: Every day | ORAL | Status: DC
  Administered 2017-10-27 – 2017-10-28 (×2): 40 mg via ORAL
  Filled 2017-10-27 (×2): qty 2

## 2017-10-27 NOTE — Consult Note (Signed)
Reason for Consult:Foot pain Referring Physician: A Hongalgi  [name] [name] is an 84 y.o. male.  HPI: [name] presented to the hospital with a 2d hx/o foot pain. He notes the pain had started on Thursday then worsened and swelled on Friday. He came to the ED on Saturday at which point his left foot was hurting and swollen as well. He was admitted for a CHF exacerbation. X-rays were essentially negative. He states the swelling has improved quite a bit (which may be more related to his CHF treatment) and the pain has resolved in the left foot and lessened only slightly in his right. He still cannot bear weight on it 2/2 pain. He denies any prior hx/o similar or hx/o gout.  Past Medical History:  Diagnosis Date  . CHF (congestive heart failure) ([place])   . CKD (chronic kidney disease), stage IV ([place]) 10/25/2017  . Diabetes mellitus without complication ([place])   . Hyperlipidemia   . Hypertension   . Stroke [facility])    2009    Past Surgical History:  Procedure Laterality Date  . NECK SURGERY     lump removed  . REPLACEMENT TOTAL KNEE     Left and Right    Family History  Problem Relation Age of Onset  . Diabetes Brother     Social History:  reports that he has quit smoking. he has never used smokeless tobacco. He reports that he does not drink alcohol or use drugs.  Allergies:  Allergies  Allergen Reactions  . Flu Virus Vaccine Anaphylaxis    FLU VACCINATION  . Fosinopril Sodium Other (See Comments)    Unknown   . Hydrocodone-Acetaminophen     Other reaction(s): GI Upset (intolerance)  . Meloxicam     REACTION: stomah pains, nausea, constipation  . Valsartan Other (See Comments)    Other reaction(s): Other (See Comments) DIOVAN, UNKNOWN - takes losartan at home unknown    Medications: I have reviewed the patient's current medications.  Results for orders placed or performed during the hospital encounter of 10/25/17 (from the past 48 hour(s))  Basic metabolic panel      Status: Abnormal   Collection Time: 10/25/17  2:19 PM  Result Value Ref Range   Sodium 134 (L) 135 - 145 mmol/L   Potassium 5.3 (H) 3.5 - 5.1 mmol/L   Chloride 102 101 - 111 mmol/L   CO2 20 (L) 22 - 32 mmol/L   Glucose, Bld 115 (H) 65 - 99 mg/dL   BUN 88 (H) 6 - 20 mg/dL   Creatinine, Ser 3.90 (H) 0.61 - 1.24 mg/dL   Calcium 9.1 8.9 - 10.3 mg/dL   GFR calc non Af Amer 13 (L) >60 mL/min   GFR calc Af Amer 15 (L) >60 mL/min    Comment: (NOTE) The eGFR has been calculated using the CKD EPI equation. This calculation has not been validated in all clinical situations. eGFR's persistently <60 mL/min signify possible Chronic Kidney Disease.    Anion gap 12 5 - 15    Comment: Performed at [facility] [address], [place], [place] [address]  Brain natriuretic peptide     Status: Abnormal   Collection Time: 10/25/17  2:19 PM  Result Value Ref Range   B Natriuretic Peptide 1,670.6 (H) 0.0 - 100.0 pg/mL    Comment: Performed at [facility] [address], [place], [place] [address]  I-Stat Troponin, ED (not at [facility])     Status: None  Collection Time: 10/25/17  2:32 PM  Result Value Ref Range   Troponin i, poc 0.00 0.00 - 0.08 ng/mL   Comment 3            Comment: Due to the release kinetics of cTnI, a negative result within the first hours of the onset of symptoms does not rule out myocardial infarction with certainty. If myocardial infarction is still suspected, repeat the test at appropriate intervals.   D-dimer, quantitative (not at [facility])     Status: Abnormal   Collection Time: 10/25/17  3:30 PM  Result Value Ref Range   D-Dimer, Quant 0.89 (H) 0.00 - 0.50 ug/mL-FEU    Comment: (NOTE) At the manufacturer cut-off of 0.50 ug/mL FEU, this assay has been documented to exclude PE with a sensitivity and negative predictive value of 97 to 99%.  At this time, this assay has not been approved by the FDA to exclude DVT/VTE. Results should be correlated with clinical  presentation. Performed at [facility], [place] [address], [place], [place] [address]   Urinalysis, Routine w reflex microscopic     Status: Abnormal   Collection Time: 10/25/17  4:32 PM  Result Value Ref Range   Color, Urine STRAW (A) YELLOW   APPearance CLEAR CLEAR   Specific Gravity, Urine 1.008 1.005 - 1.030   pH 6.0 5.0 - 8.0   Glucose, UA NEGATIVE NEGATIVE mg/dL   Hgb urine dipstick NEGATIVE NEGATIVE   Bilirubin Urine NEGATIVE NEGATIVE   Ketones, ur NEGATIVE NEGATIVE mg/dL   Protein, ur 30 (A) NEGATIVE mg/dL   Nitrite NEGATIVE NEGATIVE   Leukocytes, UA NEGATIVE NEGATIVE   RBC / HPF 0-5 0 - 5 RBC/hpf   WBC, UA 0-5 0 - 5 WBC/hpf   Bacteria, UA NONE SEEN NONE SEEN   Squamous Epithelial / LPF 0-5 (A) NONE SEEN    Comment: Performed at [facility], [place] [address] [place], [place] [address]  Glucose, capillary     Status: Abnormal   Collection Time: 10/25/17  6:48 PM  Result Value Ref Range   Glucose-Capillary 125 (H) 65 - 99 mg/dL   Comment 1 Notify RN    Comment 2 Document in Chart   Glucose, capillary     Status: Abnormal   Collection Time: 10/25/17  9:18 PM  Result Value Ref Range   Glucose-Capillary 195 (H) 65 - 99 mg/dL  Basic metabolic panel     Status: Abnormal   Collection Time: 10/26/17  1:46 AM  Result Value Ref Range   Sodium 137 135 - 145 mmol/L   Potassium 4.1 3.5 - 5.1 mmol/L    Comment: DELTA CHECK NOTED   Chloride 103 101 - 111 mmol/L   CO2 23 22 - 32 mmol/L   Glucose, Bld 100 (H) 65 - 99 mg/dL   BUN 87 (H) 6 - 20 mg/dL   Creatinine, Ser 3.78 (H) 0.61 - 1.24 mg/dL   Calcium 9.0 8.9 - 10.3 mg/dL   GFR calc non Af Amer 13 (L) >60 mL/min   GFR calc Af Amer 15 (L) >60 mL/min    Comment: (NOTE) The eGFR has been calculated using the CKD EPI equation. This calculation has not been validated in all clinical situations. eGFR's persistently <60 mL/min signify possible Chronic Kidney Disease.    Anion gap 11 5 - 15    Comment: Performed at  [facility] [address], [place], [place] [address]  CBC     Status:  Abnormal   Collection Time: 10/26/17  1:46 AM  Result Value Ref Range   WBC 3.5 (L) 4.0 - 10.5 K/uL   RBC 2.97 (L) 4.22 - 5.81 MIL/uL   Hemoglobin 9.1 (L) 13.0 - 17.0 g/dL   HCT 27.4 (L) 39.0 - 52.0 %   MCV 92.3 78.0 - 100.0 fL   MCH 30.6 26.0 - 34.0 pg   MCHC 33.2 30.0 - 36.0 g/dL   RDW 13.9 11.5 - 15.5 %   Platelets 122 (L) 150 - 400 K/uL    Comment: Performed at [facility], [place] [address], [place], [place] [address]  Glucose, capillary     Status: Abnormal   Collection Time: 10/26/17  7:20 AM  Result Value Ref Range   Glucose-Capillary 132 (H) 65 - 99 mg/dL   Comment 1 Notify RN    Comment 2 Document in Chart   Glucose, capillary     Status: Abnormal   Collection Time: 10/26/17 12:00 PM  Result Value Ref Range   Glucose-Capillary 135 (H) 65 - 99 mg/dL   Comment 1 Notify RN    Comment 2 Document in Chart   Glucose, capillary     Status: Abnormal   Collection Time: 10/26/17  4:28 PM  Result Value Ref Range   Glucose-Capillary 129 (H) 65 - 99 mg/dL   Comment 1 Notify RN    Comment 2 Document in Chart   Glucose, capillary     Status: Abnormal   Collection Time: 10/26/17  9:02 PM  Result Value Ref Range   Glucose-Capillary 159 (H) 65 - 99 mg/dL  Basic metabolic panel     Status: Abnormal   Collection Time: 10/27/17  5:32 AM  Result Value Ref Range   Sodium 137 135 - 145 mmol/L   Potassium 3.8 3.5 - 5.1 mmol/L   Chloride 102 101 - 111 mmol/L   CO2 23 22 - 32 mmol/L   Glucose, Bld 129 (H) 65 - 99 mg/dL   BUN 82 (H) 6 - 20 mg/dL   Creatinine, Ser 3.83 (H) 0.61 - 1.24 mg/dL   Calcium 8.7 (L) 8.9 - 10.3 mg/dL   GFR calc non Af Amer 13 (L) >60 mL/min   GFR calc Af Amer 15 (L) >60 mL/min    Comment: (NOTE) The eGFR has been calculated using the CKD EPI equation. This calculation has not been validated in all clinical situations. eGFR's persistently <60 mL/min signify possible Chronic  Kidney Disease.    Anion gap 12 5 - 15    Comment: Performed at [facility] [address], [place], [place] [address]  Glucose, capillary     Status: Abnormal   Collection Time: 10/27/17  7:57 AM  Result Value Ref Range   Glucose-Capillary 122 (H) 65 - 99 mg/dL    Dg Chest 2 View  Result Date: 10/25/2017 CLINICAL DATA:  Shortness of Breath EXAM: CHEST - 2 VIEW COMPARISON:  01/17/2015 FINDINGS: Cardiomegaly. Vascular congestion and interstitial prominence, likely early interstitial edema. No confluent opacities or effusions. No acute bony abnormality. IMPRESSION: Cardiomegaly with vascular congestion and likely early interstitial edema. Electronically Signed   By: Rolm Baptise M.D.   On: 10/25/2017 13:42   Nm Pulmonary Vent And Perf (v/q Scan)  Result Date: 10/26/2017 CLINICAL DATA:  Shortness of Breath EXAM: NUCLEAR MEDICINE VENTILATION - PERFUSION LUNG SCAN TECHNIQUE: Ventilation images were obtained in multiple projections using inhaled aerosol Tc-13mDTPA. Perfusion images were obtained in multiple projections after intravenous  injection of Tc-67mMAA. RADIOPHARMACEUTICALS:  31.6 mCi of Tc-966mTPA aerosol inhalation and 4.3 mCi Tc9971mA IV COMPARISON:  Chest x-ray 10/25/2017 FINDINGS: Ventilation: No focal ventilation defect. Perfusion: No wedge shaped peripheral perfusion defects to suggest acute pulmonary embolism. IMPRESSION: No evidence of pulmonary embolus. Electronically Signed   By: KevRolm BaptiseD.   On: 10/26/2017 11:15   Dg Foot Complete Left  Result Date: 10/25/2017 CLINICAL DATA:  Left foot pain and swelling, no known injury, initial encounter EXAM: LEFT FOOT - COMPLETE 3+ VIEW COMPARISON:  None. FINDINGS: Hallux valgus deformity is noted. Densely calcified first toenail is seen. No acute fracture or dislocation is noted. Tarsal degenerative changes are seen. Mild soft tissue swelling is noted about the metatarsals. Vascular calcifications are seen. IMPRESSION: Chronic  changes without acute abnormality. Electronically Signed   By: MarInez CatalinaD.   On: 10/25/2017 16:16   Dg Foot Complete Right  Result Date: 10/25/2017 CLINICAL DATA:  Right foot pain at base of 5th metatarsal. EXAM: RIGHT FOOT COMPLETE - 3+ VIEW COMPARISON:  None. FINDINGS: No fracture, subluxation or dislocation. Degenerative changes and hallux valgus at the 1st MTP joint. Bony erosions noted at the tarsal metatarsal joints. Vascular calcifications noted. Plantar calcaneal spur. IMPRESSION: No acute bony abnormality. Bony erosions at the tarsal metatarsal joints. Hallux valgus deformity. Electronically Signed   By: KevRolm BaptiseD.   On: 10/25/2017 13:56    Review of Systems  Constitutional: Negative for weight loss.  HENT: Negative for ear discharge, ear pain, hearing loss and tinnitus.   Eyes: Negative for blurred vision, double vision, photophobia and pain.  Respiratory: Negative for cough, sputum production and shortness of breath.   Cardiovascular: Negative for chest pain.  Gastrointestinal: Negative for abdominal pain, nausea and vomiting.  Genitourinary: Negative for dysuria, flank pain, frequency and urgency.  Musculoskeletal: Positive for joint pain (Bilateral feet, R>L). Negative for back pain, falls, myalgias and neck pain.  Neurological: Negative for dizziness, tingling, sensory change, focal weakness, loss of consciousness and headaches.  Endo/Heme/Allergies: Does not bruise/bleed easily.  Psychiatric/Behavioral: Negative for depression, memory loss and substance abuse. The patient is not nervous/anxious.    Blood pressure (!) 158/68, pulse 66, temperature 98 F (36.7 C), temperature source Oral, resp. rate 18, height 6' 1"  (1.854 m), weight 97.2 kg (214 lb 4.6 oz), SpO2 98 %. Physical Exam  Constitutional: He appears well-developed and well-nourished. No distress.  HENT:  Head: Normocephalic and atraumatic.  Eyes: Conjunctivae are normal. Right eye exhibits no discharge.  Left eye exhibits no discharge. No scleral icterus.  Neck: Normal range of motion.  Cardiovascular: Normal rate and regular rhythm.  Respiratory: Effort normal. No respiratory distress.  Musculoskeletal:  RLE No traumatic wounds, ecchymosis, or rash  Mod TTP dorsum of foot, lat>med  No knee or ankle effusion  Knee stable to varus/ valgus and anterior/posterior stress  Sens DPN, SPN, TN intact  Motor EHL, ext, flex, evers 5/5  DP 1+, PT 0, 2+ NP edema  LLE No traumatic wounds, ecchymosis, or rash  Nontender  No knee or ankle effusion  Knee stable to varus/ valgus and anterior/posterior stress  Sens DPN, SPN, TN intact  Motor EHL, ext, flex, evers 5/5  DP 1+, PT 0, No significant edema  Neurological: He is alert.  Skin: Skin is warm and dry. He is not diaphoretic.  Psychiatric: He has a normal mood and affect. His behavior is normal.    Assessment/Plan: Foot pain -- Unsure of etiology at this  point. Gout/psuedogout certainly in the differential. Will check inflammatory markers and uric acid level.  Update -- Inflammatory markers and uric acid level elevated. Would treat this as gout. No singular joint involvement that would be amenable to aspiration/injection. He may f/u with Dr. Percell Miller as OP if pain doesn't resolve, will need MRI if that is the case. Ok to discharge from orthopedic standpoint.    Lisette Abu, PA-C Orthopedic Surgery [phone] 10/27/2017, 9:29 AM

## 2017-10-27 NOTE — Progress Notes (Signed)
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  Admitted for acute on chronic diastolic CHF in context of stage V chronic kidney disease (not dialysis candidate), mild hyperkalemia and right foot pain.     PT Comments    Pt making slow progress due to continued pain in rt foot.    Follow Up Recommendations  Home health PT;Supervision/Assistance - 24 hour;Other (comment)(Rec HH aide )     Financial risk analyst (measurements PT)    Recommendations for Other Services       Precautions / Restrictions Precautions Precautions: Fall Restrictions Weight Bearing Restrictions: No    Mobility  Bed Mobility Overal bed mobility: Needs Assistance Bed Mobility: Rolling;Sidelying to Sit   Sidelying to sit: Min assist       General bed mobility comments: Pt up in chair  Transfers Overall transfer level: Needs assistance Equipment used: Rolling walker (2 wheeled) Transfers: Sit to/from Stand Sit to Stand: Mod assist   Squat pivot transfers: Min assist     General transfer comment: Assist to bring hips and trunk up.  Ambulation/Gait Ambulation/Gait assistance: Min assist Ambulation Distance (Feet): 5 Feet Assistive device: Rolling walker (2 wheeled) Gait Pattern/deviations: Shuffle;Step-to pattern;Decreased step length - right;Decreased step length - left;Decreased stance time - right;Trunk flexed Gait velocity: decr Gait velocity interpretation: Below normal speed for age/gender General Gait  Details: Assist for balance and support. Verbal cues to stand more erect and use UE's to unweight RLE for pain control   Stairs            Wheelchair Mobility    Modified Rankin (Stroke Patients Only)       Balance Overall balance assessment: Needs assistance Sitting-balance support: No upper extremity supported;Feet supported Sitting balance-Leahy Scale: Fair     Standing balance support: Bilateral upper extremity supported Standing balance-Leahy Scale: Poor Standing balance comment: walker and min guard for static standing                            Cognition Arousal/Alertness: Awake/alert Behavior During Therapy: WFL for tasks assessed/performed Overall Cognitive Status: Within Functional Limits for tasks assessed                                        Exercises      General Comments        Pertinent Vitals/Pain Pain Assessment: Faces Faces Pain Scale: Hurts even more Pain Location: rt foot Pain Descriptors / Indicators: Aching;Grimacing;Guarding Pain Intervention(s): Limited activity within patient's tolerance;Monitored during session    Home Living Family/patient expects to be discharged to:: Private residence Living Arrangements: Children Available Help at Discharge: Family;Available 24 hours/day Type of Home: House Home Access: Stairs to enter;Ramped entrance(and stairs) Entrance Stairs-Rails: Right Home Layout: One level Home Equipment: Walker - 2 wheels;Shower seat - built in      Prior Function Level of Independence: Independent with assistive device(s);Needs assistance    ADL's /  Homemaking Assistance Needed: daughter assisted with LB ADL Comments: Amb with RW   PT Goals (current goals can now be found in the care plan section) Acute Rehab PT Goals Patient Stated Goal: move better Progress towards PT goals: Progressing toward goals    Frequency    Min 3X/week      PT Plan Current plan remains  appropriate    Co-evaluation              AM-PAC PT "6 Clicks" Daily Activity  Outcome Measure  Difficulty turning over in bed (including adjusting bedclothes, sheets and blankets)?: A Little Difficulty moving from lying on back to sitting on the side of the bed? : A Lot Difficulty sitting down on and standing up from a chair with arms (e.g., wheelchair, bedside commode, etc,.)?: Unable Help needed moving to and from a bed to chair (including a wheelchair)?: A Lot Help needed walking in hospital room?: A Lot Help needed climbing 3-5 steps with a railing? : Total 6 Click Score: 11    End of Session Equipment Utilized During Treatment: Gait belt Activity Tolerance: Patient limited by pain Patient left: in chair;with call bell/phone within reach;with chair alarm set;with family/visitor present Nurse Communication: Mobility status PT Visit Diagnosis: Unsteadiness on feet (R26.81);Other abnormalities of gait and mobility (R26.89);Pain Pain - Right/Left: Right Pain - part of body: Ankle and joints of foot     Time: 1120-1133 PT Time Calculation (min) (ACUTE ONLY): 13 min  Charges:  $Gait Training: 8-22 mins                    G Codes:       [facility] PT [place] 10/27/2017, 1:09 PM

## 2017-10-27 NOTE — Progress Notes (Signed)
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  Admitted for acute on chronic diastolic CHF in context of stage V chronic kidney disease (not dialysis candidate), mild hyperkalemia and right foot pain.    Clinical Impression   PTA, pt lived with his daughter and was modified independent @ RW level. Daughter assisted with ADL as needed.Pt able to perform squat pivot/lateral scoot transfer to chair with min A. Feel pt can DC home with [place] and Reader at wc level if he has 24/7 A. Will follow acutely.     Follow Up Recommendations  Home health OT;Supervision/Assistance - 24 hour    Equipment Recommendations  Wheelchair (measurements OT);Wheelchair cushion (measurements OT)(removable armrests)    Recommendations for Other Services PT consult     Precautions / Restrictions Precautions Precautions: Fall Restrictions Weight Bearing Restrictions: No      Mobility Bed Mobility Overal bed mobility: Needs Assistance Bed Mobility:    Sidelying to sit: Min assist          Transfers Overall transfer level: Needs assistance   Transfers: Squat Pivot Transfers     Squat pivot transfers: Min assist          Balance Overall balance assessment: Needs assistance   Sitting balance-Leahy Scale: Fair                                     ADL either performed or assessed with clinical judgement   ADL Overall ADL's : Needs assistance/impaired     Grooming: Set up;Sitting   Upper Body  Bathing: Set up;Sitting   Lower Body Bathing: Minimal assistance;Sitting/lateral leans   Upper Body Dressing : Set up;Sitting   Lower Body Dressing: Moderate assistance;Sitting/lateral leans   Toilet Transfer: Minimal assistance;Squat-pivot(simulated)   Toileting- Clothing Manipulation and Hygiene: Minimal assistance       Functional mobility during ADLs: Minimal assistance(simulated to wc) General ADL Comments: May benefit fromaE     Vision         Perception     Praxis      Pertinent Vitals/Pain Pain Assessment: Faces Faces Pain Scale: Hurts little more Pain Location: R foot Pain Descriptors / Indicators: Aching;Grimacing;Guarding Pain Intervention(s): Limited activity within patient's tolerance;Repositioned     Hand Dominance Right   Extremity/Trunk Assessment Upper Extremity Assessment Upper Extremity Assessment: Generalized weakness   Lower Extremity Assessment Lower Extremity Assessment: Defer to PT evaluation RLE Deficits / Details: Painful R Foot, lateral aspect; very painful with weight bearing; noted swelling compared to L foot, tender to palpation   Cervical / Trunk Assessment Cervical / Trunk Assessment: Normal   Communication Communication Communication: No difficulties   Cognition Arousal/Alertness: Awake/alert Behavior During Therapy: WFL for tasks assessed/performed Overall Cognitive Status: Within Functional Limits for tasks assessed                                     General Comments  Exercises     Shoulder Instructions      Home Living Family/patient expects to be discharged to:: Private residence Living Arrangements: Children Available Help at Discharge: Family;Available 24 hours/day Type of Home: House Home Access: Stairs to enter;Ramped entrance(and stairs) Entrance Stairs-Number of Steps: 5 Entrance Stairs-Rails: Right Home Layout: One level     Bathroom Shower/Tub: Radiographer, therapeutic: (elevated, possibly handicap or comfort height) Bathroom Accessibility: Yes How Accessible: Accessible via wheelchair;Accessible via walker Home Equipment: Walker - 2 wheels;Shower seat - built in          Prior Functioning/Environment Level of Independence: Independent with assistive device(s);Needs assistance    ADL's / Homemaking Assistance Needed: daughter assisted with LB ADL   Comments: Amb with RW        OT Problem List: Decreased strength;Decreased range of motion;Decreased activity tolerance;Impaired balance (sitting and/or standing);Decreased safety awareness;Decreased knowledge of use of DME or AE;Pain;Increased edema      OT Treatment/Interventions: Self-care/ADL training;Therapeutic exercise;Energy conservation;DME and/or AE instruction;Therapeutic activities;Patient/family education;Balance training    OT Goals(Current goals can be found in the care plan section) Acute Rehab OT Goals Patient Stated Goal: move better OT Goal Formulation: With patient Time For Goal Achievement: 11/10/17 Potential to Achieve Goals: Good  OT Frequency: Min 2X/week   Barriers to D/C:            Co-evaluation              AM-PAC PT "6 Clicks" Daily Activity     Outcome Measure Help from another person eating meals?: None Help from another person taking care of personal grooming?: None Help from another person toileting, which includes using toliet, bedpan, or urinal?: A Little Help from another person bathing (including washing, rinsing, drying)?: A Little Help from another person to put on and taking off regular upper body clothing?: A Little Help from another person to put on and taking off regular lower body clothing?: A Little 6 Click Score: 20   End of Session Equipment Utilized During Treatment: Gait belt Nurse Communication: Mobility status  Activity Tolerance: Patient tolerated treatment well Patient left: in chair;with call bell/phone within reach;with  chair alarm set  OT Visit Diagnosis: Other abnormalities of gait and mobility (R26.89);Muscle weakness (generalized) (M62.81);Pain Pain - Right/Left: Right Pain - part of body: Ankle and joints of foot                Time: 0950-1019 OT Time Calculation (min): 29 min Charges:  OT General Charges $OT Visit: 1 Visit OT Evaluation $OT Eval Moderate Complexity: 1 Mod OT Treatments $Self Care/Home Management : 8-22 mins G-Codes:     [facility], OT/L  [phone] 10/27/2017  Rossana Molchan,HILLARY 10/27/2017, 10:40 AM

## 2017-10-27 NOTE — Progress Notes (Addendum)
PROGRESS NOTE   [name] [name]  HXT:[id number]    DOB: [birth year]    DOA: 10/25/2017  PCP: System, Pcp Not In   I have briefly reviewed patients previous medical records in [facility].  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  Admitted for acute on chronic diastolic CHF in context of stage V chronic kidney disease (not dialysis candidate), mild hyperkalemia and right foot pain, possibly due to acute gout.  Improving.  Assessment & Plan:   Principal Problem:   Acute on chronic diastolic CHF (congestive heart failure) (HCC) Active Problems:   OBSTRUCTIVE SLEEP APNEA   Essential hypertension   PVD   History of cardiovascular disorder   DM (diabetes mellitus), type 2 with renal complications (HCC)   Dyslipidemia   CKD (chronic kidney disease), stage V (HCC)   Acute on chronic diastolic heart failure (HCC)   Right foot pain   Hyperkalemia   Acute on chronic diastolic CHF: Likely precipitated by advanced chronic kidney disease and dietary indiscretion.  Daughter reports steady weight at 223 pounds but initial weight in the ED 255 pounds-this may have been inaccurate.  Received IV Lasix 80 mg x1 in ED and diuresed well.  -2.3 L since admission.  Weight down from 217 to 212 pounds since admission.  Strict intake output and daily weights.  Counseled regarding low-salt diet.  Seen by advanced heart failure MD at [facility] on 3/6, felt to be stable and advised to return in 1 year.  TTE 11/17/14: LVEF 60-65% and mild aortic stenosis.  Cardiologist did not feel need to repeat his echocardiogram.    VQ scan negative. Improving but still volume  overloaded.  Continue IV Lasix 60 mg twice daily for additional 24 hours and then consider discharging home on prior home doses of Lasix.  -3.1 L since admission.  Weight slightly inaccurate, gone up by 2 pounds since yesterday.  Stage V chronic kidney disease: Baseline creatinine said to be between 3.7-3.9 and likely at baseline.  Monitor closely while on IV Lasix.  Patient not interested in HD and is very clear about it.  Creatinine remained stable.  Follow BMP in a.m.  Mild hyperkalemia: Renal diet. Resolved.  Right foot pain/possible acute gout/hyperuricemia: X-rays without acute findings.  Right lower extremity venous Doppler negative for DVT.    Although pain has somewhat improved, still has pain and difficulty in weightbearing.  Hence orthopedics consulted on 3/11: Suspect acute gout of right foot without singular joint involvement, recommend treating as such and if does not resolve then MRI and outpatient follow-up with Dr. Percell Miller.  Started prednisone 40 mg daily times 3 days on 3/11.  Monitor for response and CBGs given diabetic.  PT and OT have evaluated.  Unable to use NSAIDs or colchicine due to advanced renal disease.  Consider starting allopurinol after acute phase has resolved.  Resistant hypertension: Verified patient's polypharmacy with his daughter on admission.  Continue prior home dose of clonidine, hydralazine, labetalol, amlodipine and Imdur.  Fluctuating.  No change in home regimen.  Add as needed IV hydralazine.  Type II DM with renal complications: Not on oral hypoglycemics PTA.  SSI.  Reasonable inpatient  control.  Could worsen with initiation of steroids, monitor closely.  Pancytopenia: Reported MDS.  Stable.  CVA with residual left hemiparesis: Continue aspirin.    PT and OT input appreciated.  OSA: Unable to tolerate CPAP.  Hyperlipidemia  Left foot callus: Outpatient podiatry consultation.     DVT prophylaxis: Heparin Code Status: DNR Family  Communication: None at bedside Disposition: DC home pending improvement in right foot pain with ability to better weight-bear without discomfort.   Consultants:  None  Procedures:  None  Antimicrobials:  None   Subjective: Right foot pain has improved but still has pain and difficulty weightbearing on that foot.  Due to same, unstable gait.  Dyspnea improved and close to baseline.  No chest pain.  ROS: As above.  Objective:  Vitals:   10/26/17 1823 10/26/17 1921 10/27/17 0458 10/27/17 1130  BP: (!) 178/69 (!) 176/86 (!) 158/68 (!) 194/80  Pulse:  (!) 59 66 64  Resp:  18 18   Temp:  97.6 F (36.4 C) 98 F (36.7 C) 98.6 F (37 C)  TempSrc:  Oral Oral Oral  SpO2:  98% 98% 97%  Weight:   97.2 kg (214 lb 4.6 oz)   Height:        Examination:  General exam: Pleasant elderly male, moderately built and nourished, sitting up comfortably in bed. Respiratory system: Occasional basal crackles but otherwise clear to auscultation.  No increased work of breathing. Cardiovascular system: S1 and S2 heard, RRR.  No JVD or murmurs.  Trace ankle edema bilaterally.  More right dorsal foot edema likely inflammatory rather than from CHF.  Telemetry: SB in the 50s-SR. Gastrointestinal system: Abdomen is nondistended, soft and nontender. No organomegaly or masses felt. Normal bowel sounds heard.  Stable without change. Central nervous system: Alert and oriented. No focal neurological deficits.  Stable without change. Extremities: Right limbs grade 5 x 5 power in left limbs grade 4 x 5 power.  Still quite tender dorsal lateral aspect of right midfoot with associated pitting edema.  Has callus over left lateral mid plantar aspect on left foot Skin: No rashes, lesions or ulcers Psychiatry: Judgement and insight appear normal. Mood & affect appropriate.     Data Reviewed: I have personally reviewed following labs and imaging studies  CBC: Recent Labs  Lab 10/25/17 1530 10/26/17 0146  WBC  2.8* 3.5*  NEUTROABS 1.6*  --   HGB 9.3* 9.1*  HCT 28.5* 27.4*  MCV 92.5 92.3  PLT 124* 841*   Basic Metabolic Panel: Recent Labs  Lab 10/25/17 1419 10/26/17 0146 10/27/17 0532  NA 134* 137 137  K 5.3* 4.1 3.8  CL 102 103 102  CO2 20* 23 23  GLUCOSE 115* 100* 129*  BUN 88* 87* 82*  CREATININE 3.90* 3.78* 3.83*  CALCIUM 9.1 9.0 8.7*   CBG: Recent Labs  Lab 10/26/17 1200 10/26/17 1628 10/26/17 2102 10/27/17 0757 10/27/17 1129  GLUCAP 135* 129* 159* 122* 131*    No results found for this or any previous visit (from the past 240 hour(s)).       Radiology Studies: Dg Chest 2 View  Result Date: 10/25/2017 CLINICAL DATA:  Shortness of Breath EXAM: CHEST - 2 VIEW COMPARISON:  01/17/2015 FINDINGS: Cardiomegaly. Vascular congestion and interstitial prominence, likely early interstitial edema. No confluent opacities or effusions. No acute bony abnormality. IMPRESSION: Cardiomegaly with vascular congestion and likely early interstitial edema. Electronically Signed   By: Rolm Baptise M.D.   On: 10/25/2017 13:42   Nm  Pulmonary Vent And Perf (v/q Scan)  Result Date: 10/26/2017 CLINICAL DATA:  Shortness of Breath EXAM: NUCLEAR MEDICINE VENTILATION - PERFUSION LUNG SCAN TECHNIQUE: Ventilation images were obtained in multiple projections using inhaled aerosol Tc-50m DTPA. Perfusion images were obtained in multiple projections after intravenous injection of Tc-55m-MAA. RADIOPHARMACEUTICALS:  31.6 mCi of Tc-27m DTPA aerosol inhalation and 4.3 mCi Tc35m-MAA IV COMPARISON:  Chest x-ray 10/25/2017 FINDINGS: Ventilation: No focal ventilation defect. Perfusion: No wedge shaped peripheral perfusion defects to suggest acute pulmonary embolism. IMPRESSION: No evidence of pulmonary embolus. Electronically Signed   By: Rolm Baptise M.D.   On: 10/26/2017 11:15   Dg Foot Complete Left  Result Date: 10/25/2017 CLINICAL DATA:  Left foot pain and swelling, no known injury, initial encounter EXAM: LEFT  FOOT - COMPLETE 3+ VIEW COMPARISON:  None. FINDINGS: Hallux valgus deformity is noted. Densely calcified first toenail is seen. No acute fracture or dislocation is noted. Tarsal degenerative changes are seen. Mild soft tissue swelling is noted about the metatarsals. Vascular calcifications are seen. IMPRESSION: Chronic changes without acute abnormality. Electronically Signed   By: Inez Catalina M.D.   On: 10/25/2017 16:16   Dg Foot Complete Right  Result Date: 10/25/2017 CLINICAL DATA:  Right foot pain at base of 5th metatarsal. EXAM: RIGHT FOOT COMPLETE - 3+ VIEW COMPARISON:  None. FINDINGS: No fracture, subluxation or dislocation. Degenerative changes and hallux valgus at the 1st MTP joint. Bony erosions noted at the tarsal metatarsal joints. Vascular calcifications noted. Plantar calcaneal spur. IMPRESSION: No acute bony abnormality. Bony erosions at the tarsal metatarsal joints. Hallux valgus deformity. Electronically Signed   By: Rolm Baptise M.D.   On: 10/25/2017 13:56        Scheduled Meds: . amLODipine  10 mg Oral QHS  . aspirin EC  81 mg Oral Daily  . cloNIDine  0.3 mg Oral TID  . diclofenac sodium  4 g Topical QID  . furosemide  60 mg Intravenous BID  . gabapentin  100 mg Oral QHS  . gabapentin  200 mg Oral Q0600  . heparin  5,000 Units Subcutaneous Q8H  . hydrALAZINE  100 mg Oral TID  . insulin aspart  0-9 Units Subcutaneous TID WC  . isosorbide mononitrate  60 mg Oral QPM  . labetalol  400 mg Oral TID  . latanoprost  1 drop Left Eye QHS  . polyvinyl alcohol  1 drop Right Eye QHS  . pravastatin  40 mg Oral Daily  . predniSONE  40 mg Oral Q breakfast  . primidone  250 mg Oral QHS  . primidone  50 mg Oral 2 times per day  . tamsulosin  0.4 mg Oral Daily   Continuous Infusions:   LOS: 1 day     Vernell Leep, MD, FACP, [facility]. [facility] Pager [phone]  If 7PM-7AM, please contact night-coverage [email or web address] Password [email or web address] 10/27/2017, 12:25 PM

## 2017-10-28 ENCOUNTER — Inpatient Hospital Stay (HOSPITAL_COMMUNITY): Payer: Medicare Other

## 2017-10-28 LAB — GLUCOSE, CAPILLARY
GLUCOSE-CAPILLARY: 180 mg/dL — AB (ref 65–99)
Glucose-Capillary: 122 mg/dL — ABNORMAL HIGH (ref 65–99)
Glucose-Capillary: 211 mg/dL — ABNORMAL HIGH (ref 65–99)
Glucose-Capillary: 182 mg/dL — ABNORMAL HIGH (ref 65–99)

## 2017-10-28 LAB — BASIC METABOLIC PANEL
ANION GAP: 12 (ref 5–15)
BUN: 86 mg/dL — ABNORMAL HIGH (ref 6–20)
CHLORIDE: 102 mmol/L (ref 101–111)
CO2: 21 mmol/L — ABNORMAL LOW (ref 22–32)
Calcium: 8.5 mg/dL — ABNORMAL LOW (ref 8.9–10.3)
Creatinine, Ser: 3.92 mg/dL — ABNORMAL HIGH (ref 0.61–1.24)
GFR calc Af Amer: 15 mL/min — ABNORMAL LOW (ref >60)
GFR, EST NON AFRICAN AMERICAN: 13 mL/min — AB (ref >60)
GLUCOSE: 127 mg/dL — AB (ref 65–99)
POTASSIUM: 3.9 mmol/L (ref 3.5–5.1)
Sodium: 135 mmol/L (ref 135–145)

## 2017-10-28 MED ORDER — POLYETHYLENE GLYCOL 3350 17 G PO PACK
17.0000 g | PACK | Freq: Every day | ORAL | Status: DC
  Administered 2017-10-28: 17 g via ORAL
  Filled 2017-10-28 (×2): qty 1

## 2017-10-28 MED ORDER — GUAIFENESIN-DM 100-10 MG/5ML PO SYRP
5.0000 mL | ORAL_SOLUTION | ORAL | Status: DC | PRN
  Administered 2017-10-28: 5 mL via ORAL
  Filled 2017-10-28: qty 5

## 2017-10-28 MED ORDER — METHYLPREDNISOLONE SODIUM SUCC 125 MG IJ SOLR
60.0000 mg | Freq: Two times a day (BID) | INTRAMUSCULAR | Status: DC
  Administered 2017-10-28 (×2): 60 mg via INTRAVENOUS
  Filled 2017-10-28 (×3): qty 2

## 2017-10-28 MED ORDER — HYDRALAZINE HCL 50 MG PO TABS
150.0000 mg | ORAL_TABLET | Freq: Three times a day (TID) | ORAL | Status: DC
  Administered 2017-10-28 – 2017-10-29 (×4): 150 mg via ORAL
  Filled 2017-10-28 (×4): qty 3

## 2017-10-28 NOTE — Care Management Note (Signed)
Case Management Note  Patient Details  Name: [name] [name] MRN: [id number] Date of Birth: 09/10/1928  Subjective/Objective:  CHF                 Action/Plan: Patient lives at home with his daughter; PCP- [facility], has private insurance with Medicare / Lynda Rain[place] with prescription drug coverage; pharmacy of choice is [facility]; patient states that he has no problem getting his medication; his daughter takes him to his apts; DME - walker at home, he is requesting an IT trainer wheelchair at discharge; Dan with [place] called; [place] choices offered, pt/ daughter chose [facility]; [place] with [facility] called for arrangements. CM will continue to follow for progression of care.  Expected Discharge Date:   possibly 10/29/2017               Expected Discharge Plan:  [place]  Discharge planning Services  CM Consult  Choice offered to:  Patient, Adult Children  DME Arranged:  Media planner DME Agency:  [place]:  RN, Disease Management, PT, OT [place] Agency:  [facility]  Status of Service:  In process, will continue to follow  Sherrilyn Rist [phone] 10/28/2017, 3:07 PM

## 2017-10-28 NOTE — Progress Notes (Signed)
PROGRESS NOTE   [name] [name]  QQV:[id number]    DOB: 27-Jul-1929    DOA: 10/25/2017  PCP: System, Pcp Not In   I have briefly reviewed patients previous medical records in [facility].  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,  myelodysplasia, Known chronic diastolic heart failure last EF 65-70% 09/2014 Peripheral arterial disease Admitted 10/25/2017 left foot lower extremity pain Found to be in acute diastolic heart failure weight usually 223 pounds but on admission 255 Lab workup showed pancytopenia K5.3 creatinine 3.9 duplex negative for DVT  Assessment & Plan:   Principal Problem:   Acute on chronic diastolic CHF (congestive heart failure) (HCC) Active Problems:   OBSTRUCTIVE SLEEP APNEA   Essential hypertension   PVD   History of cardiovascular disorder   DM (diabetes mellitus), type 2 with renal complications (HCC)   Dyslipidemia   CKD (chronic kidney disease), stage V (HCC)   Acute on chronic diastolic heart failure (HCC)   Right foot pain   Hyperkalemia   Acute on chronic diastolic CHF: Likely precipitated by advanced chronic kidney disease and dietary indiscretion.  Daughter reports steady weight at 223 pounds but initial weight in the ED 255 pounds-??accurate.  Received IV Lasix 80 mg x1 in ED and diuresed well.    Weight down from 217 to 212-->208 pounds since admission.   Seen by advanced heart failure MD at [facility] on 3/6, felt to be stable and advised to return in 1 year.  TTE 11/17/14: LVEF 60-65% and mild aortic stenosis.  Cardiologist did not feel need to repeat his echocardiogram.    VQ scan negative. Improving but still volume overloaded.  Continue IV Lasix 60 mg twice daily for now  -4.23 L since admission.   Stage V chronic kidney disease: Baseline creatinine said to be between 3.7-3.9 and likely at baseline.  Monitor closely while on IV Lasix.  Patient not interested in  HD and is very clear about it.  Creatinine remained stable.  Follow BMP in a.m.  Mild hyperkalemia: Renal diet. Resolved.  Right foot pain/possible acute gout/hyperuricemia: X-rays without acute findings.  Right lower extremity venous Doppler negative for DVT.    Although pain has somewhat improved, still has pain and difficulty in weightbearing.  Hence orthopedics consulted on 3/11: Suspect acute gout of right foot without singular joint involvement, recommend treating as such and if does not resolve then MRI and outpatient follow-up with Dr. Percell Miller.  Started prednisone 40 mg daily times 3 days on 3/11--am increasing to solu-medrol 40 bid on 3/12 as no signif real improvement  Monitor for response and CBGs given diabetic.  PT and OT have evaluated.  Unable to use NSAIDs or colchicine due to advanced renal disease.  Consider starting allopurinol after acute phase has resolved. Getting foot xrays as pattenr of pain not entirely consistent with gout   Resistant hypertension: Verified patient's polypharmacy with his daughter on admission.  Continue prior home dose of clonidine, hydralazine, labetalol, amlodipine and Imdur. Increasing hydralazine to 150 tid from 3/12  Type II DM with renal complications: Not on oral hypoglycemics PTA.  SSI.  cbg 122-251  Pancytopenia: Reported MDS.  Stable.  CVA with residual left hemiparesis: Continue aspirin.    PT and OT input appreciated.  OSA: Unable to tolerate CPAP.  Hyperlipidemia  Left foot callus: Outpatient podiatry consultation.     DVT prophylaxis:  Heparin Code Status: DNR Family Communication: called relative, no answer 3/12 Disposition: DC home when able to bear weight more on LE  Consultants:  None  Procedures:  None  Antimicrobials:  None   Subjective:  Awake alert pain still 8/10  Cannot bear weight on leg No cp No fever constipated+ No n/v   Objective:  Vitals:   10/27/17 1130 10/27/17 1331 10/27/17 2137  10/28/17 0633  BP: (!) 194/80 (!) 142/53 (!) 165/59 (!) 162/64  Pulse: 64 62 65 60  Resp:    18  Temp: 98.6 F (37 C)  98.3 F (36.8 C) 98.2 F (36.8 C)  TempSrc: Oral  Oral Oral  SpO2: 97%  99% 100%  Weight:    94.7 kg (208 lb 11.2 oz)  Height:        Examination:  eomi ncat no ict no pallor cta b Blowing HSM across precordium-->carotids abd soft nt RLE not swollen, tender over 1st mtp and forefoot     Data Reviewed: I have personally reviewed following labs and imaging studies  CBC: Recent Labs  Lab 10/25/17 1530 10/26/17 0146  WBC 2.8* 3.5*  NEUTROABS 1.6*  --   HGB 9.3* 9.1*  HCT 28.5* 27.4*  MCV 92.5 92.3  PLT 124* 950*   Basic Metabolic Panel: Recent Labs  Lab 10/25/17 1419 10/26/17 0146 10/27/17 0532 10/28/17 0425  NA 134* 137 137 135  K 5.3* 4.1 3.8 3.9  CL 102 103 102 102  CO2 20* 23 23 21*  GLUCOSE 115* 100* 129* 127*  BUN 88* 87* 82* 86*  CREATININE 3.90* 3.78* 3.83* 3.92*  CALCIUM 9.1 9.0 8.7* 8.5*   CBG: Recent Labs  Lab 10/26/17 2102 10/27/17 0757 10/27/17 1129 10/27/17 1635 10/27/17 2134  GLUCAP 159* 122* 131* 191* 251*    No results found for this or any previous visit (from the past 240 hour(s)).       Radiology Studies: Nm Pulmonary Vent And Perf (v/q Scan)  Result Date: 10/26/2017 CLINICAL DATA:  Shortness of Breath EXAM: NUCLEAR MEDICINE VENTILATION - PERFUSION LUNG SCAN TECHNIQUE: Ventilation images were obtained in multiple projections using inhaled aerosol Tc-54m DTPA. Perfusion images were obtained in multiple projections after intravenous injection of Tc-16m-MAA. RADIOPHARMACEUTICALS:  31.6 mCi of Tc-25m DTPA aerosol inhalation and 4.3 mCi Tc70m-MAA IV COMPARISON:  Chest x-ray 10/25/2017 FINDINGS: Ventilation: No focal ventilation defect. Perfusion: No wedge shaped peripheral perfusion defects to suggest acute pulmonary embolism. IMPRESSION: No evidence of pulmonary embolus. Electronically Signed   By: Rolm Baptise  M.D.   On: 10/26/2017 11:15        Scheduled Meds: . amLODipine  10 mg Oral QHS  . aspirin EC  81 mg Oral Daily  . cloNIDine  0.3 mg Oral TID  . furosemide  60 mg Intravenous BID  . gabapentin  100 mg Oral QHS  . gabapentin  200 mg Oral Q0600  . heparin  5,000 Units Subcutaneous Q8H  . hydrALAZINE  100 mg Oral TID  . insulin aspart  0-9 Units Subcutaneous TID WC  . isosorbide mononitrate  60 mg Oral QPM  . labetalol  400 mg Oral TID  . latanoprost  1 drop Left Eye QHS  . polyvinyl alcohol  1 drop Right Eye QHS  . pravastatin  40 mg Oral Daily  . predniSONE  40 mg Oral Q breakfast  . primidone  250 mg Oral QHS  . primidone  50 mg Oral 2 times per day  . tamsulosin  0.4  mg Oral Daily   Continuous Infusions:   LOS: 2 days     Verneita Griffes, MD [facility]   If 7PM-7AM, please contact night-coverage [email or web address] Password [facility] 10/28/2017, 7:27 AM

## 2017-10-29 LAB — COMPREHENSIVE METABOLIC PANEL
ALK PHOS: 46 U/L (ref 38–126)
ALT: 25 U/L (ref 17–63)
AST: 13 U/L — AB (ref 15–41)
Albumin: 3.3 g/dL — ABNORMAL LOW (ref 3.5–5.0)
Anion gap: 12 (ref 5–15)
BILIRUBIN TOTAL: 0.4 mg/dL (ref 0.3–1.2)
BUN: 92 mg/dL — AB (ref 6–20)
CHLORIDE: 100 mmol/L — AB (ref 101–111)
CO2: 22 mmol/L (ref 22–32)
CREATININE: 4 mg/dL — AB (ref 0.61–1.24)
Calcium: 8.7 mg/dL — ABNORMAL LOW (ref 8.9–10.3)
GFR calc Af Amer: 14 mL/min — ABNORMAL LOW (ref >60)
GFR, EST NON AFRICAN AMERICAN: 12 mL/min — AB (ref >60)
Glucose, Bld: 171 mg/dL — ABNORMAL HIGH (ref 65–99)
Potassium: 4.4 mmol/L (ref 3.5–5.1)
Sodium: 134 mmol/L — ABNORMAL LOW (ref 135–145)
TOTAL PROTEIN: 6.8 g/dL (ref 6.5–8.1)

## 2017-10-29 LAB — CBC WITH DIFFERENTIAL/PLATELET
Basophils Absolute: 0 10*3/uL (ref 0.0–0.1)
Basophils Relative: 0 %
EOS ABS: 0 10*3/uL (ref 0.0–0.7)
EOS PCT: 0 %
HCT: 28.2 % — ABNORMAL LOW (ref 39.0–52.0)
Hemoglobin: 9.4 g/dL — ABNORMAL LOW (ref 13.0–17.0)
LYMPHS ABS: 0.6 10*3/uL — AB (ref 0.7–4.0)
Lymphocytes Relative: 22 %
MCH: 30.4 pg (ref 26.0–34.0)
MCHC: 33.3 g/dL (ref 30.0–36.0)
MCV: 91.3 fL (ref 78.0–100.0)
Monocytes Absolute: 0.1 10*3/uL (ref 0.1–1.0)
Monocytes Relative: 5 %
Neutro Abs: 2.1 10*3/uL (ref 1.7–7.7)
Neutrophils Relative %: 73 %
PLATELETS: 132 10*3/uL — AB (ref 150–400)
RBC: 3.09 MIL/uL — AB (ref 4.22–5.81)
RDW: 13.9 % (ref 11.5–15.5)
WBC: 2.9 10*3/uL — AB (ref 4.0–10.5)

## 2017-10-29 LAB — GLUCOSE, CAPILLARY
GLUCOSE-CAPILLARY: 131 mg/dL — AB (ref 65–99)
Glucose-Capillary: 136 mg/dL — ABNORMAL HIGH (ref 65–99)
Glucose-Capillary: 143 mg/dL — ABNORMAL HIGH (ref 65–99)

## 2017-10-29 MED ORDER — CLONIDINE HCL 0.3 MG PO TABS: 0.3000 mg | ORAL_TABLET | Freq: Three times a day (TID) | ORAL | 0 refills | Status: DC

## 2017-10-29 MED ORDER — FUROSEMIDE 80 MG PO TABS
80.0000 mg | ORAL_TABLET | Freq: Two times a day (BID) | ORAL | Status: DC
  Administered 2017-10-29: 80 mg via ORAL
  Filled 2017-10-29: qty 1

## 2017-10-29 MED ORDER — PREDNISONE 50 MG PO TABS: ORAL_TABLET | ORAL | 0 refills | Status: DC

## 2017-10-29 NOTE — Progress Notes (Signed)
Pt ambulating with walker approximately 1/3 of the hallway (there and back). Pt reported fatigue upon returning, rested for a few minutes seated; then proceeded back to room with little to no difficulty.  Pt still explains that bearing weight on his right foot does cause discomfort, but that it is much improved.   He is somewhat unsteady in his gait.  PT available for their previously ordered consult, requested they evaluate the patient next.

## 2017-10-29 NOTE — Progress Notes (Signed)
Per Zara Council, Case manager wheelchair has been requested, will be brought to the room.

## 2017-10-29 NOTE — Progress Notes (Signed)
Physical Therapy Treatment Patient Details Name: [name] [name] MRN: [id number] DOB: 1929/04/29 Today's Date: 10/29/2017    History of Present Illness 84 year old male, lives with his daughter, ambulates with the help of a walker, extensive PMH including chronic diastolic CHF, resistant HTN, DM 2, HLD, OSA not on CPAP, stage V chronic kidney disease (never wants to be on HD), CVA with residual left upper extremity weakness, tremors, low-grade MDS, left foot callus, presented with right foot pain and worsening dyspnea.  Admitted for acute on chronic diastolic CHF in context of stage V chronic kidney disease (not dialysis candidate), mild hyperkalemia and right foot pain.     PT Comments    Pt performed increased activity during session, including stair training for safe entry into home.  Pt will benefit from [facility] for extended distances including MD appointment and use of ramped entrance into home.  Daughter reports she will be present with patient 24 hours a day.  Issued gait belt for use on stairs and during mobility as needed.     Follow Up Recommendations  Home health PT;Supervision/Assistance - 24 hour;Other (comment)(rec HH aide)     Financial risk analyst (measurements PT)    Recommendations for Other Services       Precautions / Restrictions Precautions Precautions: Fall Restrictions Weight Bearing Restrictions: No    Mobility  Bed Mobility               General bed mobility comments: Pt up in chair  Transfers Overall transfer level: Needs assistance Equipment used: Rolling walker (2 wheeled) Transfers: Sit to/from Stand Sit to Stand: Min guard         General transfer comment: Cues for hand placement to and from seated surface.  Pt is much improved from previous session.    Ambulation/Gait Ambulation/Gait assistance: Min assist Ambulation Distance (Feet): 40 Feet(x2 trials.  ) Assistive device: Rolling walker (2 wheeled) Gait  Pattern/deviations: Shuffle;Decreased step length - right;Decreased step length - left;Decreased stance time - right;Trunk flexed;Step-through pattern Gait velocity: decr Gait velocity interpretation: Below normal speed for age/gender General Gait Details: Assist for balance and support.  Cues for forward gaze during gait training. Pt continues to fatigue quickly after short gait trials.     Stairs Stairs: Yes   Stair Management: One rail Right;Backwards;Forwards Number of Stairs: 4 General stair comments: Forward to ascend and backward to descend.  Pt required cues for sequencing.  Daughter present and observed technique.    Wheelchair Mobility    Modified Rankin (Stroke Patients Only)       Balance Overall balance assessment: Needs assistance   Sitting balance-Leahy Scale: Fair       Standing balance-Leahy Scale: Poor Standing balance comment: walker and min guard for static standing                            Cognition Arousal/Alertness: Awake/alert Behavior During Therapy: WFL for tasks assessed/performed Overall Cognitive Status: Within Functional Limits for tasks assessed                                        Exercises      General Comments        Pertinent Vitals/Pain Pain Assessment: No/denies pain    Home Living  Prior Function            PT Goals (current goals can now be found in the care plan section) Acute Rehab PT Goals Patient Stated Goal: move better Potential to Achieve Goals: Good Progress towards PT goals: Progressing toward goals    Frequency           PT Plan Current plan remains appropriate    Co-evaluation              AM-PAC PT "6 Clicks" Daily Activity  Outcome Measure  Difficulty turning over in bed (including adjusting bedclothes, sheets and blankets)?: A Little Difficulty moving from lying on back to sitting on the side of the bed? : A Lot Difficulty  sitting down on and standing up from a chair with arms (e.g., wheelchair, bedside commode, etc,.)?: Unable Help needed moving to and from a bed to chair (including a wheelchair)?: A Little Help needed walking in hospital room?: A Little Help needed climbing 3-5 steps with a railing? : A Little 6 Click Score: 15    End of Session Equipment Utilized During Treatment: Gait belt Activity Tolerance: Patient limited by pain Patient left: in chair;with call bell/phone within reach;with chair alarm set;with family/visitor present Nurse Communication: Mobility status PT Visit Diagnosis: Unsteadiness on feet (R26.81);Other abnormalities of gait and mobility (R26.89);Pain Pain - Right/Left: Right Pain - part of body: Ankle and joints of foot     Time: 7544-9201 PT Time Calculation (min) (ACUTE ONLY): 18 min  Charges:  $Gait Training: 8-22 mins                    G Codes:       Governor Rooks, PTA pager [phone]    Cristela Blue 10/29/2017, 11:38 AM

## 2017-10-29 NOTE — Progress Notes (Signed)
Patient now wants manuel wheelchair and not electric; [name] [name] with [facility] made aware. Mindi Slicker [facility] [phone]

## 2017-10-29 NOTE — Progress Notes (Addendum)
    Durable Medical Equipment  (From admission, onward)        Start     Ordered   10/29/17 1008  For home use only DME lightweight manual wheelchair with seat cushion  (Wheelchairs)  Once    Comments:  Patient suffers from got which impairs their ability to perform daily activities like bathing in the home.  A walker will not resolve  issue with performing activities of daily living. A wheelchair will allow patient to safely perform daily activities. Patient is not able to propel themselves in the home using a standard weight wheelchair due to general weakness. Patient can self propel in the lightweight wheelchair.  Accessories: elevating leg rests (ELRs), wheel locks, extensions and anti-tippers.   10/29/17 George West, PTA pager [phone]

## 2017-10-29 NOTE — Discharge Summary (Signed)
Physician Discharge Summary  [name] [name] KKX:[id number] DOB: [birth year] DOA: 10/25/2017  PCP: System, Pcp Not In  Admit date: 10/25/2017 Discharge date: 10/29/2017  Time spent: 25 minutes  Recommendations for Outpatient Follow-up:  1. Needs busrt of steroids for gout 2. Recommend allopurinol as OP for foot pain and gout once acute flare better 3. Needs bmet as op 1 week 4. Ordered hh walker and wc at home  Discharge Diagnoses:  Principal Problem:   Acute on chronic diastolic CHF (congestive heart failure) (HCC) Active Problems:   OBSTRUCTIVE SLEEP APNEA   Essential hypertension   PVD   History of cardiovascular disorder   DM (diabetes mellitus), type 2 with renal complications (HCC)   Dyslipidemia   CKD (chronic kidney disease), stage V (HCC)   Acute on chronic diastolic heart failure (HCC)   Right foot pain   Hyperkalemia   Discharge Condition: imporved  Diet recommendation: hh   Filed Weights   10/27/17 0458 10/28/17 0633 10/29/17 0502  Weight: 97.2 kg (214 lb 4.6 oz) 94.7 kg (208 lb 11.2 oz) 94.8 kg (208 lb 14.4 oz)    History of present illness:  84 year old male DM TY 2,  HLD,  OSA not on CPAP,  stage V CKD on dialysis,  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 Daughter reports steady weight at 223 pounds but initial weight in the ED 255 pounds-??accurate. Received IV Lasix 80 mg x1 in ED and diuresed well.    Weight down from 217 to 212-->208 pounds since admission.  Seen by advanced heart failure MD at [facility] on 3/6,  felt to be stable and advised to return in 1 year. TTE 11/17/14: LVEF 60-65% and mild aortic stenosis. Cardiologist did not feel need to repeat his echocardiogram.  VQ scan negative. Improving but still volume overloaded.  Continue IV Lasix 60 mg twice daily for now  -4.23 L since admission. Resuming home dose lasix on d/c 40 and 80 mg  Stage V chronic kidney disease: Baseline creatinine said to be between 3.7-3.9 and likely at baseline. Monitor closely while on IV Lasix. Patient not interested in HD and is very clear about it.  Creatinine remained stable.  Follow BMP in a.m.  Mild hyperkalemia:Renal diet. Resolved.  Right foot pain/possible acute gout/hyperuricemia: X-rays without acute findings. Right lower extremity venous Doppler negative for DVT.   Although pain has somewhat improved, still has pain and difficulty in weightbearing.  Hence orthopedics consulted on 3/11: Suspect acute gout of right foot without singular joint involvement, recommend treating as such and if does not resolve then MRI and outpatient follow-up with Dr. Percell Miller.  Started prednisone 40 mg daily times 3 days on 3/11--am increasing to solu-medrol 40 bid on 3/12 as no signif real improvement--placed on prednisone 50 on d/c     Consider starting allopurinol after acute phase has resolved. Getting foot xrays as pattenr of pain not entirely consistent with gout   Resistant hypertension: Verified patient's polypharmacy with his daughter on admission. Continue prior home dose of clonidine, hydralazine, labetalol, amlodipine and Imdur. Increasing hydralazine to 150 tid from 3/12  Type II DM  with renal complications:Not on oral hypoglycemics PTA. SSI.  cbg 122-251  Pancytopenia:Reported MDS.  Stable.  CVA with residual left hemiparesis:Continue aspirin.   PT and OT input appreciated.  ZHG:DJMEQA to tolerate CPAP.  Hyperlipidemia  Left foot callus: Outpatient podiatry  consultation.     Procedures:  none   Consultations:   foot xray  cardiology  Discharge Exam: Vitals:   10/29/17 0502 10/29/17 0840  BP: (!) 160/64 (!) 163/63  Pulse: 64 65  Resp: 18   Temp: 98 F (36.7 C)   SpO2: 98%     General: awake alert pleasan tin nad tol diet no severe pain Cardiovascular:  s1 s 2 blowing HSM Respiratory: clear no adde dsound Painful over top of R foot  Discharge Instructions   Discharge Instructions    Diet - low sodium heart healthy   Complete by:  As directed    Discharge instructions   Complete by:  As directed    Continue gout meds prednisone 50 mg daily for 1 week and then stop You might need some prventive meds like allopurinol-discuss this with your regula rmd ge tlabs in 1 week   Increase activity slowly   Complete by:  As directed      Allergies as of 10/29/2017      Reactions   Flu Virus Vaccine Anaphylaxis   FLU VACCINATION   Fosinopril Sodium Other (See Comments)   Unknown   Hydrocodone-acetaminophen    Other reaction(s): GI Upset (intolerance)   Meloxicam    REACTION: stomah pains, nausea, constipation   Valsartan Other (See Comments)   Other reaction(s): Other (See Comments) DIOVAN, UNKNOWN - takes losartan at home unknown      Medication List    TAKE these medications   acetaminophen 500 MG tablet Commonly known as:  TYLENOL Take 1,000 mg by mouth every 6 (six) hours as needed.   amLODipine 10 MG tablet Commonly known as:  NORVASC Take 10 mg by mouth at bedtime.   aspirin EC 81 MG tablet Take 81 mg by mouth daily.   augmented betamethasone dipropionate 0.05 % ointment Commonly known as:  DIPROLENE-AF   cloNIDine 0.2 MG tablet Commonly known as:  CATAPRES Take 1 tablet (0.2 mg total) by mouth 3 (three) times daily. What changed:  Another medication with the same name was changed. Make sure you understand how and when to take each.   cloNIDine 0.3 MG tablet Commonly known as:  CATAPRES Take 1  tablet (0.3 mg total) by mouth 3 (three) times daily. What changed:    medication strength  how much to take  when to take this  additional instructions   furosemide 40 MG tablet Commonly known as:  LASIX Take 40-80 mg by mouth 2 (two) times daily. Take 80 mg every morning and take 40 mg tablet at 1400   gabapentin 100 MG capsule Commonly known as:  NEURONTIN Take 100-200 mg by mouth See admin instructions. Takes three times a day Take 200 mg at 0600 and 100 at 2200   hydrALAZINE 100 MG tablet Commonly known as:  APRESOLINE Take 100 mg by mouth 3 (three) times daily. Must take takes at 0900, 1300, and 2200 to prevent drop in the blood pressure   isosorbide mononitrate 30 MG 24 hr tablet Commonly known as:  IMDUR Take 1 tablet (30 mg total) by mouth daily. What changed:    how much to take  when to take this  additional instructions   labetalol 200 MG tablet  Commonly known as:  NORMODYNE Take 400 mg by mouth 3 (three) times daily.   latanoprost 0.005 % ophthalmic solution Commonly known as:  XALATAN Place 1 drop into the left eye at bedtime.   levalbuterol 45 MCG/ACT inhaler Commonly known as:  XOPENEX HFA Inhale 2 puffs into the lungs every 4 (four) hours as needed for wheezing.   montelukast 10 MG tablet Commonly known as:  SINGULAIR Take 10 mg by mouth daily.   Polyethyl Glycol-Propyl Glycol 0.4-0.3 % Soln Place 1 drop into the right eye at bedtime.   pravastatin 40 MG tablet Commonly known as:  PRAVACHOL Take 40 mg by mouth daily.   predniSONE 50 MG tablet Commonly known as:  DELTASONE Take 1 tablet for 7 days then stop   primidone 50 MG tablet Commonly known as:  MYSOLINE Take 50-250 mg by mouth See admin instructions. Take three times a day 50 mg at 9:00 and 14:00 and 250 mg at 2200   tamsulosin 0.4 MG Caps capsule Commonly known as:  FLOMAX Take 0.4 mg by mouth daily.   traMADol 50 MG tablet Commonly known as:  ULTRAM Take 50 mg by mouth  every 6 (six) hours as needed for moderate pain.            Durable Medical Equipment  (From admission, onward)        Start     Ordered   10/29/17 1008  For home use only DME lightweight manual wheelchair with seat cushion  (Wheelchairs)  Once    Comments:  Patient suffers from got which impairs their ability to perform daily activities like bathing in the home.  A walker will not resolve  issue with performing activities of daily living. A wheelchair will allow patient to safely perform daily activities. Patient is not able to propel themselves in the home using a standard weight wheelchair due to general weakness. Patient can self propel in the lightweight wheelchair.  Accessories: elevating leg rests (ELRs), wheel locks, extensions and anti-tippers.   10/29/17 1009   10/28/17 1521  For home use only DME Wheelchair electric  Once    Comments:  Pt has CHF, resistant HTN, DM 2, HLD, OSA not on CPAP, stage V chronic kidney disease (never wants to be on HD), CVA with residual left upper extremity weakness, tremors, low-grade MDS, left foot callus - needs electric wheelchair   10/28/17 1523     Allergies  Allergen Reactions  . Flu Virus Vaccine Anaphylaxis    FLU VACCINATION  . Fosinopril Sodium Other (See Comments)    Unknown   . Hydrocodone-Acetaminophen     Other reaction(s): GI Upset (intolerance)  . Meloxicam     REACTION: stomah pains, nausea, constipation  . Valsartan Other (See Comments)    Other reaction(s): Other (See Comments) DIOVAN, UNKNOWN - takes losartan at home unknown   Follow-up Information    [facility] Follow up.   Contact information: [place]                                        [phone]       Home, [facility] Follow up.   Specialty:  [place] Why:  They will do your home health care at your home Contact information: [place] [address] [phone]  The  results of significant diagnostics from this hospitalization (including imaging, microbiology, ancillary and laboratory) are listed below for reference.    Significant Diagnostic Studies: Dg Chest 2 View  Result Date: 10/25/2017 CLINICAL DATA:  Shortness of Breath EXAM: CHEST - 2 VIEW COMPARISON:  01/17/2015 FINDINGS: Cardiomegaly. Vascular congestion and interstitial prominence, likely early interstitial edema. No confluent opacities or effusions. No acute bony abnormality. IMPRESSION: Cardiomegaly with vascular congestion and likely early interstitial edema. Electronically Signed   By: Rolm Baptise M.D.   On: 10/25/2017 13:42   Nm Pulmonary Vent And Perf (v/q Scan)  Result Date: 10/26/2017 CLINICAL DATA:  Shortness of Breath EXAM: NUCLEAR MEDICINE VENTILATION - PERFUSION LUNG SCAN TECHNIQUE: Ventilation images were obtained in multiple projections using inhaled aerosol Tc-2m DTPA. Perfusion images were obtained in multiple projections after intravenous injection of Tc-16m-MAA. RADIOPHARMACEUTICALS:  31.6 mCi of Tc-36m DTPA aerosol inhalation and 4.3 mCi Tc39m-MAA IV COMPARISON:  Chest x-ray 10/25/2017 FINDINGS: Ventilation: No focal ventilation defect. Perfusion: No wedge shaped peripheral perfusion defects to suggest acute pulmonary embolism. IMPRESSION: No evidence of pulmonary embolus. Electronically Signed   By: Rolm Baptise M.D.   On: 10/26/2017 11:15   Dg Foot Complete Left  Result Date: 10/25/2017 CLINICAL DATA:  Left foot pain and swelling, no known injury, initial encounter EXAM: LEFT FOOT - COMPLETE 3+ VIEW COMPARISON:  None. FINDINGS: Hallux valgus deformity is noted. Densely calcified first toenail is seen. No acute fracture or dislocation is noted. Tarsal degenerative changes are seen. Mild soft tissue swelling is noted about the metatarsals. Vascular calcifications are seen. IMPRESSION: Chronic changes without acute abnormality. Electronically Signed   By: Inez Catalina M.D.   On:  10/25/2017 16:16   Dg Foot Complete Right  Result Date: 10/28/2017 CLINICAL DATA:  History of gout, right foot pain and swelling over 3 days, no injury, cannot bear weight EXAM: RIGHT FOOT COMPLETE - 3+ VIEW COMPARISON:  Right foot films of 10/25/2017 FINDINGS: Again hallux valgus is noted and there is some degenerative change of the right first MTP joint. Better seen on oblique views, there is and erosive process involving the head-neck of the right first metatarsal with some soft tissue swelling most consistent with changes of gout. Also there are erosive changes at the tarsal metatarsal articulations which may represent gout as well. No acute fracture is seen. A small plantar calcaneal degenerative spur is noted. IMPRESSION: 1. Changes primarily consistent with gout involving the right first metatarsal head and the tarsal-metatarsal articulations. 2. Hallux valgus. 3. No acute abnormality. Electronically Signed   By: Ivar Drape M.D.   On: 10/28/2017 10:29   Dg Foot Complete Right  Result Date: 10/25/2017 CLINICAL DATA:  Right foot pain at base of 5th metatarsal. EXAM: RIGHT FOOT COMPLETE - 3+ VIEW COMPARISON:  None. FINDINGS: No fracture, subluxation or dislocation. Degenerative changes and hallux valgus at the 1st MTP joint. Bony erosions noted at the tarsal metatarsal joints. Vascular calcifications noted. Plantar calcaneal spur. IMPRESSION: No acute bony abnormality. Bony erosions at the tarsal metatarsal joints. Hallux valgus deformity. Electronically Signed   By: Rolm Baptise M.D.   On: 10/25/2017 13:56    Microbiology: No results found for this or any previous visit (from the past 240 hour(s)).   Labs: Basic Metabolic Panel: Recent Labs  Lab 10/25/17 1419 10/26/17 0146 10/27/17 0532 10/28/17 0425 10/29/17 0503  NA 134* 137 137 135 134*  K 5.3* 4.1 3.8 3.9 4.4  CL 102 103 102 102 100*  CO2  20* 23 23 21* 22  GLUCOSE 115* 100* 129* 127* 171*  BUN 88* 87* 82* 86* 92*  CREATININE  3.90* 3.78* 3.83* 3.92* 4.00*  CALCIUM 9.1 9.0 8.7* 8.5* 8.7*   Liver Function Tests: Recent Labs  Lab 10/29/17 0503  AST 13*  ALT 25  ALKPHOS 46  BILITOT 0.4  PROT 6.8  ALBUMIN 3.3*   No results for input(s): LIPASE, AMYLASE in the last 168 hours. No results for input(s): AMMONIA in the last 168 hours. CBC: Recent Labs  Lab 10/25/17 1530 10/26/17 0146 10/29/17 0503  WBC 2.8* 3.5* 2.9*  NEUTROABS 1.6*  --  2.1  HGB 9.3* 9.1* 9.4*  HCT 28.5* 27.4* 28.2*  MCV 92.5 92.3 91.3  PLT 124* 122* 132*   Cardiac Enzymes: No results for input(s): CKTOTAL, CKMB, CKMBINDEX, TROPONINI in the last 168 hours. BNP: BNP (last 3 results) Recent Labs    10/25/17 1419  BNP 1,670.6*    ProBNP (last 3 results) No results for input(s): PROBNP in the last 8760 hours.  CBG: Recent Labs  Lab 10/28/17 0729 10/28/17 1122 10/28/17 1613 10/28/17 2049 10/29/17 0738  GLUCAP 122* 180* 211* 182* 131*       Signed:  Nita Sells MD   [facility] 10/29/2017, 10:16 AM

## 2017-10-29 NOTE — Progress Notes (Signed)
Occupational Therapy Treatment Patient Details Name: [name] [name] MRN: [id number] DOB: 21-Apr-1929 Today's Date: 10/29/2017    History of present illness 84 year old male, lives with his daughter, ambulates with the help of a walker, extensive PMH including chronic diastolic CHF, resistant HTN, DM 2, HLD, OSA not on CPAP, stage V chronic kidney disease (never wants to be on HD), CVA with residual left upper extremity weakness, tremors, low-grade MDS, left foot callus, presented with right foot pain and worsening dyspnea.  Admitted for acute on chronic diastolic CHF in context of stage V chronic kidney disease (not dialysis candidate), mild hyperkalemia and right foot pain.    OT comments  Pt progressing towards OT goals. Session included transfer, LB dressing and education on shower transfer. The patient and his daughter have expressed that doing a shower transfer at home is his (and her) biggest concern. Education provided for safety during this transfers. Home health OT is an essential part of recovery for this patient (and his family) to maximize independence and safety in ADL and IADL.    Follow Up Recommendations  Home health OT;Supervision/Assistance - 24 hour    Equipment Recommendations  Wheelchair (measurements OT);Wheelchair cushion (measurements OT)(removable arm rests)    Recommendations for Other Services PT consult    Precautions / Restrictions Precautions Precautions: Fall Restrictions Weight Bearing Restrictions: No       Mobility Bed Mobility               General bed mobility comments: Pt up in chair  Transfers Overall transfer level: Needs assistance Equipment used: Rolling walker (2 wheeled) Transfers: Sit to/from Stand Sit to Stand: Min guard         General transfer comment: good carryover of safe hand placement    Balance Overall balance assessment: Needs assistance Sitting-balance support: No upper extremity supported;Feet  supported Sitting balance-Leahy Scale: Fair     Standing balance support: Bilateral upper extremity supported Standing balance-Leahy Scale: Poor Standing balance comment: walker and min guard for static standing                           ADL either performed or assessed with clinical judgement   ADL Overall ADL's : Needs assistance/impaired                     Lower Body Dressing: Moderate assistance;Sitting/lateral leans Lower Body Dressing Details (indicate cue type and reason): to adjust socks - ed on compensatory strategies  Toilet Transfer: Min Fish farm manager Details (indicate cue type and reason): good hand placement for safety Toileting- Clothing Manipulation and Hygiene: Min guard;Sit to/from stand         General ADL Comments: Pt will still require/benefit from skilled OT in the [facility] setting     Vision       Perception     Praxis      Cognition Arousal/Alertness: Awake/alert Behavior During Therapy: WFL for tasks assessed/performed Overall Cognitive Status: Within Functional Limits for tasks assessed                                          Exercises     Shoulder Instructions       General Comments Pt's daughter present throughout session - verbalized importance of ADL traning and therapy    Pertinent Vitals/ Pain  Pain Assessment: No/denies pain Pain Intervention(s): Monitored during session  Home Living                                          Prior Functioning/Environment              Frequency  Min 2X/week        Progress Toward Goals  OT Goals(current goals can now be found in the care plan section)  Progress towards OT goals: Progressing toward goals  Acute Rehab OT Goals Patient Stated Goal: get in the shower safely OT Goal Formulation: With patient Time For Goal Achievement: 11/10/17 Potential to Achieve Goals: Good  Plan Discharge plan remains  appropriate;Frequency remains appropriate    Co-evaluation                 AM-PAC PT "6 Clicks" Daily Activity     Outcome Measure   Help from another person eating meals?: None Help from another person taking care of personal grooming?: None Help from another person toileting, which includes using toliet, bedpan, or urinal?: A Little Help from another person bathing (including washing, rinsing, drying)?: A Little Help from another person to put on and taking off regular upper body clothing?: A Little Help from another person to put on and taking off regular lower body clothing?: A Little 6 Click Score: 20    End of Session Equipment Utilized During Treatment: Gait belt;Rolling walker  OT Visit Diagnosis: Other abnormalities of gait and mobility (R26.89);Muscle weakness (generalized) (M62.81);Pain Pain - Right/Left: Right Pain - part of body: Ankle and joints of foot   Activity Tolerance Patient tolerated treatment well   Patient Left in chair;with call bell/phone within reach;with family/visitor present   Nurse Communication Mobility status        Time: 4097-3532 OT Time Calculation (min): 20 min  Charges: OT General Charges $OT Visit: 1 Visit OT Treatments $Self Care/Home Management : 8-22 mins  Hulda Humphrey OTR/L [place] 10/29/2017, 1:48 PM

## 2017-10-29 NOTE — Progress Notes (Signed)
Patient resting comfortably during shift report. Denies complaints.  

## 2018-07-22 ENCOUNTER — Encounter (HOSPITAL_COMMUNITY): Payer: Self-pay | Admitting: Emergency Medicine

## 2018-07-22 ENCOUNTER — Inpatient Hospital Stay (HOSPITAL_COMMUNITY)
Admission: EM | Admit: 2018-07-22 | Discharge: 2018-07-26 | DRG: 291 | Disposition: A | Discharge status: Home Health | Payer: Medicare Other | Attending: Internal Medicine | Admitting: Internal Medicine

## 2018-07-22 ENCOUNTER — Emergency Department (HOSPITAL_COMMUNITY): Payer: Medicare Other

## 2018-07-22 DIAGNOSIS — E8770 Fluid overload, unspecified: Secondary | ICD-10-CM | POA: Diagnosis present

## 2018-07-22 DIAGNOSIS — Z833 Family history of diabetes mellitus: Secondary | ICD-10-CM | POA: Diagnosis not present

## 2018-07-22 DIAGNOSIS — N2581 Secondary hyperparathyroidism of renal origin: Secondary | ICD-10-CM | POA: Diagnosis present

## 2018-07-22 DIAGNOSIS — Z87891 Personal history of nicotine dependence: Secondary | ICD-10-CM | POA: Diagnosis not present

## 2018-07-22 DIAGNOSIS — E785 Hyperlipidemia, unspecified: Secondary | ICD-10-CM | POA: Diagnosis present

## 2018-07-22 DIAGNOSIS — E1129 Type 2 diabetes mellitus with other diabetic kidney complication: Secondary | ICD-10-CM | POA: Diagnosis present

## 2018-07-22 DIAGNOSIS — N400 Enlarged prostate without lower urinary tract symptoms: Secondary | ICD-10-CM | POA: Diagnosis present

## 2018-07-22 DIAGNOSIS — E1122 Type 2 diabetes mellitus with diabetic chronic kidney disease: Secondary | ICD-10-CM | POA: Diagnosis present

## 2018-07-22 DIAGNOSIS — I132 Hypertensive heart and chronic kidney disease with heart failure and with stage 5 chronic kidney disease, or end stage renal disease: Principal | ICD-10-CM | POA: Diagnosis present

## 2018-07-22 DIAGNOSIS — N186 End stage renal disease: Secondary | ICD-10-CM | POA: Diagnosis present

## 2018-07-22 DIAGNOSIS — Z888 Allergy status to other drugs, medicaments and biological substances status: Secondary | ICD-10-CM

## 2018-07-22 DIAGNOSIS — M109 Gout, unspecified: Secondary | ICD-10-CM | POA: Diagnosis present

## 2018-07-22 DIAGNOSIS — I509 Heart failure, unspecified: Secondary | ICD-10-CM | POA: Diagnosis not present

## 2018-07-22 DIAGNOSIS — J45909 Unspecified asthma, uncomplicated: Secondary | ICD-10-CM | POA: Diagnosis present

## 2018-07-22 DIAGNOSIS — K5900 Constipation, unspecified: Secondary | ICD-10-CM | POA: Diagnosis not present

## 2018-07-22 DIAGNOSIS — D638 Anemia in other chronic diseases classified elsewhere: Secondary | ICD-10-CM | POA: Diagnosis present

## 2018-07-22 DIAGNOSIS — R5381 Other malaise: Secondary | ICD-10-CM

## 2018-07-22 DIAGNOSIS — E8779 Other fluid overload: Secondary | ICD-10-CM | POA: Diagnosis not present

## 2018-07-22 DIAGNOSIS — Z886 Allergy status to analgesic agent status: Secondary | ICD-10-CM

## 2018-07-22 DIAGNOSIS — I351 Nonrheumatic aortic (valve) insufficiency: Secondary | ICD-10-CM | POA: Diagnosis not present

## 2018-07-22 DIAGNOSIS — D61818 Other pancytopenia: Secondary | ICD-10-CM | POA: Diagnosis present

## 2018-07-22 DIAGNOSIS — I340 Nonrheumatic mitral (valve) insufficiency: Secondary | ICD-10-CM | POA: Diagnosis not present

## 2018-07-22 DIAGNOSIS — G4733 Obstructive sleep apnea (adult) (pediatric): Secondary | ICD-10-CM | POA: Diagnosis present

## 2018-07-22 DIAGNOSIS — R079 Chest pain, unspecified: Secondary | ICD-10-CM

## 2018-07-22 DIAGNOSIS — I10 Essential (primary) hypertension: Secondary | ICD-10-CM | POA: Diagnosis present

## 2018-07-22 DIAGNOSIS — Z7951 Long term (current) use of inhaled steroids: Secondary | ICD-10-CM

## 2018-07-22 DIAGNOSIS — Z7982 Long term (current) use of aspirin: Secondary | ICD-10-CM

## 2018-07-22 DIAGNOSIS — J4521 Mild intermittent asthma with (acute) exacerbation: Secondary | ICD-10-CM | POA: Diagnosis not present

## 2018-07-22 DIAGNOSIS — Z96653 Presence of artificial knee joint, bilateral: Secondary | ICD-10-CM | POA: Diagnosis present

## 2018-07-22 DIAGNOSIS — D696 Thrombocytopenia, unspecified: Secondary | ICD-10-CM | POA: Diagnosis present

## 2018-07-22 DIAGNOSIS — I639 Cerebral infarction, unspecified: Secondary | ICD-10-CM

## 2018-07-22 DIAGNOSIS — D649 Anemia, unspecified: Secondary | ICD-10-CM

## 2018-07-22 DIAGNOSIS — D6959 Other secondary thrombocytopenia: Secondary | ICD-10-CM | POA: Diagnosis present

## 2018-07-22 DIAGNOSIS — Z8673 Personal history of transient ischemic attack (TIA), and cerebral infarction without residual deficits: Secondary | ICD-10-CM

## 2018-07-22 DIAGNOSIS — Z887 Allergy status to serum and vaccine status: Secondary | ICD-10-CM

## 2018-07-22 DIAGNOSIS — I5043 Acute on chronic combined systolic (congestive) and diastolic (congestive) heart failure: Secondary | ICD-10-CM | POA: Diagnosis present

## 2018-07-22 LAB — BASIC METABOLIC PANEL
Anion gap: 15 (ref 5–15)
BUN: 83 mg/dL — AB (ref 8–23)
CALCIUM: 9.5 mg/dL (ref 8.9–10.3)
CO2: 21 mmol/L — ABNORMAL LOW (ref 22–32)
CREATININE: 4.59 mg/dL — AB (ref 0.61–1.24)
Chloride: 100 mmol/L (ref 98–111)
GFR calc Af Amer: 12 mL/min — ABNORMAL LOW (ref >60)
GFR calc non Af Amer: 11 mL/min — ABNORMAL LOW (ref >60)
Glucose, Bld: 133 mg/dL — ABNORMAL HIGH (ref 70–99)
Potassium: 4.2 mmol/L (ref 3.5–5.1)
SODIUM: 136 mmol/L (ref 135–145)

## 2018-07-22 LAB — CBC
HCT: 25.8 % — ABNORMAL LOW (ref 39.0–52.0)
Hemoglobin: 7.9 g/dL — ABNORMAL LOW (ref 13.0–17.0)
MCH: 29.5 pg (ref 26.0–34.0)
MCHC: 30.6 g/dL (ref 30.0–36.0)
MCV: 96.3 fL (ref 80.0–100.0)
PLATELETS: 146 10*3/uL — AB (ref 150–400)
RBC: 2.68 MIL/uL — ABNORMAL LOW (ref 4.22–5.81)
RDW: 13.7 % (ref 11.5–15.5)
WBC: 3.8 10*3/uL — AB (ref 4.0–10.5)
nRBC: 0 % (ref 0.0–0.2)

## 2018-07-22 LAB — I-STAT TROPONIN, ED: TROPONIN I, POC: 0.06 ng/mL (ref 0.00–0.08)

## 2018-07-22 LAB — BRAIN NATRIURETIC PEPTIDE: B Natriuretic Peptide: 2271.6 pg/mL — ABNORMAL HIGH (ref 0.0–100.0)

## 2018-07-22 LAB — PROCALCITONIN: Procalcitonin: <0.1 ng/mL

## 2018-07-22 LAB — GLUCOSE, CAPILLARY: Glucose-Capillary: 127 mg/dL — ABNORMAL HIGH (ref 70–99)

## 2018-07-22 LAB — TROPONIN I: Troponin I: 0.06 ng/mL (ref <0.03)

## 2018-07-22 MED ORDER — FUROSEMIDE 10 MG/ML IJ SOLN: 80.0000 mg | Freq: Once | INTRAMUSCULAR | Status: DC

## 2018-07-22 MED ORDER — CLONIDINE HCL 0.3 MG PO TABS
0.1500 mg | ORAL_TABLET | Freq: Two times a day (BID) | ORAL | Status: DC
  Administered 2018-07-23 – 2018-07-26 (×7): 0.15 mg via ORAL
  Filled 2018-07-22 (×7): qty 2

## 2018-07-22 MED ORDER — ALLOPURINOL 100 MG PO TABS
50.0000 mg | ORAL_TABLET | Freq: Every day | ORAL | Status: DC
  Administered 2018-07-23 – 2018-07-25 (×3): 50 mg via ORAL
  Filled 2018-07-22 (×4): qty 1

## 2018-07-22 MED ORDER — POLYETHYLENE GLYCOL 3350 17 G PO PACK: 17.0000 g | PACK | Freq: Every day | ORAL | Status: DC | PRN

## 2018-07-22 MED ORDER — LABETALOL HCL 200 MG PO TABS
400.0000 mg | ORAL_TABLET | Freq: Two times a day (BID) | ORAL | Status: DC
  Administered 2018-07-22 – 2018-07-26 (×8): 400 mg via ORAL
  Filled 2018-07-22 (×8): qty 2

## 2018-07-22 MED ORDER — LATANOPROST 0.005 % OP SOLN
1.0000 [drp] | Freq: Every day | OPHTHALMIC | Status: DC
  Administered 2018-07-23 – 2018-07-25 (×3): 1 [drp] via OPHTHALMIC
  Filled 2018-07-22: qty 2.5

## 2018-07-22 MED ORDER — CALCITRIOL 0.5 MCG PO CAPS
1.0000 ug | ORAL_CAPSULE | ORAL | Status: DC
  Administered 2018-07-24: 1 ug via ORAL
  Filled 2018-07-22 (×2): qty 2

## 2018-07-22 MED ORDER — PRAVASTATIN SODIUM 40 MG PO TABS
40.0000 mg | ORAL_TABLET | Freq: Every day | ORAL | Status: DC
  Administered 2018-07-23 – 2018-07-26 (×4): 40 mg via ORAL
  Filled 2018-07-22 (×4): qty 1

## 2018-07-22 MED ORDER — POLYETHYL GLYCOL-PROPYL GLYCOL 0.4-0.3 % OP SOLN: 1.0000 [drp] | Freq: Every day | OPHTHALMIC | Status: DC

## 2018-07-22 MED ORDER — ASPIRIN EC 81 MG PO TBEC
81.0000 mg | DELAYED_RELEASE_TABLET | Freq: Every day | ORAL | Status: DC
  Administered 2018-07-22 – 2018-07-25 (×4): 81 mg via ORAL
  Filled 2018-07-22 (×4): qty 1

## 2018-07-22 MED ORDER — ALLOPURINOL 100 MG PO TABS: 50.0000 mg | ORAL_TABLET | ORAL | Status: DC

## 2018-07-22 MED ORDER — MONTELUKAST SODIUM 10 MG PO TABS
10.0000 mg | ORAL_TABLET | Freq: Every day | ORAL | Status: DC
  Administered 2018-07-22 – 2018-07-25 (×4): 10 mg via ORAL
  Filled 2018-07-22 (×4): qty 1

## 2018-07-22 MED ORDER — ISOSORBIDE MONONITRATE ER 60 MG PO TB24
60.0000 mg | ORAL_TABLET | Freq: Every day | ORAL | Status: DC
  Administered 2018-07-23 – 2018-07-25 (×3): 60 mg via ORAL
  Filled 2018-07-22 (×3): qty 1

## 2018-07-22 MED ORDER — ALLOPURINOL 100 MG PO TABS
100.0000 mg | ORAL_TABLET | Freq: Every day | ORAL | Status: DC
  Administered 2018-07-23 – 2018-07-26 (×6): 100 mg via ORAL
  Filled 2018-07-22 (×4): qty 1

## 2018-07-22 MED ORDER — TAMSULOSIN HCL 0.4 MG PO CAPS
0.4000 mg | ORAL_CAPSULE | Freq: Every day | ORAL | Status: DC
  Administered 2018-07-23 – 2018-07-26 (×4): 0.4 mg via ORAL
  Filled 2018-07-22 (×4): qty 1

## 2018-07-22 MED ORDER — HEPARIN SODIUM (PORCINE) 5000 UNIT/ML IJ SOLN
5000.0000 [IU] | Freq: Three times a day (TID) | INTRAMUSCULAR | Status: DC
  Administered 2018-07-22 – 2018-07-26 (×11): 5000 [IU] via SUBCUTANEOUS
  Filled 2018-07-22 (×11): qty 1

## 2018-07-22 MED ORDER — FUROSEMIDE 10 MG/ML IJ SOLN
60.0000 mg | Freq: Once | INTRAMUSCULAR | Status: AC
  Administered 2018-07-22: 60 mg via INTRAVENOUS
  Filled 2018-07-22: qty 6

## 2018-07-22 MED ORDER — ALBUTEROL SULFATE (2.5 MG/3ML) 0.083% IN NEBU: 2.5000 mg | INHALATION_SOLUTION | Freq: Two times a day (BID) | RESPIRATORY_TRACT | Status: DC

## 2018-07-22 MED ORDER — FLUTICASONE PROPIONATE 50 MCG/ACT NA SUSP
1.0000 | Freq: Two times a day (BID) | NASAL | Status: DC
  Administered 2018-07-23 – 2018-07-26 (×6): 1 via NASAL
  Filled 2018-07-22: qty 16

## 2018-07-22 MED ORDER — ALBUTEROL SULFATE (2.5 MG/3ML) 0.083% IN NEBU: 2.5000 mg | INHALATION_SOLUTION | Freq: Four times a day (QID) | RESPIRATORY_TRACT | Status: DC | PRN

## 2018-07-22 NOTE — ED Provider Notes (Signed)
[facility] EMERGENCY DEPARTMENT Provider Note   CSN: [id number] Arrival date & time: 07/22/18  1525     History   Chief Complaint Chief Complaint  Patient presents with  . Chest Pain    HPI [name] [name] is a 84 y.o. male with history of CHF, CKD, diabetes mellitus, hypertension, hyperlipidemia, stroke presents for evaluation of acute onset, progressively worsening shortness of breath for 3 days.  He also reports intermittent left-sided chest pain which he describes as sharp and radiates to the midline.  Endorses orthopnea, PND, has been sleeping on 3 pillows at night.  His daughter notes a cough productive of clear sputum, no fevers but he does endorse generalized weakness.  Denies abdominal pain, nausea, or vomiting.  No recent travel or surgeries, no hemoptysis, no prior history of DVT or PE, not on hormone replacement therapy.  His Lasix dose was decreased 3 weeks ago due to worsening kidney function.  Reports chronic bilateral lower extremity edema.  He has not tried anything for his symptoms.  The history is provided by the patient.    Past Medical History:  Diagnosis Date  . CHF (congestive heart failure) ([place])   . CKD (chronic kidney disease), stage IV ([place]) 10/25/2017  . Diabetes mellitus without complication ([place])   . Hyperlipidemia   . Hypertension   . Stroke [facility])    2009    Patient Active Problem List   Diagnosis Date Noted  . Acute exacerbation of CHF (congestive heart failure) ([place]) 07/22/2018  . CKD (chronic kidney disease), stage V ([place]) 10/26/2017  . Acute on chronic diastolic heart failure ([place]) 10/26/2017  . Right foot pain   . Hyperkalemia   . Acute on chronic diastolic CHF (congestive heart failure) ([place]) 10/25/2017  . DM (diabetes mellitus), type 2 with renal complications ([place]) 26/37/8588  . Dyslipidemia 10/25/2017  . AORTIC VALVE SCLEROSIS 02/24/2009  . PERIPHERAL EDEMA 11/29/2008  . THROMBOCYTOPENIA 11/25/2008  . PVD  11/25/2008  . RESTING TREMOR 11/11/2008  . BENIGN PROSTATIC HYPERTROPHY, WITH OBSTRUCTION 07/04/2008  . ASTHMA 07/17/2007  . OBSTRUCTIVE SLEEP APNEA 06/22/2007  . OVERACTIVE BLADDER 06/15/2007  . History of cardiovascular disorder 03/30/2007  . Essential hypertension 03/23/2007  . DEGENERATIVE JOINT DISEASE 03/23/2007    Past Surgical History:  Procedure Laterality Date  . NECK SURGERY     lump removed  . REPLACEMENT TOTAL KNEE     Left and Right        Home Medications    Prior to Admission medications   Medication Sig Start Date End Date Taking? Authorizing Provider  acetaminophen (TYLENOL) 500 MG tablet Take 1,000 mg by mouth every 6 (six) hours as needed.   Yes [provider]  albuterol (PROVENTIL HFA;VENTOLIN HFA) 108 (90 Base) MCG/ACT inhaler Inhale 2 puffs into the lungs 2 (two) times daily. 05/21/18  Yes [provider]  allopurinol (ZYLOPRIM) 100 MG tablet Take 50-100 mg by mouth See admin instructions. Take 100 mg at 10:00 am and take 50 mg at 1800 05/15/18  Yes [provider]  amLODipine (NORVASC) 10 MG tablet Take 10 mg by mouth at bedtime.    Yes [provider]  aspirin EC 81 MG tablet Take 81 mg by mouth at bedtime.    Yes [provider]  augmented betamethasone dipropionate (DIPROLENE-AF) 0.05 % ointment Apply 1 application topically 2 (two) times daily. On the back of his head 09/23/17  Yes [provider]  calcitRIOL (ROCALTROL) 0.5 MCG capsule Take  1 mcg by mouth every Monday, Wednesday, and Friday. 06/23/18  Yes [provider]  cloNIDine (CATAPRES) 0.3 MG tablet Take 1 tablet (0.3 mg total) by mouth 3 (three) times daily. Patient taking differently: Take 0.015 mg by mouth 2 (two) times daily.  10/29/17  Yes Nita Sells, MD  fluticasone (FLONASE) 50 MCG/ACT nasal spray Place 1 spray into both nostrils 2 (two) times daily.   Yes [provider]  furosemide (LASIX) 40 MG tablet Take 80  mg by mouth daily.    Yes [provider]  hydrALAZINE (APRESOLINE) 100 MG tablet Take 100 mg by mouth 3 (three) times daily. Must take takes at 0900, 1300, and 2200 to prevent drop in the blood pressure   Yes [provider]  isosorbide mononitrate (IMDUR) 30 MG 24 hr tablet Take 1 tablet (30 mg total) by mouth daily. Patient taking differently: Take 60 mg by mouth daily.  09/24/14  Yes Tat, Shanon Brow, MD  labetalol (NORMODYNE) 200 MG tablet Take 400 mg by mouth 2 (two) times daily.    Yes [provider]  latanoprost (XALATAN) 0.005 % ophthalmic solution Place 1 drop into the left eye at bedtime.   Yes [provider]  montelukast (SINGULAIR) 10 MG tablet Take 10 mg by mouth at bedtime.  10/09/17  Yes [provider]  Polyethyl Glycol-Propyl Glycol 0.4-0.3 % SOLN Place 1 drop into the right eye at bedtime.   Yes [provider]  polyethylene glycol (MIRALAX / GLYCOLAX) packet Take 17 g by mouth as needed. 05/01/18  Yes [provider]  pravastatin (PRAVACHOL) 40 MG tablet Take 40 mg by mouth daily.   Yes [provider]  tamsulosin (FLOMAX) 0.4 MG CAPS capsule Take 0.4 mg by mouth See admin instructions. Take at 1400   Yes [provider]  traMADol (ULTRAM) 50 MG tablet Take 50 mg by mouth every 6 (six) hours as needed for moderate pain.   Yes [provider]  cloNIDine (CATAPRES) 0.2 MG tablet Take 1 tablet (0.2 mg total) by mouth 3 (three) times daily. Patient not taking: Reported on 07/22/2018 09/24/14   Orson Eva, MD  predniSONE (DELTASONE) 50 MG tablet Take 1 tablet for 7 days then stop Patient not taking: Reported on 07/22/2018 10/29/17   Nita Sells, MD    Family History Family History  Problem Relation Age of Onset  . Diabetes Brother     Social History Social History   Tobacco Use  . Smoking status: Former Research scientist (life sciences)  . Smokeless tobacco: Never Used  Substance Use Topics  . Alcohol use: No  .  Drug use: No     Allergies   Flu virus vaccine; Fosinopril sodium; Hydrocodone-acetaminophen; Meloxicam; and Valsartan   Review of Systems Review of Systems  Constitutional: Positive for fatigue. Negative for chills and fever.  Respiratory: Positive for cough and shortness of breath.   Cardiovascular: Positive for chest pain and leg swelling.  Gastrointestinal: Negative for abdominal pain, nausea and vomiting.  All other systems reviewed and are negative.    Physical Exam Updated Vital Signs BP (!) 177/80   Pulse 65   Temp 98.2 F (36.8 C) (Oral)   Resp 20   SpO2 96%   Physical Exam  Constitutional: He appears well-developed and well-nourished. No distress.  HENT:  Head: Normocephalic and atraumatic.  Eyes: Conjunctivae are normal. Right eye exhibits no discharge. Left eye exhibits no discharge.  Neck: No JVD present. No tracheal deviation present.  Cardiovascular: Normal rate  and intact distal pulses.  Murmur heard. Pulses:      Carotid pulses are 2+ on the right side, and 2+ on the left side.      Radial pulses are 2+ on the right side, and 2+ on the left side.       Dorsalis pedis pulses are 2+ on the right side, and 2+ on the left side.       Posterior tibial pulses are 2+ on the right side, and 2+ on the left side.  Systolic murmur, 2+ pitting edema of the bilateral lower extremity's.  Homans sign absent bilaterally, no palpable cords, compartments are soft  Pulmonary/Chest: Tachypnea noted. He has rales.  Mildly tachypneic, bibasilar crackles noted, left worse than right  Abdominal: Soft. Bowel sounds are normal. He exhibits no distension. There is no tenderness.  Musculoskeletal:       Right lower leg: He exhibits edema. He exhibits no tenderness.       Left lower leg: He exhibits edema. He exhibits no tenderness.  Neurological: He is alert.  Skin: Skin is warm and dry. No erythema.  Psychiatric: He has a normal mood and affect. His behavior is normal.    Nursing note and vitals reviewed.    ED Treatments / Results  Labs (all labs ordered are listed, but only abnormal results are displayed) Labs Reviewed  BASIC METABOLIC PANEL - Abnormal; Notable for the following components:      Result Value   CO2 21 (*)    Glucose, Bld 133 (*)    BUN 83 (*)    Creatinine, Ser 4.59 (*)    GFR calc non Af Amer 11 (*)    GFR calc Af Amer 12 (*)    All other components within normal limits  CBC - Abnormal; Notable for the following components:   WBC 3.8 (*)    RBC 2.68 (*)    Hemoglobin 7.9 (*)    HCT 25.8 (*)    Platelets 146 (*)    All other components within normal limits  BRAIN NATRIURETIC PEPTIDE - Abnormal; Notable for the following components:   B Natriuretic Peptide 2,271.6 (*)    All other components within normal limits  I-STAT TROPONIN, ED    EKG EKG Interpretation  Date/Time:  Wednesday July 22 2018 15:32:26 EST Ventricular Rate:  63 PR Interval:  224 QRS Duration: 102 QT Interval:  478 QTC Calculation: 489 R Axis:   -24 Text Interpretation:  Sinus rhythm with 1st degree A-V block Left ventricular hypertrophy with repolarization abnormality Prolonged QT Abnormal ECG Confirmed by Lennice Sites [phone]) on 07/22/2018 3:37:55 PM   Radiology Dg Chest 2 View  Result Date: 07/22/2018 CLINICAL DATA:  Chest pain, intermittent for 2 weeks. EXAM: CHEST - 2 VIEW COMPARISON:  Chest x-rays dated 10/25/2017 and 01/17/2015. FINDINGS: Cardiomegaly, stable compared to the most recent chest x-ray of 10/25/2017. Atherosclerotic changes noted at the aortic arch. Prominent pulmonary artery suggesting chronic pulmonary artery hypertension. Increased interstitial markings bilaterally, most confluent at the RIGHT lung base. No pleural effusion or pneumothorax seen. No acute or suspicious osseous finding. IMPRESSION: 1. Increased interstitial markings bilaterally, most confluent at the RIGHT lung base. This could represent edema or pneumonia,  likely superimposed on some degree of chronic interstitial lung disease. 2. Stable cardiomegaly. 3. Prominent pulmonary arteries suggesting chronic pulmonary artery hypertension. 4. Aortic atherosclerosis. Electronically Signed   By: Franki Cabot M.D.   On: 07/22/2018 17:44    Procedures Procedures (including critical care time)  Medications Ordered in ED Medications - No data to display   Initial Impression / Assessment and Plan / ED Course  I have reviewed the triage vital signs and the nursing notes.  Pertinent labs & imaging results that were available during my care of the patient were reviewed by me and considered in my medical decision making (see chart for details).     Patient with progressively worsening shortness of breath and chest pain for the last 3 days.  He is afebrile, hypertensive in the ED.  Nontoxic in appearance.  Bibasilar crackles noted on examination of the lungs.  Creatinine elevated at 4.59, BUN elevated at 83.  This appears to be chronic and worsening based on chart review.  Remainder lab work reviewed by me shows leukopenia and anemia.  EKG shows normal sinus rhythm with prolonged QT.  BNP elevated at greater than 2200.  Chest x-ray shows findings consistent with volume overload with possible superimposed pneumonia.  I do not feel strongly that he requires treatment for pneumonia in the absence of fever or tachycardia. Spoke with Dr. Marlowe Sax with [facility] hospitalist service who agrees to assume care of patient and bring him into the hospital for further evaluation and management of CHF exacerbation with possible pneumonia.  Final Clinical Impressions(s) / ED Diagnoses   Final diagnoses:  Acute on chronic combined systolic and diastolic congestive heart failure [facility])    ED Discharge Orders    None       Debroah Baller 07/22/18 2047    Varney Biles, MD 07/23/18 0106

## 2018-07-22 NOTE — ED Triage Notes (Signed)
Pt presents to ED for assessment of shortness of breath since last night, worse with exertion, worse with laying down.  Also c/o 8/10 left sided chest pain, no radiation.  States "I always have nausea, I always have dizziness".  Denies worsening.  This RN also noted swelling bilateral lower extremities.

## 2018-07-22 NOTE — H&P (Signed)
History and Physical    [name] [name] DGL:[id number] DOB: [birth year] DOA: 07/22/2018  PCP: System, Pcp Not In Patient coming from: Home  Chief Complaint: Shortness of breath  HPI: [name] [name] is a 84 y.o. male with medical history significant of CHF, CKD stage IV, type 2 diabetes, hypertension, hyperlipidemia, CVA presenting to the hospital for evaluation of shortness of breath.  Patient reports having fatigue, dyspnea on exertion, paroxysmal nocturnal dyspnea, orthopnea, and worsening lower extremity edema for the past few days. Symptoms have been worse today.  States he was previously taking Lasix 120 mg daily but dose was reduced to 80 mg daily 3 weeks ago due to worsening kidney function.  He is followed by nephrology at [facility] and has not been started on dialysis yet.  He does not have an access (fistula or AV graft) for dialysis.  Daughter believes that the patient has been eating more salt this past week due to Thanksgiving.  Patient also reports having left-sided chest pain which has been waking him up from sleep for the past few days.  States the pain goes away after he massages his chest.  States the chest pain is not associated with shortness of breath.  Denies having any chest pain with exertion.  Denies having any fevers or chills.  Denies any history of blood clots.  Denies having any calf pain, swelling, or erythema.  ED Course: Systolic in the 518A to 416S, diastolic in 06T to 01S.  Not hypoxic, satting well on room air.  Not tachycardic or tachypneic.  Afebrile and no leukocytosis.  BUN 83, creatinine 4.59, GFR 12. BNP 2271.  I-STAT troponin negative.  EKG showing T wave inversions in leads I, II, V5, V6 (similar to prior EKGs).  Chest x-ray showing increased interstitial markings bilaterally, most confluent at the right lung base, could represent edema or pneumonia.   Review of Systems: As per HPI otherwise 10 point review of systems negative.  Past Medical History:    Diagnosis Date  . CHF (congestive heart failure) ([place])   . CKD (chronic kidney disease), stage IV ([place]) 10/25/2017  . Diabetes mellitus without complication ([place])   . Hyperlipidemia   . Hypertension   . Stroke [facility])    2009    Past Surgical History:  Procedure Laterality Date  . NECK SURGERY     lump removed  . REPLACEMENT TOTAL KNEE     Left and Right     reports that he has quit smoking. He has never used smokeless tobacco. He reports that he does not drink alcohol or use drugs.  Allergies  Allergen Reactions  . Flu Virus Vaccine Anaphylaxis    FLU VACCINATION  . Fosinopril Sodium Other (See Comments)    Unknown   . Hydrocodone-Acetaminophen     Other reaction(s): GI Upset (intolerance)  . Meloxicam     REACTION: stomah pains, nausea, constipation  . Valsartan Other (See Comments)    Other reaction(s): Other (See Comments) DIOVAN, UNKNOWN - takes losartan at home unknown    Family History  Problem Relation Age of Onset  . Diabetes Brother     Prior to Admission medications   Medication Sig Start Date End Date Taking? Authorizing Provider  acetaminophen (TYLENOL) 500 MG tablet Take 1,000 mg by mouth every 6 (six) hours as needed.   Yes [provider]  albuterol (PROVENTIL HFA;VENTOLIN HFA) 108 (90 Base) MCG/ACT inhaler Inhale 2 puffs into the lungs 2 (two) times daily. 05/21/18  Yes  [provider]  allopurinol (ZYLOPRIM) 100 MG tablet Take 50-100 mg by mouth See admin instructions. Take 100 mg at 10:00 am and take 50 mg at 1800 05/15/18  Yes [provider]  amLODipine (NORVASC) 10 MG tablet Take 10 mg by mouth at bedtime.    Yes [provider]  aspirin EC 81 MG tablet Take 81 mg by mouth at bedtime.    Yes [provider]  augmented betamethasone dipropionate (DIPROLENE-AF) 0.05 % ointment Apply 1 application topically 2 (two) times daily. On the back of his head 09/23/17  Yes [provider]  calcitRIOL  (ROCALTROL) 0.5 MCG capsule Take 1 mcg by mouth every Monday, Wednesday, and Friday. 06/23/18  Yes [provider]  cloNIDine (CATAPRES) 0.3 MG tablet Take 1 tablet (0.3 mg total) by mouth 3 (three) times daily. Patient taking differently: Take 0.015 mg by mouth 2 (two) times daily.  10/29/17  Yes Nita Sells, MD  fluticasone (FLONASE) 50 MCG/ACT nasal spray Place 1 spray into both nostrils 2 (two) times daily.   Yes [provider]  furosemide (LASIX) 40 MG tablet Take 80 mg by mouth daily.    Yes [provider]  hydrALAZINE (APRESOLINE) 100 MG tablet Take 100 mg by mouth 3 (three) times daily. Must take takes at 0900, 1300, and 2200 to prevent drop in the blood pressure   Yes [provider]  isosorbide mononitrate (IMDUR) 30 MG 24 hr tablet Take 1 tablet (30 mg total) by mouth daily. Patient taking differently: Take 60 mg by mouth daily.  09/24/14  Yes Tat, Shanon Brow, MD  labetalol (NORMODYNE) 200 MG tablet Take 400 mg by mouth 2 (two) times daily.    Yes [provider]  latanoprost (XALATAN) 0.005 % ophthalmic solution Place 1 drop into the left eye at bedtime.   Yes [provider]  montelukast (SINGULAIR) 10 MG tablet Take 10 mg by mouth at bedtime.  10/09/17  Yes [provider]  Polyethyl Glycol-Propyl Glycol 0.4-0.3 % SOLN Place 1 drop into the right eye at bedtime.   Yes [provider]  polyethylene glycol (MIRALAX / GLYCOLAX) packet Take 17 g by mouth as needed. 05/01/18  Yes [provider]  pravastatin (PRAVACHOL) 40 MG tablet Take 40 mg by mouth daily.   Yes [provider]  tamsulosin (FLOMAX) 0.4 MG CAPS capsule Take 0.4 mg by mouth See admin instructions. Take at 1400   Yes [provider]  traMADol (ULTRAM) 50 MG tablet Take 50 mg by mouth every 6 (six) hours as needed for moderate pain.   Yes [provider]  cloNIDine (CATAPRES) 0.2 MG tablet Take 1 tablet (0.2 mg total)  by mouth 3 (three) times daily. Patient not taking: Reported on 07/22/2018 09/24/14   Orson Eva, MD  predniSONE (DELTASONE) 50 MG tablet Take 1 tablet for 7 days then stop Patient not taking: Reported on 07/22/2018 10/29/17   Nita Sells, MD    Physical Exam: Vitals:   07/22/18 1830 07/22/18 1900 07/22/18 1930 07/22/18 2059  BP: (!) 174/87 (!) 178/81 (!) 177/80 (!) 184/80  Pulse: 65 71 65 68  Resp: 17 20 20 20   Temp:    98.4 F (36.9 C)  TempSrc:    Oral  SpO2: 100% 99% 96% 100%  Weight:    92.6 kg    Physical Exam  Constitutional: He is oriented to person, place, and time. He appears well-developed and well-nourished. No distress.  HENT:  Head: Normocephalic.  Mouth/Throat: Oropharynx is clear and moist.  Eyes: Right eye exhibits no discharge. Left eye exhibits no discharge.  Neck: Neck supple.  Cardiovascular: Normal rate, regular rhythm and intact distal pulses.  Murmur heard. Pulmonary/Chest: Effort normal. No respiratory distress. He has no wheezes. He has rales.  Bibasilar rales Speaking clearly in full sentences. No increased work of breathing.  Abdominal: Soft. Bowel sounds are normal. He exhibits no distension. There is no tenderness. There is no guarding.  Musculoskeletal:  +1 pedal edema Calves appear symmetric in size. No erythema, increased warmth, or edema.  Neurological: He is alert and oriented to person, place, and time.  Skin: Skin is warm and dry. He is not diaphoretic.  Psychiatric: He has a normal mood and affect. His behavior is normal.     Labs on Admission: I have personally reviewed following labs and imaging studies  CBC: Recent Labs  Lab 07/22/18 1538  WBC 3.8*  HGB 7.9*  HCT 25.8*  MCV 96.3  PLT 213*   Basic Metabolic Panel: Recent Labs  Lab 07/22/18 1538  NA 136  K 4.2  CL 100  CO2 21*  GLUCOSE 133*  BUN 83*  CREATININE 4.59*  CALCIUM 9.5   GFR: CrCl cannot be calculated (Unknown ideal weight.). Liver Function  Tests: No results for input(s): AST, ALT, ALKPHOS, BILITOT, PROT, ALBUMIN in the last 168 hours. No results for input(s): LIPASE, AMYLASE in the last 168 hours. No results for input(s): AMMONIA in the last 168 hours. Coagulation Profile: No results for input(s): INR, PROTIME in the last 168 hours. Cardiac Enzymes: No results for input(s): CKTOTAL, CKMB, CKMBINDEX, TROPONINI in the last 168 hours. BNP (last 3 results) No results for input(s): PROBNP in the last 8760 hours. HbA1C: No results for input(s): HGBA1C in the last 72 hours. CBG: Recent Labs  Lab 07/22/18 2124  GLUCAP 127*   Lipid Profile: No results for input(s): CHOL, HDL, LDLCALC, TRIG, CHOLHDL, LDLDIRECT in the last 72 hours. Thyroid Function Tests: No results for input(s): TSH, T4TOTAL, FREET4, T3FREE, THYROIDAB in the last 72 hours. Anemia Panel: No results for input(s): VITAMINB12, FOLATE, FERRITIN, TIBC, IRON, RETICCTPCT in the last 72 hours. Urine analysis:    Component Value Date/Time   COLORURINE STRAW (A) 10/25/2017 1632   APPEARANCEUR CLEAR 10/25/2017 1632   LABSPEC 1.008 10/25/2017 1632   PHURINE 6.0 10/25/2017 1632   GLUCOSEU NEGATIVE 10/25/2017 1632   HGBUR NEGATIVE 10/25/2017 1632   BILIRUBINUR NEGATIVE 10/25/2017 [place] 10/25/2017 1632   PROTEINUR 30 (A) 10/25/2017 1632   UROBILINOGEN 1.0 09/21/2014 0035   NITRITE NEGATIVE 10/25/2017 1632   LEUKOCYTESUR NEGATIVE 10/25/2017 1632    Radiological Exams on Admission: Dg Chest 2 View  Result Date: 07/22/2018 CLINICAL DATA:  Chest pain, intermittent for 2 weeks. EXAM: CHEST - 2 VIEW COMPARISON:  Chest x-rays dated 10/25/2017 and 01/17/2015. FINDINGS: Cardiomegaly, stable compared to the most recent chest x-ray of 10/25/2017. Atherosclerotic changes noted at the aortic arch. Prominent pulmonary artery suggesting chronic pulmonary artery hypertension. Increased interstitial markings bilaterally, most confluent at the RIGHT lung base. No  pleural effusion or pneumothorax seen. No acute or suspicious osseous finding. IMPRESSION: 1. Increased interstitial markings bilaterally, most confluent at the RIGHT lung base. This could represent edema or pneumonia, likely superimposed on some degree of chronic interstitial lung disease. 2. Stable cardiomegaly. 3. Prominent pulmonary arteries suggesting chronic pulmonary artery hypertension. 4. Aortic atherosclerosis. Electronically Signed   By: Franki Cabot M.D.   On: 07/22/2018  17:44    EKG: Independently reviewed.  Sinus rhythm with first-degree AV block, LVH, T wave inversions in leads I, II, V5, V6 (similar to prior EKGs).   Assessment/Plan Principal Problem:   Volume overload Active Problems:   THROMBOCYTOPENIA   Essential hypertension   Asthma   BPH (benign prostatic hyperplasia)   Chest pain   DM (diabetes mellitus), type 2 with renal complications (HCC)   Dyslipidemia   Acute exacerbation of CHF (congestive heart failure) (HCC)   ESRD (end stage renal disease) (HCC)   Anemia of chronic disease   Gout   CVA (cerebral vascular accident) ([place])   Physical deconditioning   Volume overload secondary to acute exacerbation of chronic diastolic congestive heart failure and end-stage renal disease Patient is presenting with worsening fatigue, dyspnea on exertion, orthopnea, paroxysmal nocturnal dyspnea after dose of Lasix was reduced a few weeks ago.  In addition, he has been consuming more salt this past week.  BNP 2271.   Chest x-ray showing increased interstitial markings bilaterally, most confluent at the right lung base, could represent edema or pneumonia.  Echo done in February 2016 showing normal systolic function and grade 1 diastolic dysfunction.  Pneumonia less likely as patient is afebrile and does not have leukocytosis.  Noted to have bibasilar rales and pedal edema on exam.  PE less likely to explain his dyspnea as patient is not tachycardic, tachypneic, or hypoxic.  No  clinical signs of DVT on exam.  Satting well on room air. -Discussed case with nephrology Dr. Kathyrn Sheriff in the setting of ESRD.  She recommended giving IV Lasix 60 mg once tonight.  Nephrology will see the patient in the morning. -Repeat BMP in a.m. -Monitor intake and output -Daily weights -Echocardiogram -Dietary sodium and fluid restriction -Check procalcitonin level  Chest pain Sounds atypical based on history.  Lexiscan stress test in February 2016 was low risk.  I-STAT troponin negative.  EKG showing T wave inversions in leads I, II, V5, V6 (similar to prior EKGs).  Patient appears comfortable on exam and is currently not having chest pain. -Cycle troponin -Echocardiogram  ESRD BUN 83, creatinine 4.59, GFR 12.  Per care everywhere, creatinine in the 4-5 range and GFR less than 15 since May 2019.  Patient has not been started on dialysis yet and does not have an access (fistula or AV graft). -Discussed case with nephrology Dr. Kathyrn Sheriff.  She recommended giving IV Lasix 60 mg once tonight.  Nephrology will see the patient in the morning. -Continue home renal meds  Anemia of chronic disease Hemoglobin 7.9.  Per care everywhere, hemoglobin was 8.6 on June 04, 2018.  No signs of active bleeding at this time. -Check iron, ferritin, TIBC -Repeat CBC in a.m.  Chronic thrombocytopenia Likely related to chronic kidney disease.  Platelet count 146, stable.  No signs of active bleeding at this time. -Continue to monitor; CBC in a.m.  HTN Systolic in the 962E to 366Q, diastolic in 94T to 65Y.  -IV diuresis as above -Continue home clonidine to prevent rebound hypertension -Continue home Imdur, labetalol  Hyperlipidemia -Continue home pravastatin  Asthma -Stable.  No bronchospasm.  Continue home Singulair, albuterol as needed.  Gout -Continue home allopurinol  History of CVA -Continue home aspirin  Type 2 diabetes Currently not on home medications.  Blood glucose 133 on  arrival. -Continue to monitor CBGs -Hold insulin at this time. May add SSI-S if CBG >140.   Physical deconditioning -PT evaluation  BPH -Continue home Flomax  DVT prophylaxis: Subcutaneous heparin Code Status: Patient wishes to be full code. Family Communication: Daughter at bedside. Disposition Plan: Anticipate discharge after clinical improvement. Consults called: Nephrology (Dr. Kathyrn Sheriff) Admission status: It is my clinical opinion that admission to [place] is reasonable and necessary in this 84 y.o. male . presenting with symptoms of volume overload secondary to end-stage renal disease and acute exacerbation of chronic diastolic congestive heart failure . in the context of PMH including: ESRD, chronic diastolic congestive heart failure . with pertinent positives on physical exam including: Bibasilar rales, pedal edema . and pertinent positives on radiographic and laboratory data including: Elevated BNP, chest x-ray with evidence of pulmonary edema . Workup and treatment include IV diuresis.  Patient will be seen by nephrology in the morning, may need dialysis if his renal function continues to worsen.  Given the aforementioned, the predictability of an adverse outcome is felt to be significant. I expect that the patient will require at least 2 midnights in the hospital to treat this condition.    Shela Leff MD [facility] Pager [phone]  If 7PM-7AM, please contact night-coverage [email or web address] Password [email or web address]  07/22/2018, 10:01 PM

## 2018-07-23 ENCOUNTER — Inpatient Hospital Stay (HOSPITAL_COMMUNITY): Payer: Medicare Other

## 2018-07-23 ENCOUNTER — Other Ambulatory Visit: Payer: Self-pay

## 2018-07-23 DIAGNOSIS — E8779 Other fluid overload: Secondary | ICD-10-CM

## 2018-07-23 DIAGNOSIS — I509 Heart failure, unspecified: Secondary | ICD-10-CM

## 2018-07-23 DIAGNOSIS — I5043 Acute on chronic combined systolic (congestive) and diastolic (congestive) heart failure: Secondary | ICD-10-CM

## 2018-07-23 LAB — BASIC METABOLIC PANEL
Anion gap: 16 — ABNORMAL HIGH (ref 5–15)
BUN: 81 mg/dL — ABNORMAL HIGH (ref 8–23)
CO2: 20 mmol/L — ABNORMAL LOW (ref 22–32)
Calcium: 9.4 mg/dL (ref 8.9–10.3)
Chloride: 99 mmol/L (ref 98–111)
Creatinine, Ser: 4.43 mg/dL — ABNORMAL HIGH (ref 0.61–1.24)
GFR calc Af Amer: 13 mL/min — ABNORMAL LOW (ref >60)
GFR calc non Af Amer: 11 mL/min — ABNORMAL LOW (ref >60)
Glucose, Bld: 150 mg/dL — ABNORMAL HIGH (ref 70–99)
POTASSIUM: 3.9 mmol/L (ref 3.5–5.1)
Sodium: 135 mmol/L (ref 135–145)

## 2018-07-23 LAB — TROPONIN I
TROPONIN I: 0.07 ng/mL — AB (ref <0.03)
Troponin I: 0.06 ng/mL (ref <0.03)

## 2018-07-23 LAB — CBC
HCT: 24.4 % — ABNORMAL LOW (ref 39.0–52.0)
HEMOGLOBIN: 7.8 g/dL — AB (ref 13.0–17.0)
MCH: 29.8 pg (ref 26.0–34.0)
MCHC: 32 g/dL (ref 30.0–36.0)
MCV: 93.1 fL (ref 80.0–100.0)
Platelets: 148 10*3/uL — ABNORMAL LOW (ref 150–400)
RBC: 2.62 MIL/uL — ABNORMAL LOW (ref 4.22–5.81)
RDW: 13.5 % (ref 11.5–15.5)
WBC: 4.1 10*3/uL (ref 4.0–10.5)
nRBC: 0 % (ref 0.0–0.2)

## 2018-07-23 LAB — GLUCOSE, CAPILLARY
Glucose-Capillary: 106 mg/dL — ABNORMAL HIGH (ref 70–99)
Glucose-Capillary: 134 mg/dL — ABNORMAL HIGH (ref 70–99)
Glucose-Capillary: 142 mg/dL — ABNORMAL HIGH (ref 70–99)
Glucose-Capillary: 128 mg/dL — ABNORMAL HIGH (ref 70–99)

## 2018-07-23 LAB — IRON AND TIBC
Iron: 33 ug/dL — ABNORMAL LOW (ref 45–182)
Saturation Ratios: 15 % — ABNORMAL LOW (ref 17.9–39.5)
TIBC: 227 ug/dL — ABNORMAL LOW (ref 250–450)
UIBC: 194 ug/dL

## 2018-07-23 LAB — HEMOGLOBIN A1C
Hgb A1c MFr Bld: 5.4 % (ref 4.8–5.6)
Mean Plasma Glucose: 108.28 mg/dL

## 2018-07-23 LAB — FERRITIN: Ferritin: 111 ng/mL (ref 24–336)

## 2018-07-23 MED ORDER — FUROSEMIDE 10 MG/ML IJ SOLN
60.0000 mg | Freq: Once | INTRAMUSCULAR | Status: AC
  Administered 2018-07-23: 60 mg via INTRAVENOUS
  Filled 2018-07-23: qty 6

## 2018-07-23 MED ORDER — DARBEPOETIN ALFA 100 MCG/0.5ML IJ SOSY
100.0000 ug | PREFILLED_SYRINGE | Freq: Once | INTRAMUSCULAR | Status: AC
  Administered 2018-07-23: 100 ug via SUBCUTANEOUS
  Filled 2018-07-23: qty 0.5

## 2018-07-23 MED ORDER — NITROGLYCERIN 0.4 MG SL SUBL
SUBLINGUAL_TABLET | SUBLINGUAL | Status: AC
  Administered 2018-07-23: 01:00:00
  Administered 2018-07-23: 0.4 mg
  Filled 2018-07-23: qty 1

## 2018-07-23 MED ORDER — MORPHINE SULFATE (PF) 2 MG/ML IV SOLN
2.0000 mg | Freq: Once | INTRAVENOUS | Status: AC
  Administered 2018-07-23: 2 mg via INTRAVENOUS
  Filled 2018-07-23: qty 1

## 2018-07-23 MED ORDER — INSULIN ASPART 100 UNIT/ML ~~LOC~~ SOLN
0.0000 [IU] | Freq: Three times a day (TID) | SUBCUTANEOUS | Status: DC
  Administered 2018-07-23 – 2018-07-25 (×7): 1 [IU] via SUBCUTANEOUS
  Administered 2018-07-26: 2 [IU] via SUBCUTANEOUS

## 2018-07-23 NOTE — Plan of Care (Signed)
  Problem: Nutrition: Goal: Adequate nutrition will be maintained Outcome: Progressing   Problem: Coping: Goal: Level of anxiety will decrease Outcome: Progressing   Problem: Elimination: Goal: Will not experience complications related to urinary retention Outcome: Progressing   Problem: Safety: Goal: Ability to remain free from injury will improve Outcome: Progressing   

## 2018-07-23 NOTE — Progress Notes (Signed)
[facility]                                                                              Patient Demographics  [name] [name], is a 84 y.o. male, DOB - 06/21/1929, WER:[id number]  Admit date - 07/22/2018   Admitting Physician Shela Leff, MD  Outpatient Primary MD for the patient is System, Pcp Not In  Outpatient specialists:   LOS - 1  days   Medical records reviewed and are as summarized below:    Chief Complaint  Patient presents with  . Chest Pain       Brief summary   Patient is 84 year old male with CHF, CKD stage IV, diabetes type 2, hypertension, hyperlipidemia, CVA presented with shortness of breath.  Patient reported fatigue, DOE, PND, orthopnea, worsening lower extremity edema for the past few days.  He had been taking previously Lasix 120 mg daily but was reduced to 80 mg daily 3 weeks ago due to worsening kidney function.  He is followed by nephrology at [facility] and has not been started on dialysis yet, does not have permanent access. He was admitted for further work-up  Assessment & Plan    Principal Problem:   Volume overload in the setting of acute on chronic diastolic CHF, CKD stage V not started on dialysis yet -Presented with worsening fatigue, DOE, orthopnea, PND, Lasix dose reduced, BNP 2271 basilar Rales, pedal edema -Nephrology consulted, patient started on IV Lasix, received 60 mg IV x1 today, feeling slightly better today -Currently responding to Lasix, might be able to hold off on dialysis - will follow nephrology recommendations regarding IV diuresis   Active Problems: Pancytopenia with anemia and thrombocytopenia in the setting of CKD stage V, anemia of chronic disease - no sign of active bleeding - will give 1 dose of Aranesp 100 MCG x1 -Nephrology consulted    Essential hypertension -BP currently stable, continue labetalol, Imdur, clonidine    Asthma -Currently stable, continue albuterol nebs as needed   BPH (benign prostatic hyperplasia) -Continue Flomax    DM (diabetes mellitus), type 2 with renal complications (HCC) -Follow hemoglobin A1c, placed on sliding scale insulin sensitive    Dyslipidemia -Continue pravastatin     Gout -Continue allopurinol  History of CVA (cerebral vascular accident) ([place]) -No new FND's, continue aspirin, statin   Code Status: Full code DVT Prophylaxis: Heparin subcu Family Communication: Discussed in detail with the patient, all imaging results, lab results explained to the patient    Disposition Plan: Will need to stay inpatient, high risk of deterioration with volume overload, respiratory status and determining need for dialysis  Time Spent in minutes   35 minutes  Procedures:  None  Consultants:   Nephrology  Antimicrobials:      Medications  Scheduled Meds: . allopurinol  100 mg Oral Daily  . allopurinol  50 mg Oral Daily  . aspirin EC  81 mg Oral QHS  . calcitRIOL  1 mcg Oral Q M,W,F  . cloNIDine  0.15 mg Oral BID  . fluticasone  1 spray Each Nare BID  . heparin  5,000 Units Subcutaneous Q8H  . isosorbide mononitrate  60 mg Oral Daily  . labetalol  400 mg Oral BID  . latanoprost  1 drop Left Eye QHS  . montelukast  10 mg Oral QHS  . pravastatin  40 mg Oral Daily  . tamsulosin  0.4 mg Oral Q1400   Continuous Infusions: PRN Meds:.albuterol, polyethylene glycol   Antibiotics   Anti-infectives (From admission, onward)   None        Subjective:   [name] [name] was seen and examined today.  States breathing is a little better from the time of admission due to Lasix.  Still has pedal edema has shortness of breath with exertion.  No chest pain.  Patient denies dizziness, abdominal pain, N/V/D/C, new weakness, numbess, tingling.  Objective:   Vitals:   07/23/18 0115 07/23/18 0557 07/23/18 0804 07/23/18 0807  BP:  (!) 160/76 (!) 182/97 (!) 169/87  Pulse:  68 72 70  Resp:  18    Temp:  98.6 F (37 C) 98.5 F  (36.9 C)   TempSrc:  Oral Oral   SpO2:  98% 100%   Weight: 91.5 kg     Height:        Intake/Output Summary (Last 24 hours) at 07/23/2018 1105 Last data filed at 07/23/2018 0600 Gross per 24 hour  Intake 480 ml  Output 1450 ml  Net -970 ml     Wt Readings from Last 3 Encounters:  07/23/18 91.5 kg  10/29/17 94.8 kg  09/24/14 116.1 kg     Exam  General: Alert and oriented x 3, NAD  Eyes:   HEENT:  Atraumatic, normocephalic,   Cardiovascular: S1 S2 auscultated,  Regular rate and rhythm.  Trace to 1+ edema LE B/L  Respiratory: Bibasilar Rales  Gastrointestinal: Soft, nontender, nondistended, + bowel sounds  Ext: Trace pedal edema bilaterally  Neuro: No new deficits  Musculoskeletal: No digital cyanosis, clubbing  Skin: No rashes  Psych: Normal affect and demeanor, alert and oriented x3    Data Reviewed:  I have personally reviewed following labs and imaging studies  Micro Results No results found for this or any previous visit (from the past 240 hour(s)).  Radiology Reports Dg Chest 2 View  Result Date: 07/22/2018 CLINICAL DATA:  Chest pain, intermittent for 2 weeks. EXAM: CHEST - 2 VIEW COMPARISON:  Chest x-rays dated 10/25/2017 and 01/17/2015. FINDINGS: Cardiomegaly, stable compared to the most recent chest x-ray of 10/25/2017. Atherosclerotic changes noted at the aortic arch. Prominent pulmonary artery suggesting chronic pulmonary artery hypertension. Increased interstitial markings bilaterally, most confluent at the RIGHT lung base. No pleural effusion or pneumothorax seen. No acute or suspicious osseous finding. IMPRESSION: 1. Increased interstitial markings bilaterally, most confluent at the RIGHT lung base. This could represent edema or pneumonia, likely superimposed on some degree of chronic interstitial lung disease. 2. Stable cardiomegaly. 3. Prominent pulmonary arteries suggesting chronic pulmonary artery hypertension. 4. Aortic atherosclerosis.  Electronically Signed   By: Franki Cabot M.D.   On: 07/22/2018 17:44    Lab Data:  CBC: Recent Labs  Lab 07/22/18 1538 07/23/18 0415  WBC 3.8* 4.1  HGB 7.9* 7.8*  HCT 25.8* 24.4*  MCV 96.3 93.1  PLT 146* 382*   Basic Metabolic Panel: Recent Labs  Lab 07/22/18 1538 07/23/18 0415  NA 136 135  K 4.2 3.9  CL 100 99  CO2 21* 20*  GLUCOSE 133* 150*  BUN 83* 81*  CREATININE 4.59* 4.43*  CALCIUM 9.5 9.4   GFR: Estimated Creatinine Clearance: 13.3 mL/min (A) (by C-G formula  based on SCr of 4.43 mg/dL (H)). Liver Function Tests: No results for input(s): AST, ALT, ALKPHOS, BILITOT, PROT, ALBUMIN in the last 168 hours. No results for input(s): LIPASE, AMYLASE in the last 168 hours. No results for input(s): AMMONIA in the last 168 hours. Coagulation Profile: No results for input(s): INR, PROTIME in the last 168 hours. Cardiac Enzymes: Recent Labs  Lab 07/22/18 2207 07/23/18 0415  TROPONINI 0.06* 0.06*   BNP (last 3 results) No results for input(s): PROBNP in the last 8760 hours. HbA1C: No results for input(s): HGBA1C in the last 72 hours. CBG: Recent Labs  Lab 07/22/18 2124 07/23/18 0733  GLUCAP 127* 106*   Lipid Profile: No results for input(s): CHOL, HDL, LDLCALC, TRIG, CHOLHDL, LDLDIRECT in the last 72 hours. Thyroid Function Tests: No results for input(s): TSH, T4TOTAL, FREET4, T3FREE, THYROIDAB in the last 72 hours. Anemia Panel: Recent Labs    07/23/18 0415  FERRITIN 111  TIBC 227*  IRON 33*   Urine analysis:    Component Value Date/Time   COLORURINE STRAW (A) 10/25/2017 1632   APPEARANCEUR CLEAR 10/25/2017 1632   LABSPEC 1.008 10/25/2017 1632   PHURINE 6.0 10/25/2017 1632   GLUCOSEU NEGATIVE 10/25/2017 1632   HGBUR NEGATIVE 10/25/2017 1632   BILIRUBINUR NEGATIVE 10/25/2017 1632   KETONESUR NEGATIVE 10/25/2017 1632   PROTEINUR 30 (A) 10/25/2017 1632   UROBILINOGEN 1.0 09/21/2014 0035   NITRITE NEGATIVE 10/25/2017 1632   LEUKOCYTESUR NEGATIVE  10/25/2017 1632     Nyzier Boivin M.D. [facility] 07/23/2018, 11:05 AM  Pager: [phone] Between 7am to 7pm - call Pager - [phone]  After 7pm go to [email or web address] - password [email or web address]  Call night coverage person covering after 7pm

## 2018-07-23 NOTE — Consult Note (Signed)
[facility] Renal Consultation Note  Requesting MD:  Dr. 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  Nephrology is consulted for assistance is managing volume overload and CKD.   He follows with [facility] for all of his care including nephrology.  Nephrologist is Dr. Randie Heinz. His creatinine of late has been in the high 4s - 5 range.  Last visit was 10/17 and he's having monthly labs.  Attempts have been made to minimize his diuretics in light of worsening creatinine.  Most recently a few weeks ago his lasix was decreased from 120mg  daily  to 80mg  daily.  He developed increasing dyspnea, orthopnea, cough productive of clear sputum.  No fevers, chills, sore throat, rhinorrhea.  On presentation proBNP ~2200 (during admission with similar presentation in 10/2017 proBNP ~1700; responded to diuresis).  CXR with increased interstitial marking - PNA versus edema.   Prior discussions regarding dialysis have been mixed per chart review.  He does not currently have an AV access in place.   He lives with his daughter and requires some assistance with IADLs.  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     Creatinine, Ser  Date/Time Value Ref Range Status  07/23/2018 04:15 AM 4.43 (H) 0.61 - 1.24 mg/dL Final  07/22/2018 03:38 PM 4.59 (H) 0.61 - 1.24 mg/dL Final  10/29/2017 05:03 AM 4.00 (H) 0.61 - 1.24 mg/dL Final  10/28/2017 04:25 AM 3.92 (H) 0.61 - 1.24 mg/dL Final  10/27/2017 05:32 AM 3.83 (H) 0.61 - 1.24 mg/dL Final  10/26/2017 01:46 AM 3.78 (H) 0.61 - 1.24 mg/dL Final  10/25/2017 02:19 PM 3.90 (H) 0.61 - 1.24 mg/dL Final  12/04/2016 09:54 AM 3.70 (H) 0.61 - 1.24  mg/dL Final  12/04/2016 09:28 AM 3.31 (H) 0.61 - 1.24 mg/dL Final  01/17/2015 12:00 PM 3.70 (H) 0.61 - 1.24 mg/dL Final  09/24/2014 03:43 AM 3.55 (H) 0.50 - 1.35 mg/dL Final  09/23/2014 04:13 AM 3.85 (H) 0.50 - 1.35 mg/dL Final  09/22/2014 04:50 AM 3.63 (H) 0.50 - 1.35 mg/dL Final  09/21/2014 02:19 AM 3.54 (H) 0.50 - 1.35 mg/dL Final  09/20/2014 10:24 PM 3.60 (H) 0.50 - 1.35 mg/dL Final  03/02/2010 08:05 AM 2.27 (H) 0.4 - 1.5 mg/dL Final  03/01/2010 06:55 AM 2.61 (H) 0.4 - 1.5 mg/dL Final  02/28/2010 05:00 AM 2.89 (H) 0.4 - 1.5 mg/dL Final  02/27/2010 04:55 AM 2.44 (H) 0.4 - 1.5 mg/dL Final  02/16/2010 11:00 AM 2.01 (H) 0.4 - 1.5 mg/dL Final  07/04/2008 12:58 PM 1.7 (H) 0.4 - 1.5 mg/dL Final  03/24/2007 05:05 AM 1.30  Final  03/23/2007 08:14 PM 1.5  Final     PMHx:   Past Medical History:  Diagnosis Date  . CHF (congestive heart failure) ([place])   . CKD (chronic kidney disease), stage IV ([place]) 10/25/2017  . Diabetes mellitus without complication ([place])   . Hyperlipidemia   . Hypertension   . Stroke [facility])    2009    Past Surgical History:  Procedure Laterality Date  . NECK SURGERY     lump removed  . REPLACEMENT TOTAL KNEE     Left and Right    Family Hx:  Family History  Problem Relation  Age of Onset  . Diabetes Brother     Social History:  reports that he has quit smoking. He has never used smokeless tobacco. He reports that he does not drink alcohol or use drugs.  Allergies:  Allergies  Allergen Reactions  . Flu Virus Vaccine Anaphylaxis    FLU VACCINATION  . Fosinopril Sodium Other (See Comments)    Unknown   . Hydrocodone-Acetaminophen     Other reaction(s): GI Upset (intolerance)  . Meloxicam     REACTION: stomah pains, nausea, constipation  . Valsartan Other (See Comments)    Other reaction(s): Other (See Comments) DIOVAN, UNKNOWN - takes losartan at home unknown    Medications: Prior to Admission medications   Medication Sig Start Date End Date  Taking? Authorizing Provider  acetaminophen (TYLENOL) 500 MG tablet Take 1,000 mg by mouth every 6 (six) hours as needed.   Yes [provider]  albuterol (PROVENTIL HFA;VENTOLIN HFA) 108 (90 Base) MCG/ACT inhaler Inhale 2 puffs into the lungs 2 (two) times daily. 05/21/18  Yes [provider]  allopurinol (ZYLOPRIM) 100 MG tablet Take 50-100 mg by mouth See admin instructions. Take 100 mg at 10:00 am and take 50 mg at 1800 05/15/18  Yes [provider]  amLODipine (NORVASC) 10 MG tablet Take 10 mg by mouth at bedtime.    Yes [provider]  aspirin EC 81 MG tablet Take 81 mg by mouth at bedtime.    Yes [provider]  augmented betamethasone dipropionate (DIPROLENE-AF) 0.05 % ointment Apply 1 application topically 2 (two) times daily. On the back of his head 09/23/17  Yes [provider]  calcitRIOL (ROCALTROL) 0.5 MCG capsule Take 1 mcg by mouth every Monday, Wednesday, and Friday. 06/23/18  Yes [provider]  cloNIDine (CATAPRES) 0.3 MG tablet Take 1 tablet (0.3 mg total) by mouth 3 (three) times daily. Patient taking differently: Take 0.015 mg by mouth 2 (two) times daily.  10/29/17  Yes Nita Sells, MD  fluticasone (FLONASE) 50 MCG/ACT nasal spray Place 1 spray into both nostrils 2 (two) times daily.   Yes [provider]  furosemide (LASIX) 40 MG tablet Take 80 mg by mouth daily.    Yes [provider]  hydrALAZINE (APRESOLINE) 100 MG tablet Take 100 mg by mouth 3 (three) times daily. Must take takes at 0900, 1300, and 2200 to prevent drop in the blood pressure   Yes [provider]  isosorbide mononitrate (IMDUR) 30 MG 24 hr tablet Take 1 tablet (30 mg total) by mouth daily. Patient taking differently: Take 60 mg by mouth daily.  09/24/14  Yes Tat, Shanon Brow, MD  labetalol (NORMODYNE) 200 MG tablet Take 400 mg by mouth 2 (two) times daily.    Yes [provider]  latanoprost (XALATAN) 0.005 %  ophthalmic solution Place 1 drop into the left eye at bedtime.   Yes [provider]  montelukast (SINGULAIR) 10 MG tablet Take 10 mg by mouth at bedtime.  10/09/17  Yes [provider]  Polyethyl Glycol-Propyl Glycol 0.4-0.3 % SOLN Place 1 drop into the right eye at bedtime.   Yes [provider]  polyethylene glycol (MIRALAX / GLYCOLAX) packet Take 17 g by mouth as needed. 05/01/18  Yes [provider]  pravastatin (PRAVACHOL) 40 MG tablet Take 40 mg by mouth daily.   Yes [provider]  tamsulosin (FLOMAX) 0.4 MG CAPS capsule Take 0.4 mg by mouth See admin instructions. Take at 1400   Yes [provider]  traMADol (ULTRAM) 50 MG tablet Take 50 mg by mouth every 6 (six) hours as needed for moderate pain.   Yes [provider]  cloNIDine (CATAPRES) 0.2 MG tablet Take 1 tablet (0.2 mg total) by mouth 3 (three) times daily. Patient not taking: Reported on 07/22/2018 09/24/14   Orson Eva, MD  predniSONE (DELTASONE) 50 MG tablet Take 1 tablet for 7 days then stop Patient not taking: Reported on 07/22/2018 10/29/17   Nita Sells, MD    I have reviewed the patient's current medications.  Labs:  Results for orders placed or performed during the hospital encounter of 07/22/18 (from the past 48 hour(s))  Basic metabolic panel     Status: Abnormal   Collection Time: 07/22/18  3:38 PM  Result Value Ref Range   Sodium 136 135 - 145 mmol/L   Potassium 4.2 3.5 - 5.1 mmol/L   Chloride 100 98 - 111 mmol/L   CO2 21 (L) 22 - 32 mmol/L   Glucose, Bld 133 (H) 70 - 99 mg/dL   BUN 83 (H) 8 - 23 mg/dL   Creatinine, Ser 4.59 (H) 0.61 - 1.24 mg/dL   Calcium 9.5 8.9 - 10.3 mg/dL   GFR calc non Af Amer 11 (L) >60 mL/min   GFR calc Af Amer 12 (L) >60 mL/min   Anion gap 15 5 - 15    Comment: Performed at [facility], [address] [place], [place] [address]  CBC     Status: Abnormal   Collection Time: 07/22/18  3:38 PM  Result Value Ref  Range   WBC 3.8 (L) 4.0 - 10.5 K/uL   RBC 2.68 (L) 4.22 - 5.81 MIL/uL   Hemoglobin 7.9 (L) 13.0 - 17.0 g/dL   HCT 25.8 (L) 39.0 - 52.0 %   MCV 96.3 80.0 - 100.0 fL   MCH 29.5 26.0 - 34.0 pg   MCHC 30.6 30.0 - 36.0 g/dL   RDW 13.7 11.5 - 15.5 %   Platelets 146 (L) 150 - 400 K/uL   nRBC 0.0 0.0 - 0.2 %    Comment: Performed at [facility], [place] [address] [place], [place] [address]  I-stat troponin, ED     Status: None   Collection Time: 07/22/18  3:46 PM  Result Value Ref Range   Troponin i, poc 0.06 0.00 - 0.08 ng/mL   Comment 3            Comment: Due to the release kinetics of cTnI, a negative result within the first hours of the onset of symptoms does not rule out myocardial infarction with certainty. If myocardial infarction is still suspected, repeat the test at appropriate intervals.   Brain natriuretic peptide     Status: Abnormal   Collection Time: 07/22/18  4:27 PM  Result Value Ref Range   B Natriuretic Peptide 2,271.6 (H) 0.0 - 100.0 pg/mL    Comment: Performed at [facility] [address] [place], [place] [address]  Glucose, capillary     Status: Abnormal   Collection Time: 07/22/18  9:24 PM  Result Value Ref Range   Glucose-Capillary 127 (H) 70 - 99 mg/dL  Troponin I - Now Then Q6H     Status: Abnormal   Collection Time: 07/22/18 10:07 PM  Result Value Ref Range   Troponin I 0.06 (HH) <0.03 ng/mL    Comment: CRITICAL RESULT CALLED TO, READ BACK BY AND VERIFIED WITH: A.ROBERTS,RN 2339 07/22/18 M.CAMPBELL Performed  at [facility], [place] [address], [place], [place] [address]   Procalcitonin - Baseline     Status: None   Collection Time: 07/22/18 10:07 PM  Result Value Ref Range   Procalcitonin <0.10 ng/mL    Comment:        Interpretation: PCT (Procalcitonin) <= 0.5 ng/mL: Systemic infection (sepsis) is not likely. Local bacterial infection is possible. (NOTE)       Sepsis PCT Algorithm           Lower Respiratory Tract                                       Infection PCT Algorithm    ----------------------------     ----------------------------         PCT < 0.25 ng/mL                PCT < 0.10 ng/mL         Strongly encourage             Strongly discourage   discontinuation of antibiotics    initiation of antibiotics    ----------------------------     -----------------------------       PCT 0.25 - 0.50 ng/mL            PCT 0.10 - 0.25 ng/mL               OR       >80% decrease in PCT            Discourage initiation of                                            antibiotics      Encourage discontinuation           of antibiotics    ----------------------------     -----------------------------         PCT >= 0.50 ng/mL              PCT 0.26 - 0.50 ng/mL               AND        <80% decrease in PCT             Encourage initiation of                                             antibiotics       Encourage continuation           of antibiotics    ----------------------------     -----------------------------        PCT >= 0.50 ng/mL                  PCT > 0.50 ng/mL               AND         increase in PCT                  Strongly encourage  initiation of antibiotics    Strongly encourage escalation           of antibiotics                                     -----------------------------                                           PCT <= 0.25 ng/mL                                                 OR                                        > 80% decrease in PCT                                     Discontinue / Do not initiate                                             antibiotics Performed at [facility], [address], [place], [place] [address]   Basic metabolic panel     Status: Abnormal   Collection Time: 07/23/18  4:15 AM  Result Value Ref Range   Sodium 135 135 - 145 mmol/L   Potassium 3.9 3.5 - 5.1 mmol/L   Chloride 99 98 - 111 mmol/L   CO2 20 (L) 22 - 32  mmol/L   Glucose, Bld 150 (H) 70 - 99 mg/dL   BUN 81 (H) 8 - 23 mg/dL   Creatinine, Ser 4.43 (H) 0.61 - 1.24 mg/dL   Calcium 9.4 8.9 - 10.3 mg/dL   GFR calc non Af Amer 11 (L) >60 mL/min   GFR calc Af Amer 13 (L) >60 mL/min   Anion gap 16 (H) 5 - 15    Comment: Performed at [facility], [place] [address], [place], [place] [address]  CBC     Status: Abnormal   Collection Time: 07/23/18  4:15 AM  Result Value Ref Range   WBC 4.1 4.0 - 10.5 K/uL   RBC 2.62 (L) 4.22 - 5.81 MIL/uL   Hemoglobin 7.8 (L) 13.0 - 17.0 g/dL   HCT 24.4 (L) 39.0 - 52.0 %   MCV 93.1 80.0 - 100.0 fL   MCH 29.8 26.0 - 34.0 pg   MCHC 32.0 30.0 - 36.0 g/dL   RDW 13.5 11.5 - 15.5 %   Platelets 148 (L) 150 - 400 K/uL   nRBC 0.0 0.0 - 0.2 %    Comment: Performed at [facility], [place] [address], [place], [place] [address]  Troponin I - Now Then Q6H     Status: Abnormal   Collection Time: 07/23/18  4:15 AM  Result Value Ref Range   Troponin I 0.06 (HH) <0.03 ng/mL    Comment: CRITICAL VALUE  NOTED.  VALUE IS CONSISTENT WITH PREVIOUSLY REPORTED AND CALLED VALUE. Performed at [facility], [place] [address] [place], [place] [address]   Iron and TIBC     Status: Abnormal   Collection Time: 07/23/18  4:15 AM  Result Value Ref Range   Iron 33 (L) 45 - 182 ug/dL   TIBC 227 (L) 250 - 450 ug/dL   Saturation Ratios 15 (L) 17.9 - 39.5 %   UIBC 194 ug/dL    Comment: Performed at [facility], [place] [address] [place], [place] [address]  Ferritin     Status: None   Collection Time: 07/23/18  4:15 AM  Result Value Ref Range   Ferritin 111 24 - 336 ng/mL    Comment: Performed at [facility] [address], [place], [place] [address]     ROS:  Pertinent items are noted in HPI.  Physical Exam: Vitals:   07/23/18 0053 07/23/18 0557  BP: 131/66 (!) 160/76  Pulse: 72 68  Resp:  18  Temp:  98.6 F (37 C)  SpO2:  98%     General: comfortably lying 30 degrees in bed HEENT: MMM Eyes:  anicteric sclera Neck: JVD to 10cm at 30 degrees Heart: RRR, no rub Lungs: rales in bases Abdomen: soft, nontender Extremities: no edema Skin: warm and dry Neuro: tremor noted mostly in R hand at rest; conversant  Assessment/Plan: **Dyspnea:  I suspect a large component of his presentation is pulmonary edema.  Recommended lasix 60 IV last PM.  Diuresed 1460mL and is feeling improved but not back to baseline.  Redose lasix 60 IV now.   Continue to follow - hopefully will be able to be medically managed, see below related to CKD.   **CKD 5: Renal function stable compared to recent outpatient values.  No uremic symptoms currently and no clear indications for dialysis as he is responding to diuretics.  Will be an ongoing discussion regarding dialysis.  His advanced age and frail status make him a suboptimal candidate but it can be discussed.   **Anemia, pancytopenia:   has known MDS and follows with oncology at [facility], last visit 09/2017.  Hemoglobin generally running in the mid 8s to 9, 8.6 on 10/17.  Admitting Hb is 7.6; plt and WBC at baseline.  Iron sat low at 15%, ferritin 111.  Would likely benefit from IV iron administration this admission.  Oncology at [facility] considered ESA administration if needed.  Has not required to date.   **Secondary hyperparathyroidism:  iPTH 277 01/2018. Continue home dose calcitriol.   **HTN:  Overnight BP 130/60, this AM 160/76.  On home medications. Monitor with diuresis.  Normotensive at last outpt nephrology visit.   Please call with questions or concerns.   [name] Mend 07/23/2018, 7:10 AM

## 2018-07-23 NOTE — Evaluation (Signed)
Physical Therapy Evaluation Patient Details Name: [name] [name] MRN: [id number] DOB: [birth year] Today's Date: 07/23/2018   History of Present Illness  Patient is a 84 y/o male who presents with SOB, chest pain and LE swelling. BNP>2200. CXR-volume overload with superimposed PNA. Found to have Volume overload in the setting of acute on chronic diastolic CHF, CKD stage V not started on dialysis yet. PMH includes chronic diastolic CHF, HTN, DM, HLD, OSA not on CPAP, stage V CKD, CVA with residual LUE weakness, tremors, low grade MDS.  Clinical Impression  Patient presents with dyspnea on exertion, impaired balance, decreased activity tolerance and impaired mobility s/p above. Pt lives with daughter and uses RW for mobility. Today, pt tolerated transfers and gait training with Min guard-Min A for balance/safety. 2/4 DOE. HR stable. Some unsteadiness noted with head turns. Education re: how to use rollator brakes/seat and pt had difficulty time locking/unlocking rollator brakes. Will continue to practice as pt interested in one for home. Will follow acutely to maximize independence and mobility prior to return home.     Follow Up Recommendations Home health PT;Supervision for mobility/OOB    Equipment Recommendations  None recommended by PT    Recommendations for Other Services       Precautions / Restrictions Precautions Precautions: Fall Restrictions Weight Bearing Restrictions: No      Mobility  Bed Mobility               General bed mobility comments: Sitting EOB upon PT arrival.  Transfers Overall transfer level: Needs assistance Equipment used: 4-wheeled walker Transfers: Sit to/from Stand Sit to Stand: Min assist         General transfer comment: Assist to power to standing with use of momentum. Stood from [employer], from rollator seat x1. Has a high bed at home  Ambulation/Gait Ambulation/Gait assistance: Min guard Gait Distance (Feet): 80 Feet(x2  bouts) Assistive device: 4-wheeled walker Gait Pattern/deviations: Step-through pattern;Decreased stride length;Trunk flexed Gait velocity: decreased   General Gait Details: Slow, mildly unsteady gait with cues for upright posture; pt with unsteadiness with head turns. 2/4 DOE. HR stable. Difficulty activating rollator brakes despite education.  Stairs            Wheelchair Mobility    Modified Rankin (Stroke Patients Only)       Balance Overall balance assessment: Needs assistance;History of Falls Sitting-balance support: Feet supported;No upper extremity supported Sitting balance-Leahy Scale: Good     Standing balance support: During functional activity;Bilateral upper extremity supported Standing balance-Leahy Scale: Poor Standing balance comment: Reliant on BUEs for support in standing.                             Pertinent Vitals/Pain Pain Assessment: No/denies pain    Home Living Family/patient expects to be discharged to:: Private residence Living Arrangements: Children Available Help at Discharge: Family;Available 24 hours/day Type of Home: House Home Access: Stairs to enter;Ramped entrance Entrance Stairs-Rails: Right Entrance Stairs-Number of Steps: 5 Home Layout: One level Home Equipment: Walker - 2 wheels;Shower seat - built in      Prior Function Level of Independence: Independent with assistive device(s);Needs assistance   Gait / Transfers Assistance Needed: Uses RW for ambulation. Reports 1 fall sliding off bed.  ADL's / Homemaking Assistance Needed: daughter assisted with LB ADL        Hand Dominance   Dominant Hand: Right    Extremity/Trunk Assessment   Upper Extremity Assessment  Upper Extremity Assessment: Defer to OT evaluation    Lower Extremity Assessment Lower Extremity Assessment: Generalized weakness(Swelling present BLEs.)       Communication   Communication: No difficulties  Cognition Arousal/Alertness:  Awake/alert Behavior During Therapy: WFL for tasks assessed/performed Overall Cognitive Status: Within Functional Limits for tasks assessed                                 General Comments: for basic mobility tasks      General Comments      Exercises     Assessment/Plan    PT Assessment Patient needs continued PT services  PT Problem List Decreased strength;Decreased mobility;Cardiopulmonary status limiting activity;Decreased activity tolerance;Decreased balance;Decreased knowledge of use of DME       PT Treatment Interventions DME instruction;Functional mobility training;Balance training;Patient/family education;Gait training;Therapeutic activities;Therapeutic exercise    PT Goals (Current goals can be found in the Care Plan section)  Acute Rehab PT Goals Patient Stated Goal: to go home PT Goal Formulation: With patient Time For Goal Achievement: 08/06/18 Potential to Achieve Goals: Good    Frequency Min 3X/week   Barriers to discharge        Co-evaluation               AM-PAC PT "6 Clicks" Mobility  Outcome Measure Help needed turning from your back to your side while in a flat bed without using bedrails?: None Help needed moving from lying on your back to sitting on the side of a flat bed without using bedrails?: None Help needed moving to and from a bed to a chair (including a wheelchair)?: A Little Help needed standing up from a chair using your arms (e.g., wheelchair or bedside chair)?: A Little Help needed to walk in hospital room?: A Little Help needed climbing 3-5 steps with a railing? : A Lot 6 Click Score: 19    End of Session Equipment Utilized During Treatment: Gait belt Activity Tolerance: Patient tolerated treatment well Patient left: in chair;with call bell/phone within reach;with chair alarm set Nurse Communication: Mobility status PT Visit Diagnosis: Difficulty in walking, not elsewhere classified (R26.2);Unsteadiness on feet  (R26.81)    Time: 7824-2353 PT Time Calculation (min) (ACUTE ONLY): 20 min   Charges:   PT Evaluation $PT Eval Moderate Complexity: 1 Mod          Wray Kearns, PT, DPT Acute Rehabilitation Services Pager [phone] Office [phone]      Marguarite Arbour A Sabra Heck 07/23/2018, 12:02 PM

## 2018-07-23 NOTE — Progress Notes (Signed)
Pt called out with chest pain.  Chest pain protocol started.  VSS.  EKG complete.  Nitro x2 given.  [facility] on call gave order for 2 mg morphine.  After morphine given pt chest pain now a zero.

## 2018-07-24 ENCOUNTER — Inpatient Hospital Stay (HOSPITAL_COMMUNITY): Payer: Medicare Other

## 2018-07-24 DIAGNOSIS — I340 Nonrheumatic mitral (valve) insufficiency: Secondary | ICD-10-CM

## 2018-07-24 DIAGNOSIS — I351 Nonrheumatic aortic (valve) insufficiency: Secondary | ICD-10-CM

## 2018-07-24 DIAGNOSIS — D638 Anemia in other chronic diseases classified elsewhere: Secondary | ICD-10-CM

## 2018-07-24 LAB — CBC
HCT: 24.3 % — ABNORMAL LOW (ref 39.0–52.0)
Hemoglobin: 7.9 g/dL — ABNORMAL LOW (ref 13.0–17.0)
MCH: 30.3 pg (ref 26.0–34.0)
MCHC: 32.5 g/dL (ref 30.0–36.0)
MCV: 93.1 fL (ref 80.0–100.0)
Platelets: 139 10*3/uL — ABNORMAL LOW (ref 150–400)
RBC: 2.61 MIL/uL — ABNORMAL LOW (ref 4.22–5.81)
RDW: 13.2 % (ref 11.5–15.5)
WBC: 3.8 10*3/uL — ABNORMAL LOW (ref 4.0–10.5)
nRBC: 0 % (ref 0.0–0.2)

## 2018-07-24 LAB — GLUCOSE, CAPILLARY
Glucose-Capillary: 121 mg/dL — ABNORMAL HIGH (ref 70–99)
Glucose-Capillary: 126 mg/dL — ABNORMAL HIGH (ref 70–99)
Glucose-Capillary: 138 mg/dL — ABNORMAL HIGH (ref 70–99)
Glucose-Capillary: 141 mg/dL — ABNORMAL HIGH (ref 70–99)

## 2018-07-24 LAB — BASIC METABOLIC PANEL
Anion gap: 13 (ref 5–15)
BUN: 80 mg/dL — ABNORMAL HIGH (ref 8–23)
CALCIUM: 9.1 mg/dL (ref 8.9–10.3)
CO2: 21 mmol/L — AB (ref 22–32)
Chloride: 101 mmol/L (ref 98–111)
Creatinine, Ser: 4.61 mg/dL — ABNORMAL HIGH (ref 0.61–1.24)
GFR calc Af Amer: 12 mL/min — ABNORMAL LOW (ref >60)
GFR calc non Af Amer: 11 mL/min — ABNORMAL LOW (ref >60)
Glucose, Bld: 133 mg/dL — ABNORMAL HIGH (ref 70–99)
Potassium: 4 mmol/L (ref 3.5–5.1)
Sodium: 135 mmol/L (ref 135–145)

## 2018-07-24 LAB — ECHOCARDIOGRAM COMPLETE
Height: 73 in
WEIGHTICAEL: 3184 [oz_av]

## 2018-07-24 MED ORDER — SORBITOL 70 % SOLN
20.0000 mL | Freq: Once | Status: AC
  Administered 2018-07-24: 20 mL via ORAL
  Filled 2018-07-24: qty 30

## 2018-07-24 MED ORDER — SODIUM BICARBONATE 650 MG PO TABS
325.0000 mg | ORAL_TABLET | Freq: Every day | ORAL | Status: DC
  Administered 2018-07-24 – 2018-07-25 (×2): 325 mg via ORAL
  Filled 2018-07-24 (×2): qty 1

## 2018-07-24 MED ORDER — SODIUM CHLORIDE 0.9 % IV SOLN
510.0000 mg | Freq: Once | INTRAVENOUS | Status: AC
  Administered 2018-07-24: 510 mg via INTRAVENOUS
  Filled 2018-07-24: qty 17

## 2018-07-24 MED ORDER — FUROSEMIDE 80 MG PO TABS
80.0000 mg | ORAL_TABLET | Freq: Two times a day (BID) | ORAL | Status: DC
  Administered 2018-07-24 – 2018-07-26 (×5): 80 mg via ORAL
  Filled 2018-07-24 (×4): qty 1

## 2018-07-24 MED ORDER — DOCUSATE SODIUM 100 MG PO CAPS
100.0000 mg | ORAL_CAPSULE | Freq: Two times a day (BID) | ORAL | Status: DC
  Administered 2018-07-24 – 2018-07-26 (×5): 100 mg via ORAL
  Filled 2018-07-24 (×5): qty 1

## 2018-07-24 NOTE — Progress Notes (Signed)
  Echocardiogram 2D Echocardiogram has been performed.  [name] [name] 07/24/2018, 4:18 PM

## 2018-07-24 NOTE — Progress Notes (Signed)
[facility] Progress Note    Assessment/ Plan:   1. Dyspnea and volume overload- improved with IV diuresis- switching to PO diuretics today of Lasix 80 BID.    2.  CKD 5: Renal function stable compared to recent outpatient values.  No uremic symptoms currently and no clear indications for dialysis as he is responding to diuretics.  Will be an ongoing discussion regarding dialysis.  His advanced age and frail status make him a suboptimal candidate but it can be discussed.   3.  Anemia, pancytopenia:   has known MDS and follows with oncology at [facility], last visit 09/2017.  Hemoglobin generally running in the mid 8s to 9, 8.6 on 10/17.  Admitting Hb is 7.6; plt and WBC at baseline.  Iron sat low at 15%, ferritin 111.  Would likely benefit from IV iron administration this admission- have ordered a dose of feraheme.  Oncology at [facility] considered ESA administration if needed.  Has not required to date.   4.  Secondary hyperparathyroidism:  iPTH 277 01/2018. Continue home dose calcitriol.   5.  HTN:    On home medications. Monitor with diuresis.  Normotensive at last outpt nephrology visit.   Subjective:    Good diuresis overnight, nearing his optimal weight as reviewed in OP notes.  He is feeling well today and swelling markedly improved.     Objective:   BP (!) 166/72   Pulse 66   Temp 98.2 F (36.8 C) (Oral)   Resp 18   Ht 6\' 1"  (1.854 m)   Wt 90.3 kg   SpO2 98%   BMI 26.25 kg/m   Intake/Output Summary (Last 24 hours) at 07/24/2018 1422 Last data filed at 07/24/2018 1122 Gross per 24 hour  Intake 440 ml  Output 1350 ml  Net -910 ml   Weight change: -2.359 kg  Physical Exam: Gen: older gentleman, NAD CVS: RRR Resp: normal WOB, off O2 this AM, clear Abd: nontender and nondistended Ext: no LE edema  Imaging: Dg Chest 2 View  Result Date: 07/22/2018 CLINICAL DATA:  Chest pain, intermittent for 2 weeks. EXAM: CHEST - 2 VIEW COMPARISON:  Chest x-rays dated  10/25/2017 and 01/17/2015. FINDINGS: Cardiomegaly, stable compared to the most recent chest x-ray of 10/25/2017. Atherosclerotic changes noted at the aortic arch. Prominent pulmonary artery suggesting chronic pulmonary artery hypertension. Increased interstitial markings bilaterally, most confluent at the RIGHT lung base. No pleural effusion or pneumothorax seen. No acute or suspicious osseous finding. IMPRESSION: 1. Increased interstitial markings bilaterally, most confluent at the RIGHT lung base. This could represent edema or pneumonia, likely superimposed on some degree of chronic interstitial lung disease. 2. Stable cardiomegaly. 3. Prominent pulmonary arteries suggesting chronic pulmonary artery hypertension. 4. Aortic atherosclerosis. Electronically Signed   By: Franki Cabot M.D.   On: 07/22/2018 17:44    Labs: BMET Recent Labs  Lab 07/22/18 1538 07/23/18 0415 07/24/18 0423  NA 136 135 135  K 4.2 3.9 4.0  CL 100 99 101  CO2 21* 20* 21*  GLUCOSE 133* 150* 133*  BUN 83* 81* 80*  CREATININE 4.59* 4.43* 4.61*  CALCIUM 9.5 9.4 9.1   CBC Recent Labs  Lab 07/22/18 1538 07/23/18 0415 07/24/18 0423  WBC 3.8* 4.1 3.8*  HGB 7.9* 7.8* 7.9*  HCT 25.8* 24.4* 24.3*  MCV 96.3 93.1 93.1  PLT 146* 148* 139*    Medications:    . allopurinol  100 mg Oral Daily  . allopurinol  50 mg Oral Daily  .  aspirin EC  81 mg Oral QHS  . calcitRIOL  1 mcg Oral Q M,W,F  . cloNIDine  0.15 mg Oral BID  . docusate sodium  100 mg Oral BID  . fluticasone  1 spray Each Nare BID  . furosemide  80 mg Oral BID  . heparin  5,000 Units Subcutaneous Q8H  . insulin aspart  0-9 Units Subcutaneous TID WC  . isosorbide mononitrate  60 mg Oral Daily  . labetalol  400 mg Oral BID  . latanoprost  1 drop Left Eye QHS  . montelukast  10 mg Oral QHS  . pravastatin  40 mg Oral Daily  . sodium bicarbonate  325 mg Oral QHS  . tamsulosin  0.4 mg Oral Q1400      Madelon Lips MD 07/24/2018, 2:22 PM

## 2018-07-24 NOTE — Care Management Note (Signed)
Case Management Note  Patient Details  Name: INGVALD THEISEN MRN: [id number] Date of Birth: 1929/07/14  Subjective/Objective:    Volume Overload               Action/Plan: Patient lives at home with his daughter; he requested that I talk to his daughter [name]; TCT daughter; PCP is Dr Oretha Ellis [phone]); has private insurance with Medicare; pharmacy of choice is [facility] mail order and [facility]; DME - walker, shower chair at home; he has scales at home and knows to weigh himself daily; His daughter cooks without salt; Patient could benefit from a Disease Management Program for CHF; [place] choice offered/ list with [facility] rating left in the patient's room and placed on the shadow chart; Daughter requested [facility]; Tiffany with [facility] called for arrangements; he is requesting a rollater ( rolling walker with seat) at discharge; CM will continue to follow for progression of care.  Expected Discharge Date:    possibly 07/28/2018              Expected Discharge Plan:  [place]  Discharge planning Services  CM Consult  Choice offered to:  Adult Children  HH Arranged:  RN, Disease Management, PT, OT, Nurse's Aide [place] Agency:  [facility] ([facility]  Status of Service:  In process, will continue to follow  Sherrilyn Rist [phone] 07/24/2018, 11:54 AM

## 2018-07-24 NOTE — Progress Notes (Signed)
[facility]                                                                              Patient Demographics  [name] [name], is a 84 y.o. male, DOB - 1928/09/14, YSA:[id number]  Admit date - 07/22/2018   Admitting Physician Shela Leff, MD  Outpatient Primary MD for the patient is System, Pcp Not In  Outpatient specialists:   LOS - 2  days   Medical records reviewed and are as summarized below:    Chief Complaint  Patient presents with  . Chest Pain       Brief summary   Patient is 84 year old male with CHF, CKD stage IV, diabetes type 2, hypertension, hyperlipidemia, CVA presented with shortness of breath.  Patient reported fatigue, DOE, PND, orthopnea, worsening lower extremity edema for the past few days.  He had been taking previously Lasix 120 mg daily but was reduced to 80 mg daily 3 weeks ago due to worsening kidney function.  He is followed by nephrology at [facility] and has not been started on dialysis yet, does not have permanent access. He was admitted for further work-up  Assessment & Plan    Principal Problem:   Volume overload in the setting of acute on chronic diastolic CHF, CKD stage V not started on dialysis yet -Presented with worsening fatigue, DOE, orthopnea, PND, Lasix dose reduced, BNP 2271 basilar Rales, pedal edema -Nephrology allowing, seen by Dr. Johnney Ou on 12/5, patient received Lasix 60 mg x 1, feels better, negative balance of 1.68 L - will follow nephrology recommendations regarding further IV diuresis, no indication for HD now -Last echo in 2016, EF 65 to 32%, grade 1 diastolic dysfunction, repeat echo pending  Active Problems: Pancytopenia with anemia and thrombocytopenia in the setting of CKD stage V, anemia of chronic disease - no sign of active bleeding - 1 dose of Aranesp 100 MCG x1 on 12/5, continue H&H monitoring -Nephrology following    Essential hypertension -BP somewhat elevated on labetalol, Imdur,  clonidine     Asthma -Currently stable, continue albuterol nebs as needed    BPH (benign prostatic hyperplasia) -Continue Flomax    DM (diabetes mellitus), type 2 with renal complications (HCC) -Hemoglobin A1c 5.4, continue sliding scale insulin    Dyslipidemia -Continue pravastatin     Gout -Continue allopurinol  History of CVA (cerebral vascular accident) ([name]) -No new FND's, continue aspirin, statin   Code Status: Full code DVT Prophylaxis: Heparin subcu Family Communication: Discussed in detail with the patient, all imaging results, lab results explained to the patient and daughter at the bedside   Disposition Plan: Awaiting nephrology recommendations regarding further IV diuresis, received Lasix IV 60 mg x 1 on 12/5  Time Spent in minutes   35 minutes  Procedures:  None  Consultants:   Nephrology  Antimicrobials:      Medications  Scheduled Meds: . allopurinol  100 mg Oral Daily  . allopurinol  50 mg Oral Daily  . aspirin EC  81 mg Oral QHS  . calcitRIOL  1 mcg Oral Q M,W,F  . cloNIDine  0.15 mg Oral BID  . docusate  sodium  100 mg Oral BID  . fluticasone  1 spray Each Nare BID  . furosemide  80 mg Oral BID  . heparin  5,000 Units Subcutaneous Q8H  . insulin aspart  0-9 Units Subcutaneous TID WC  . isosorbide mononitrate  60 mg Oral Daily  . labetalol  400 mg Oral BID  . latanoprost  1 drop Left Eye QHS  . montelukast  10 mg Oral QHS  . pravastatin  40 mg Oral Daily  . sodium bicarbonate  325 mg Oral QHS  . tamsulosin  0.4 mg Oral Q1400   Continuous Infusions: PRN Meds:.albuterol, polyethylene glycol   Antibiotics   Anti-infectives (From admission, onward)   None        Subjective:   [name] [name] was seen and examined today.  Sitting up in the chair, states breathing is better, on rest.  Still has pedal edema and exertional dyspnea, orthopnea.   No chest pain.  Patient denies dizziness, abdominal pain, N/V/D/C, new weakness,  numbess, tingling.  Objective:   Vitals:   07/23/18 2233 07/23/18 2234 07/24/18 0132 07/24/18 0415  BP: (!) 173/81  (!) 160/73 (!) 166/72  Pulse: 63 69 65 66  Resp:   18 18  Temp:   97.7 F (36.5 C) 98.2 F (36.8 C)  TempSrc:   Oral Oral  SpO2:   98% 98%  Weight:    90.3 kg  Height:        Intake/Output Summary (Last 24 hours) at 07/24/2018 1301 Last data filed at 07/24/2018 1122 Gross per 24 hour  Intake 640 ml  Output 1350 ml  Net -710 ml     Wt Readings from Last 3 Encounters:  07/24/18 90.3 kg  10/29/17 94.8 kg  09/24/14 116.1 kg   Physical Exam  General: Alert and oriented x 3, NAD  Eyes:   HEENT: JVD +  Cardiovascular: S1 S2 auscultated, 3/6 SEM, RRR, no pedal edema b/l  Respiratory: Bibasilar Rales  Gastrointestinal: Soft, nontender, nondistended, + bowel sounds  Ext: no pedal edema bilaterally  Neuro: No new deficits  Musculoskeletal: No digital cyanosis, clubbing  Skin: No rashes  Psych: Normal affect and demeanor, alert and oriented x3     Data Reviewed:  I have personally reviewed following labs and imaging studies  Micro Results No results found for this or any previous visit (from the past 240 hour(s)).  Radiology Reports Dg Chest 2 View  Result Date: 07/22/2018 CLINICAL DATA:  Chest pain, intermittent for 2 weeks. EXAM: CHEST - 2 VIEW COMPARISON:  Chest x-rays dated 10/25/2017 and 01/17/2015. FINDINGS: Cardiomegaly, stable compared to the most recent chest x-ray of 10/25/2017. Atherosclerotic changes noted at the aortic arch. Prominent pulmonary artery suggesting chronic pulmonary artery hypertension. Increased interstitial markings bilaterally, most confluent at the RIGHT lung base. No pleural effusion or pneumothorax seen. No acute or suspicious osseous finding. IMPRESSION: 1. Increased interstitial markings bilaterally, most confluent at the RIGHT lung base. This could represent edema or pneumonia, likely superimposed on some degree of  chronic interstitial lung disease. 2. Stable cardiomegaly. 3. Prominent pulmonary arteries suggesting chronic pulmonary artery hypertension. 4. Aortic atherosclerosis. Electronically Signed   By: Franki Cabot M.D.   On: 07/22/2018 17:44    Lab Data:  CBC: Recent Labs  Lab 07/22/18 1538 07/23/18 0415 07/24/18 0423  WBC 3.8* 4.1 3.8*  HGB 7.9* 7.8* 7.9*  HCT 25.8* 24.4* 24.3*  MCV 96.3 93.1 93.1  PLT 146* 148* 297*   Basic Metabolic Panel: Recent  Labs  Lab 07/22/18 1538 07/23/18 0415 07/24/18 0423  NA 136 135 135  K 4.2 3.9 4.0  CL 100 99 101  CO2 21* 20* 21*  GLUCOSE 133* 150* 133*  BUN 83* 81* 80*  CREATININE 4.59* 4.43* 4.61*  CALCIUM 9.5 9.4 9.1   GFR: Estimated Creatinine Clearance: 12.8 mL/min (A) (by C-G formula based on SCr of 4.61 mg/dL (H)). Liver Function Tests: No results for input(s): AST, ALT, ALKPHOS, BILITOT, PROT, ALBUMIN in the last 168 hours. No results for input(s): LIPASE, AMYLASE in the last 168 hours. No results for input(s): AMMONIA in the last 168 hours. Coagulation Profile: No results for input(s): INR, PROTIME in the last 168 hours. Cardiac Enzymes: Recent Labs  Lab 07/22/18 2207 07/23/18 0415 07/23/18 0956  TROPONINI 0.06* 0.06* 0.07*   BNP (last 3 results) No results for input(s): PROBNP in the last 8760 hours. HbA1C: Recent Labs    07/23/18 0415  HGBA1C 5.4   CBG: Recent Labs  Lab 07/23/18 1148 07/23/18 1627 07/23/18 2127 07/24/18 0719 07/24/18 1205  GLUCAP 134* 142* 128* 121* 126*   Lipid Profile: No results for input(s): CHOL, HDL, LDLCALC, TRIG, CHOLHDL, LDLDIRECT in the last 72 hours. Thyroid Function Tests: No results for input(s): TSH, T4TOTAL, FREET4, T3FREE, THYROIDAB in the last 72 hours. Anemia Panel: Recent Labs    07/23/18 0415  FERRITIN 111  TIBC 227*  IRON 33*   Urine analysis:    Component Value Date/Time   COLORURINE STRAW (A) 10/25/2017 1632   APPEARANCEUR CLEAR 10/25/2017 1632   LABSPEC  1.008 10/25/2017 1632   PHURINE 6.0 10/25/2017 1632   GLUCOSEU NEGATIVE 10/25/2017 1632   HGBUR NEGATIVE 10/25/2017 1632   BILIRUBINUR NEGATIVE 10/25/2017 1632   KETONESUR NEGATIVE 10/25/2017 1632   PROTEINUR 30 (A) 10/25/2017 1632   UROBILINOGEN 1.0 09/21/2014 0035   NITRITE NEGATIVE 10/25/2017 1632   LEUKOCYTESUR NEGATIVE 10/25/2017 1632     Shantel Wesely M.D. [facility] 07/24/2018, 1:01 PM  Pager: [phone] Between 7am to 7pm - call Pager - [phone]  After 7pm go to [email or web address] - password [email or web address]  Call night coverage person covering after 7pm

## 2018-07-24 NOTE — Progress Notes (Addendum)
Physical Therapy Treatment Patient Details Name: [name] [name] MRN: [id number] DOB: Jan 29, 1929 Today's Date: 07/24/2018    History of Present Illness Patient is a 84 y/o male who presents with SOB, chest pain and LE swelling. BNP>2200. CXR-volume overload with superimposed PNA. Found to have Volume overload in the setting of acute on chronic diastolic CHF, CKD stage V not started on dialysis yet. PMH includes chronic diastolic CHF, HTN, DM, HLD, OSA not on CPAP, stage V CKD, CVA with residual LUE weakness, tremors, low grade MDS.    PT Comments    Pt admitted with above diagnosis. Pt currently with functional limitations due to the deficits listed below (see PT Problem List). Pt was able to ambulate with rollator with min guard assist due to occasional steadying needed when right UE shaky due to fatigue.  States he will have daughter at home to assist.  Pt did better with brakes on rollator today.   Pt will benefit from skilled PT to increase their independence and safety with mobility to allow discharge to the venue listed below.     Follow Up Recommendations  Home health PT;Supervision for mobility/OOB     Equipment Recommendations  (rollator)    Recommendations for Other Services       Precautions / Restrictions Precautions Precautions: Fall Restrictions Weight Bearing Restrictions: No    Mobility  Bed Mobility Overal bed mobility: Needs Assistance Bed Mobility: Supine to Sit     Supine to sit: Min guard     General bed mobility comments: No assist needed to come to EOB, just supervision for safety.   Transfers Overall transfer level: Needs assistance Equipment used: 4-wheeled walker Transfers: Sit to/from Stand Sit to Stand: Min guard         General transfer comment: No assist to power to standing with pt using momentum. Stood from EOB and  from rollator seat. Has a high bed at home.  Pt was able to recall locking and unlocking brakes each time after showing  demonstration.    Ambulation/Gait Ambulation/Gait assistance: Min assist Gait Distance (Feet): 300 Feet(100 feet, 50 feet then 150 feet) Assistive device: 4-wheeled walker Gait Pattern/deviations: Step-through pattern;Decreased stride length;Trunk flexed;Drifts right/left Gait velocity: decreased Gait velocity interpretation: 1.31 - 2.62 ft/sec, indicative of limited community ambulator General Gait Details: Slow, mildly unsteady gait with cues for upright posture; pt with shakiness noted in right UE with fatigue and pt states this is premorbid after previous stroke.   2/4 DOE. HR stable. did better with rollator brakes today. Cannot accept challenges to balance. 3 seated rest breaks on rollator with ambulation.    Stairs             Wheelchair Mobility    Modified Rankin (Stroke Patients Only)       Balance Overall balance assessment: Needs assistance;History of Falls Sitting-balance support: Feet supported;No upper extremity supported Sitting balance-Leahy Scale: Good     Standing balance support: During functional activity;Bilateral upper extremity supported Standing balance-Leahy Scale: Poor Standing balance comment: Reliant on BUEs for support in standing.                            Cognition Arousal/Alertness: Awake/alert Behavior During Therapy: WFL for tasks assessed/performed Overall Cognitive Status: Within Functional Limits for tasks assessed  General Comments: for basic mobility tasks      Exercises      General Comments        Pertinent Vitals/Pain Pain Assessment: No/denies pain    Home Living                      Prior Function            PT Goals (current goals can now be found in the care plan section) Acute Rehab PT Goals Patient Stated Goal: to go home Progress towards PT goals: Progressing toward goals    Frequency    Min 3X/week      PT Plan Current plan  remains appropriate;Equipment recommendations need to be updated    Co-evaluation              AM-PAC PT "6 Clicks" Mobility   Outcome Measure  Help needed turning from your back to your side while in a flat bed without using bedrails?: None Help needed moving from lying on your back to sitting on the side of a flat bed without using bedrails?: None Help needed moving to and from a bed to a chair (including a wheelchair)?: A Little Help needed standing up from a chair using your arms (e.g., wheelchair or bedside chair)?: A Little Help needed to walk in hospital room?: A Little Help needed climbing 3-5 steps with a railing? : A Lot 6 Click Score: 19    End of Session Equipment Utilized During Treatment: Gait belt Activity Tolerance: Patient tolerated treatment well Patient left: in chair;with call bell/phone within reach;with chair alarm set Nurse Communication: Mobility status PT Visit Diagnosis: Difficulty in walking, not elsewhere classified (R26.2);Unsteadiness on feet (R26.81)     Time: 4742-5956 PT Time Calculation (min) (ACUTE ONLY): 19 min  Charges:  $Gait Training: 8-22 mins                     Arthur Pager:  [phone]  Office:  [place] 07/24/2018, 4:01 PM

## 2018-07-24 NOTE — Plan of Care (Signed)

## 2018-07-25 DIAGNOSIS — N400 Enlarged prostate without lower urinary tract symptoms: Secondary | ICD-10-CM

## 2018-07-25 LAB — BASIC METABOLIC PANEL
Anion gap: 15 (ref 5–15)
BUN: 82 mg/dL — AB (ref 8–23)
CO2: 21 mmol/L — ABNORMAL LOW (ref 22–32)
Calcium: 9.4 mg/dL (ref 8.9–10.3)
Chloride: 100 mmol/L (ref 98–111)
Creatinine, Ser: 4.43 mg/dL — ABNORMAL HIGH (ref 0.61–1.24)
GFR calc Af Amer: 13 mL/min — ABNORMAL LOW (ref >60)
GFR calc non Af Amer: 11 mL/min — ABNORMAL LOW (ref >60)
Glucose, Bld: 125 mg/dL — ABNORMAL HIGH (ref 70–99)
Potassium: 4 mmol/L (ref 3.5–5.1)
Sodium: 136 mmol/L (ref 135–145)

## 2018-07-25 LAB — CBC
HEMATOCRIT: 24.4 % — AB (ref 39.0–52.0)
Hemoglobin: 7.9 g/dL — ABNORMAL LOW (ref 13.0–17.0)
MCH: 29.9 pg (ref 26.0–34.0)
MCHC: 32.4 g/dL (ref 30.0–36.0)
MCV: 92.4 fL (ref 80.0–100.0)
Platelets: 145 10*3/uL — ABNORMAL LOW (ref 150–400)
RBC: 2.64 MIL/uL — ABNORMAL LOW (ref 4.22–5.81)
RDW: 13.2 % (ref 11.5–15.5)
WBC: 2.9 10*3/uL — ABNORMAL LOW (ref 4.0–10.5)
nRBC: 0 % (ref 0.0–0.2)

## 2018-07-25 LAB — GLUCOSE, CAPILLARY
Glucose-Capillary: 128 mg/dL — ABNORMAL HIGH (ref 70–99)
Glucose-Capillary: 150 mg/dL — ABNORMAL HIGH (ref 70–99)
Glucose-Capillary: 143 mg/dL — ABNORMAL HIGH (ref 70–99)
Glucose-Capillary: 104 mg/dL — ABNORMAL HIGH (ref 70–99)

## 2018-07-25 MED ORDER — POLYETHYLENE GLYCOL 3350 17 G PO PACK
17.0000 g | PACK | Freq: Once | ORAL | Status: AC
  Administered 2018-07-25: 17 g via ORAL
  Filled 2018-07-25: qty 1

## 2018-07-25 MED ORDER — SORBITOL 70 % SOLN
960.0000 mL | TOPICAL_OIL | Freq: Once | ORAL | Status: DC
  Filled 2018-07-25: qty 473

## 2018-07-25 MED ORDER — ISOSORBIDE MONONITRATE ER 60 MG PO TB24
90.0000 mg | ORAL_TABLET | Freq: Every day | ORAL | Status: DC
  Administered 2018-07-26: 90 mg via ORAL
  Filled 2018-07-25: qty 1

## 2018-07-25 NOTE — Progress Notes (Signed)
[facility] Progress Note    Assessment/ Plan:   1. Dyspnea and volume overload- improved with IV diuresis- switched to PO Lasix 80 BID.    2.  CKD 5: Renal function stable compared to recent outpatient values.  No uremic symptoms currently and no clear indications for dialysis as he is responding to diuretics.  Will be an ongoing discussion regarding dialysis.  His advanced age and frail status make him a suboptimal candidate but it can be discussed--> no indications for it during this hospitalization.   3.  Anemia, pancytopenia:   has known MDS and follows with oncology at [facility], last visit 09/2017.  Hemoglobin generally running in the mid 8s to 9, 8.6 on 10/17.  Admitting Hb is 7.6; plt and WBC at baseline.  Iron sat low at 15%, ferritin 111.  Would likely benefit from IV iron administration this admission- s/p feraheme x 1.  Oncology at [facility] considered ESA administration if needed.  Has not required to date.   4.  Secondary hyperparathyroidism:  iPTH 277 01/2018. Continue home dose calcitriol.   5.  HTN:    On home medications. Monitor with diuresis.  Normotensive at last outpt nephrology visit.   6.  Dispo: OK from renal perspective to d/c with close OP followup with [facility]  Subjective:    Switched to oral Lasix yesterday.  Doing well except for some constipation.     Objective:   BP (!) 173/85   Pulse (!) 57   Temp 98.3 F (36.8 C) (Oral)   Resp 18   Ht 6\' 1"  (1.854 m)   Wt 90.4 kg   SpO2 100%   BMI 26.31 kg/m   Intake/Output Summary (Last 24 hours) at 07/25/2018 1256 Last data filed at 07/25/2018 1010 Gross per 24 hour  Intake 717 ml  Output 1450 ml  Net -733 ml   Weight change: 0.181 kg  Physical Exam: Gen: older gentleman, NAD CVS: RRR Resp: normal WOB, off O2 this AM, clear Abd: nontender and nondistended Ext: no LE edema  Imaging: No results found.  Labs: BMET Recent Labs  Lab 07/22/18 1538 07/23/18 0415 07/24/18 0423  07/25/18 0441  NA 136 135 135 136  K 4.2 3.9 4.0 4.0  CL 100 99 101 100  CO2 21* 20* 21* 21*  GLUCOSE 133* 150* 133* 125*  BUN 83* 81* 80* 82*  CREATININE 4.59* 4.43* 4.61* 4.43*  CALCIUM 9.5 9.4 9.1 9.4   CBC Recent Labs  Lab 07/22/18 1538 07/23/18 0415 07/24/18 0423 07/25/18 0441  WBC 3.8* 4.1 3.8* 2.9*  HGB 7.9* 7.8* 7.9* 7.9*  HCT 25.8* 24.4* 24.3* 24.4*  MCV 96.3 93.1 93.1 92.4  PLT 146* 148* 139* 145*    Medications:    . allopurinol  100 mg Oral Daily  . allopurinol  50 mg Oral Daily  . aspirin EC  81 mg Oral QHS  . calcitRIOL  1 mcg Oral Q M,W,F  . cloNIDine  0.15 mg Oral BID  . docusate sodium  100 mg Oral BID  . fluticasone  1 spray Each Nare BID  . furosemide  80 mg Oral BID  . heparin  5,000 Units Subcutaneous Q8H  . insulin aspart  0-9 Units Subcutaneous TID WC  . [START ON 07/26/2018] isosorbide mononitrate  90 mg Oral Daily  . labetalol  400 mg Oral BID  . latanoprost  1 drop Left Eye QHS  . montelukast  10 mg Oral QHS  . polyethylene glycol  17 g Oral Once  . pravastatin  40 mg Oral Daily  . sodium bicarbonate  325 mg Oral QHS  . tamsulosin  0.4 mg Oral Q1400      Madelon Lips MD 07/25/2018, 12:56 PM

## 2018-07-25 NOTE — Progress Notes (Signed)
Patient slept this shift without complaints. BP this am= 158/82, HR=63, afebrile with o2 sat 97% on RA.

## 2018-07-25 NOTE — Progress Notes (Signed)
[facility]                                                                              Patient Demographics  [name] [name], is a 84 y.o. male, DOB - 07/06/1929, IFO:[id number]  Admit date - 07/22/2018   Admitting Physician Shela Leff, MD  Outpatient Primary MD for the patient is System, Pcp Not In  Outpatient specialists:   LOS - 3  days   Medical records reviewed and are as summarized below:    Chief Complaint  Patient presents with  . Chest Pain       Brief summary   Patient is 84 year old male with CHF, CKD stage IV, diabetes type 2, hypertension, hyperlipidemia, CVA presented with shortness of breath.  Patient reported fatigue, DOE, PND, orthopnea, worsening lower extremity edema for the past few days.  He had been taking previously Lasix 120 mg daily but was reduced to 80 mg daily 3 weeks ago due to worsening kidney function.  He is followed by nephrology at [facility] and has not been started on dialysis yet, does not have permanent access. He was admitted for further work-up  Assessment & Plan    Principal Problem:   Volume overload in the setting of acute on chronic diastolic CHF, CKD stage V not started on dialysis yet -Presented with worsening fatigue, DOE, orthopnea, PND, Lasix dose reduced, BNP 2271 basilar Rales, pedal edema -Nephrology allowing, seen by Dr. Johnney Ou on 12/5, patient received Lasix 60 mg x 1, placed on oral diuresis -2D echo showed EF of 50 to 55%, mild MR, moderate AS -States shortness of breath is improving but still not at baseline, feels fatigued and DOE  Active Problems: Pancytopenia with anemia and thrombocytopenia in the setting of CKD stage V, anemia of chronic disease - no sign of active bleeding - 1 dose of Aranesp 100 MCG x1 on 12/5, H&H stable    Essential hypertension -BP still elevated, increase Imdur to 90 mg daily, continue labetalol, clonidine     Asthma -Currently stable, continue albuterol nebs  as needed    BPH (benign prostatic hyperplasia) -Continue Flomax    DM (diabetes mellitus), type 2 with renal complications (HCC) -Hemoglobin A1c 5.4, continue sliding scale insulin    Dyslipidemia -Continue pravastatin    Gout -Continue allopurinol  History of CVA (cerebral vascular accident) ([place]) -No new FND's, continue aspirin, statin  Constipation, Feeling uncomfortable, no BM since admission, was placed on stool softeners  Code Status: Full code DVT Prophylaxis: Heparin subcu Family Communication: Discussed in detail with the patient, all imaging results, lab results explained to the patient    Disposition Plan: If continues to improve, no acute issues overnight, DC home in a.m. patient does not feel at baseline currently  Time Spent in minutes   25 minutes  Procedures:  2D echo  Consultants:   Nephrology  Antimicrobials:      Medications  Scheduled Meds: . allopurinol  100 mg Oral Daily  . allopurinol  50 mg Oral Daily  . aspirin EC  81 mg Oral QHS  . calcitRIOL  1 mcg Oral Q M,W,F  . cloNIDine  0.15 mg Oral BID  . docusate sodium  100 mg Oral BID  . fluticasone  1 spray Each Nare BID  . furosemide  80 mg Oral BID  . heparin  5,000 Units Subcutaneous Q8H  . insulin aspart  0-9 Units Subcutaneous TID WC  . isosorbide mononitrate  60 mg Oral Daily  . labetalol  400 mg Oral BID  . latanoprost  1 drop Left Eye QHS  . montelukast  10 mg Oral QHS  . polyethylene glycol  17 g Oral Once  . pravastatin  40 mg Oral Daily  . sodium bicarbonate  325 mg Oral QHS  . sorbitol, milk of mag, mineral oil, glycerin (SMOG) enema  960 mL Rectal Once  . tamsulosin  0.4 mg Oral Q1400   Continuous Infusions: PRN Meds:.albuterol   Antibiotics   Anti-infectives (From admission, onward)   None        Subjective:   Chivas Notz was seen and examined today.  Sitting up in the chair, feeling uncomfortable due to constipation.  Shortness of breath is better on  sitting up.  No chest pain.  Patient denies dizziness, abdominal pain, new weakness, numbess, tingling.+ Constipation  Objective:   Vitals:   07/24/18 1620 07/24/18 2019 07/25/18 0400 07/25/18 0850  BP: (!) 167/82 (!) 174/64 (!) 158/82 (!) 173/85  Pulse: (!) 59 61 63 (!) 57  Resp: 18 18 18    Temp: 97.7 F (36.5 C) 97.9 F (36.6 C) 98.5 F (36.9 C) 98.3 F (36.8 C)  TempSrc: Oral Oral Oral Oral  SpO2: 96% 98% 97% 100%  Weight:   90.4 kg   Height:        Intake/Output Summary (Last 24 hours) at 07/25/2018 1251 Last data filed at 07/25/2018 1010 Gross per 24 hour  Intake 717 ml  Output 1450 ml  Net -733 ml     Wt Readings from Last 3 Encounters:  07/25/18 90.4 kg  10/29/17 94.8 kg  09/24/14 116.1 kg    Physical Exam  General: Alert and oriented x 3, NAD  Eyes: ,  HEENT:   Cardiovascular: S1 S2 auscultated, 3/6 SEM, RRR no pedal edema b/l  Respiratory: Decreased breath sound at the base   Gastrointestinal: Soft, nontender, nondistended, + bowel sounds  Ext: no pedal edema bilaterally  Neuro: No new deficits  Musculoskeletal: No digital cyanosis, clubbing  Skin: No rashes  Psych: Normal affect and demeanor, alert and oriented x3     Data Reviewed:  I have personally reviewed following labs and imaging studies  Micro Results No results found for this or any previous visit (from the past 240 hour(s)).  Radiology Reports Dg Chest 2 View  Result Date: 07/22/2018 CLINICAL DATA:  Chest pain, intermittent for 2 weeks. EXAM: CHEST - 2 VIEW COMPARISON:  Chest x-rays dated 10/25/2017 and 01/17/2015. FINDINGS: Cardiomegaly, stable compared to the most recent chest x-ray of 10/25/2017. Atherosclerotic changes noted at the aortic arch. Prominent pulmonary artery suggesting chronic pulmonary artery hypertension. Increased interstitial markings bilaterally, most confluent at the RIGHT lung base. No pleural effusion or pneumothorax seen. No acute or suspicious osseous  finding. IMPRESSION: 1. Increased interstitial markings bilaterally, most confluent at the RIGHT lung base. This could represent edema or pneumonia, likely superimposed on some degree of chronic interstitial lung disease. 2. Stable cardiomegaly. 3. Prominent pulmonary arteries suggesting chronic pulmonary artery hypertension. 4. Aortic atherosclerosis. Electronically Signed   By: Franki Cabot M.D.   On: 07/22/2018 17:44    Lab Data:  CBC: Recent Labs  Lab 07/22/18 1538 07/23/18 0415 07/24/18 0423 07/25/18 0441  WBC 3.8* 4.1 3.8* 2.9*  HGB 7.9* 7.8* 7.9* 7.9*  HCT 25.8* 24.4* 24.3* 24.4*  MCV 96.3 93.1 93.1 92.4  PLT 146* 148* 139* 166*   Basic Metabolic Panel: Recent Labs  Lab 07/22/18 1538 07/23/18 0415 07/24/18 0423 07/25/18 0441  NA 136 135 135 136  K 4.2 3.9 4.0 4.0  CL 100 99 101 100  CO2 21* 20* 21* 21*  GLUCOSE 133* 150* 133* 125*  BUN 83* 81* 80* 82*  CREATININE 4.59* 4.43* 4.61* 4.43*  CALCIUM 9.5 9.4 9.1 9.4   GFR: Estimated Creatinine Clearance: 13.3 mL/min (A) (by C-G formula based on SCr of 4.43 mg/dL (H)). Liver Function Tests: No results for input(s): AST, ALT, ALKPHOS, BILITOT, PROT, ALBUMIN in the last 168 hours. No results for input(s): LIPASE, AMYLASE in the last 168 hours. No results for input(s): AMMONIA in the last 168 hours. Coagulation Profile: No results for input(s): INR, PROTIME in the last 168 hours. Cardiac Enzymes: Recent Labs  Lab 07/22/18 2207 07/23/18 0415 07/23/18 0956  TROPONINI 0.06* 0.06* 0.07*   BNP (last 3 results) No results for input(s): PROBNP in the last 8760 hours. HbA1C: Recent Labs    07/23/18 0415  HGBA1C 5.4   CBG: Recent Labs  Lab 07/24/18 0719 07/24/18 1205 07/24/18 1621 07/24/18 2126 07/25/18 0752  GLUCAP 121* 126* 138* 141* 128*   Lipid Profile: No results for input(s): CHOL, HDL, LDLCALC, TRIG, CHOLHDL, LDLDIRECT in the last 72 hours. Thyroid Function Tests: No results for input(s): TSH,  T4TOTAL, FREET4, T3FREE, THYROIDAB in the last 72 hours. Anemia Panel: Recent Labs    07/23/18 0415  FERRITIN 111  TIBC 227*  IRON 33*   Urine analysis:    Component Value Date/Time   COLORURINE STRAW (A) 10/25/2017 1632   APPEARANCEUR CLEAR 10/25/2017 1632   LABSPEC 1.008 10/25/2017 1632   PHURINE 6.0 10/25/2017 1632   GLUCOSEU NEGATIVE 10/25/2017 1632   HGBUR NEGATIVE 10/25/2017 1632   BILIRUBINUR NEGATIVE 10/25/2017 1632   KETONESUR NEGATIVE 10/25/2017 1632   PROTEINUR 30 (A) 10/25/2017 1632   UROBILINOGEN 1.0 09/21/2014 0035   NITRITE NEGATIVE 10/25/2017 1632   LEUKOCYTESUR NEGATIVE 10/25/2017 1632     Analayah Brooke M.D. [facility] 07/25/2018, 12:51 PM  Pager: [phone] Between 7am to 7pm - call Pager - [phone]  After 7pm go to [email or web address] - password [email or web address]  Call night coverage person covering after 7pm

## 2018-07-26 DIAGNOSIS — J4521 Mild intermittent asthma with (acute) exacerbation: Secondary | ICD-10-CM

## 2018-07-26 LAB — BASIC METABOLIC PANEL
Anion gap: 16 — ABNORMAL HIGH (ref 5–15)
BUN: 78 mg/dL — AB (ref 8–23)
CO2: 21 mmol/L — ABNORMAL LOW (ref 22–32)
Calcium: 9.2 mg/dL (ref 8.9–10.3)
Chloride: 97 mmol/L — ABNORMAL LOW (ref 98–111)
Creatinine, Ser: 4.06 mg/dL — ABNORMAL HIGH (ref 0.61–1.24)
GFR calc Af Amer: 14 mL/min — ABNORMAL LOW (ref >60)
GFR calc non Af Amer: 12 mL/min — ABNORMAL LOW (ref >60)
Glucose, Bld: 128 mg/dL — ABNORMAL HIGH (ref 70–99)
Potassium: 3.8 mmol/L (ref 3.5–5.1)
Sodium: 134 mmol/L — ABNORMAL LOW (ref 135–145)

## 2018-07-26 LAB — CBC
HCT: 24.9 % — ABNORMAL LOW (ref 39.0–52.0)
Hemoglobin: 7.8 g/dL — ABNORMAL LOW (ref 13.0–17.0)
MCH: 29.3 pg (ref 26.0–34.0)
MCHC: 31.3 g/dL (ref 30.0–36.0)
MCV: 93.6 fL (ref 80.0–100.0)
PLATELETS: 150 10*3/uL (ref 150–400)
RBC: 2.66 MIL/uL — AB (ref 4.22–5.81)
RDW: 13.1 % (ref 11.5–15.5)
WBC: 3.1 10*3/uL — ABNORMAL LOW (ref 4.0–10.5)
nRBC: 0 % (ref 0.0–0.2)

## 2018-07-26 LAB — GLUCOSE, CAPILLARY
Glucose-Capillary: 111 mg/dL — ABNORMAL HIGH (ref 70–99)
Glucose-Capillary: 154 mg/dL — ABNORMAL HIGH (ref 70–99)

## 2018-07-26 MED ORDER — FUROSEMIDE 80 MG PO TABS: 80.0000 mg | ORAL_TABLET | Freq: Two times a day (BID) | ORAL | 3 refills | Status: DC

## 2018-07-26 MED ORDER — CLONIDINE HCL 0.3 MG PO TABS: 0.1500 mg | ORAL_TABLET | Freq: Two times a day (BID) | ORAL | 3 refills | Status: DC

## 2018-07-26 MED ORDER — ISOSORBIDE MONONITRATE ER 60 MG PO TB24: 60.0000 mg | ORAL_TABLET | Freq: Every day | ORAL | 3 refills | Status: DC

## 2018-07-26 MED ORDER — CLONIDINE HCL 0.2 MG PO TABS: 0.2000 mg | ORAL_TABLET | Freq: Two times a day (BID) | ORAL | 2 refills | Status: DC

## 2018-07-26 MED ORDER — DOCUSATE SODIUM 100 MG PO CAPS: 100.0000 mg | ORAL_CAPSULE | Freq: Every day | ORAL | 0 refills | Status: DC

## 2018-07-26 NOTE — Progress Notes (Signed)
Placed order for rollator, it will be delivered by [facility] prior to DC today.

## 2018-07-26 NOTE — Progress Notes (Signed)
[facility] Progress Note    Assessment/ Plan:   1. Dyspnea and volume overload- improved with IV diuresis- switched to PO Lasix 80 BID. Rec to d/c on this  2.  CKD 5: Renal function stable compared to recent outpatient values.  No uremic symptoms currently and no clear indications for dialysis as he is responding to diuretics.  Will be an ongoing discussion regarding dialysis.  His advanced age and frail status make him a suboptimal candidate but it can be discussed--> no indications for it during this hospitalization.   3.  Anemia, pancytopenia:   has known MDS and follows with oncology at [facility], last visit 09/2017.  Hemoglobin generally running in the mid 8s to 9, 8.6 on 10/17.  Admitting Hb is 7.6; plt and WBC at baseline.  Iron sat low at 15%, ferritin 111.  Would likely benefit from IV iron administration this admission- s/p feraheme x 1.  Oncology at [facility] considered ESA administration if needed.  Has not required to date.   4.  Secondary hyperparathyroidism:  iPTH 277 01/2018. Continue home dose calcitriol.   5.  HTN:    On home medications. Monitor with diuresis.  Normotensive at last outpt nephrology visit.   6.  Dispo: OK from renal perspective to d/c with close OP followup with [facility]phrology  Subjective:    For d/c today.  Feeling well.    Objective:   BP (!) 159/70   Pulse 66   Temp 98.5 F (36.9 C) (Oral)   Resp 18   Ht 6\' 1"  (1.854 m)   Wt 89.1 kg   SpO2 100%   BMI 25.91 kg/m   Intake/Output Summary (Last 24 hours) at 07/26/2018 1054 Last data filed at 07/26/2018 4967 Gross per 24 hour  Intake 720 ml  Output 750 ml  Net -30 ml   Weight change: -1.361 kg  Physical Exam: Gen: older gentleman, NAD, sitting in chair CVS: RRR Resp: normal WOB, clear bilaterally Abd: nontender and nondistended Ext: no LE edema  Imaging: No results found.  Labs: BMET Recent Labs  Lab 07/22/18 1538 07/23/18 0415 07/24/18 0423 07/25/18 0441 07/26/18 0502   NA 136 135 135 136 134*  K 4.2 3.9 4.0 4.0 3.8  CL 100 99 101 100 97*  CO2 21* 20* 21* 21* 21*  GLUCOSE 133* 150* 133* 125* 128*  BUN 83* 81* 80* 82* 78*  CREATININE 4.59* 4.43* 4.61* 4.43* 4.06*  CALCIUM 9.5 9.4 9.1 9.4 9.2   CBC Recent Labs  Lab 07/23/18 0415 07/24/18 0423 07/25/18 0441 07/26/18 0502  WBC 4.1 3.8* 2.9* 3.1*  HGB 7.8* 7.9* 7.9* 7.8*  HCT 24.4* 24.3* 24.4* 24.9*  MCV 93.1 93.1 92.4 93.6  PLT 148* 139* 145* 150    Medications:    . allopurinol  100 mg Oral Daily  . allopurinol  50 mg Oral Daily  . aspirin EC  81 mg Oral QHS  . calcitRIOL  1 mcg Oral Q M,W,F  . cloNIDine  0.15 mg Oral BID  . docusate sodium  100 mg Oral BID  . fluticasone  1 spray Each Nare BID  . furosemide  80 mg Oral BID  . heparin  5,000 Units Subcutaneous Q8H  . insulin aspart  0-9 Units Subcutaneous TID WC  . isosorbide mononitrate  90 mg Oral Daily  . labetalol  400 mg Oral BID  . latanoprost  1 drop Left Eye QHS  . montelukast  10 mg Oral QHS  . pravastatin  40 mg Oral Daily  . sodium bicarbonate  325 mg Oral QHS  . tamsulosin  0.4 mg Oral Q1400      Madelon Lips MD 07/26/2018, 10:54 AM

## 2018-07-26 NOTE — Progress Notes (Signed)
Discussed discharge instructions with patient and daughter including monitoring BP, follow up appointments and medications.    Included prescription in with discharge instructions.

## 2018-07-26 NOTE — Discharge Summary (Signed)
Physician Discharge Summary   Patient ID: [name] [name] MRN: [id number] DOB/AGE: [birth year] 82 y.o.  Admit date: 07/22/2018 Discharge date: 07/26/2018  Primary Care Physician:  System, Pcp Not In   Recommendations for Outpatient Follow-up:  1. Follow up with PCP in 1-2 weeks 2. Please obtain BMP in 1 week  Home Health: Home health PT OT Equipment/Devices: Rolling walker  Discharge Condition: stable CODE STATUS: FULL  Diet recommendation: Carb modified diet   Discharge Diagnoses:    Marland Kitchen Volume overload/acute on chronic diastolic CHF Chronic kidney disease stage V not started on dialysis yet Pancytopenia with anemia and thrombocytopenia Anemia of chronic disease . BPH . Essential hypertension . DM (diabetes mellitus), type 2 with renal complications ([place]) . Dyslipidemia . Asthma History of CVA Constipation   Consults:  Nephrology    Allergies:   Allergies  Allergen Reactions  . Flu Virus Vaccine Anaphylaxis    FLU VACCINATION  . Fosinopril Sodium Other (See Comments)    Unknown   . Hydrocodone-Acetaminophen     Other reaction(s): GI Upset (intolerance)  . Meloxicam     REACTION: stomah pains, nausea, constipation  . Valsartan Other (See Comments)    Other reaction(s): Other (See Comments) DIOVAN, UNKNOWN - takes losartan at home unknown     DISCHARGE MEDICATIONS: Allergies as of 07/26/2018      Reactions   Flu Virus Vaccine Anaphylaxis   FLU VACCINATION   Fosinopril Sodium Other (See Comments)   Unknown   Hydrocodone-acetaminophen    Other reaction(s): GI Upset (intolerance)   Meloxicam    REACTION: stomah pains, nausea, constipation   Valsartan Other (See Comments)   Other reaction(s): Other (See Comments) DIOVAN, UNKNOWN - takes losartan at home unknown      Medication List    TAKE these medications   acetaminophen 500 MG tablet Commonly known as:  TYLENOL Take 1,000 mg by mouth every 6 (six) hours as needed.   albuterol 108 (90  Base) MCG/ACT inhaler Commonly known as:  PROVENTIL HFA;VENTOLIN HFA Inhale 2 puffs into the lungs 2 (two) times daily.   allopurinol 100 MG tablet Commonly known as:  ZYLOPRIM Take 50-100 mg by mouth See admin instructions. Take 100 mg at 10:00 am and take 50 mg at 1800   amLODipine 10 MG tablet Commonly known as:  NORVASC Take 10 mg by mouth at bedtime.   aspirin EC 81 MG tablet Take 81 mg by mouth at bedtime.   augmented betamethasone dipropionate 0.05 % ointment Commonly known as:  DIPROLENE-AF Apply 1 application topically 2 (two) times daily. On the back of his head   calcitRIOL 0.5 MCG capsule Commonly known as:  ROCALTROL Take 1 mcg by mouth every Monday, Wednesday, and Friday.   cloNIDine 0.2 MG tablet Commonly known as:  CATAPRES Take 1 tablet (0.2 mg total) by mouth 2 (two) times daily. What changed:  when to take this   cloNIDine 0.3 MG tablet Commonly known as:  CATAPRES Take 0.5 tablets (0.15 mg total) by mouth 2 (two) times daily. What changed:    how much to take  when to take this   docusate sodium 100 MG capsule Commonly known as:  COLACE Take 1 capsule (100 mg total) by mouth daily.   fluticasone 50 MCG/ACT nasal spray Commonly known as:  FLONASE Place 1 spray into both nostrils 2 (two) times daily.   furosemide 80 MG tablet Commonly known as:  LASIX Take 1 tablet (80 mg total) by mouth 2 (two) times  daily. What changed:    medication strength  when to take this   hydrALAZINE 100 MG tablet Commonly known as:  APRESOLINE Take 100 mg by mouth 3 (three) times daily. Must take takes at 0900, 1300, and 2200 to prevent drop in the blood pressure   isosorbide mononitrate 60 MG 24 hr tablet Commonly known as:  IMDUR Take 1 tablet (60 mg total) by mouth daily. What changed:    medication strength  how much to take   labetalol 200 MG tablet Commonly known as:  NORMODYNE Take 400 mg by mouth 2 (two) times daily.   latanoprost 0.005 %  ophthalmic solution Commonly known as:  XALATAN Place 1 drop into the left eye at bedtime.   montelukast 10 MG tablet Commonly known as:  SINGULAIR Take 10 mg by mouth at bedtime.   Polyethyl Glycol-Propyl Glycol 0.4-0.3 % Soln Place 1 drop into the right eye at bedtime.   polyethylene glycol packet Commonly known as:  MIRALAX / GLYCOLAX Take 17 g by mouth as needed.   pravastatin 40 MG tablet Commonly known as:  PRAVACHOL Take 40 mg by mouth daily.   sodium bicarbonate 325 MG tablet Take 325 mg by mouth at bedtime.   tamsulosin 0.4 MG Caps capsule Commonly known as:  FLOMAX Take 0.4 mg by mouth See admin instructions. Take at 1400   traMADol 50 MG tablet Commonly known as:  ULTRAM Take 50 mg by mouth every 6 (six) hours as needed for moderate pain.            Durable Medical Equipment  (From admission, onward)         Start     Ordered   07/26/18 1050  For home use only DME 4 wheeled rolling walker with seat  Once    Question:  Patient needs a walker to treat with the following condition  Answer:  Weakness   07/26/18 1050   07/24/18 1202  For home use only DME Walker rolling  Once    Comments:  Rollater  Question:  Patient needs a walker to treat with the following condition  Answer:  CHF (congestive heart failure) ([place])   07/24/18 1201           Brief H and P: For complete details please refer to admission H and P, but in brief *Patient is 84 year old male with CHF, CKD stage IV, diabetes type 2, hypertension, hyperlipidemia, CVA presented with shortness of breath.  Patient reported fatigue, DOE, PND, orthopnea, worsening lower extremity edema for the past few days.  He had been taking previously Lasix 120 mg daily but was reduced to 80 mg daily 3 weeks ago due to worsening kidney function.  He is followed by nephrology at [facility] and has not been started on dialysis yet, does not have permanent access. He was admitted for further work-up  Hospital  Course:   Volume overload in the setting of acute on chronic diastolic CHF, CKD stage V not started on dialysis yet -Presented with worsening fatigue, DOE, orthopnea, PND, Lasix dose had been recently reduced outpatient, BNP 2271 basilar rales, pedal edema -Neurology was consulted, patient was placed on IV Lasix for diuresis.  -2D echo showed EF of 50 to 55%, mild MR, moderate AS -Negative balance of 2.7 L, weight improved from 213 at the time of admission to 196lb at the time of discharge -Nephrology recommended discharging on Lasix 80 mg twice a day and follow-up closely with his nephrologist outpatient  Pancytopenia with anemia and thrombocytopenia in the setting of CKD stage V, anemia of chronic disease - no sign of active bleeding, hemoglobin 7.9 at the time of admission - 1 dose of Aranesp 100 MCG x1 on 12/5, H&H has remained stable    Essential hypertension -BP improving, continue outpatient antihypertensives    Asthma -Currently stable, continue albuterol nebs as needed    BPH (benign prostatic hyperplasia) -Continue Flomax    DM (diabetes mellitus), type 2 with renal complications (HCC) -Hemoglobin A1c 5.4, patient was placed on sliding scale inpatient, diet controlled outpatient    Dyslipidemia -Continue pravastatin    Gout -Continue allopurinol  History of CVA (cerebral vascular accident) ([place]) -No new FND's, continue aspirin, statin  Constipation, Resolved  Day of Discharge S: Feeling close to his baseline, no acute complaints, hoping to go home today  BP 133/71 (BP Location: Left Arm)   Pulse (!) 54   Temp 98.2 F (36.8 C) (Oral)   Resp 16   Ht 6\' 1"  (1.854 m)   Wt 89.1 kg   SpO2 100%   BMI 25.91 kg/m   Physical Exam: General: Alert and awake oriented x3 not in any acute distress. HEENT: anicteric sclera, pupils reactive to light and accommodation CVS: S1-S2 clear no murmur rubs or gallops Chest: clear to auscultation bilaterally, no  wheezing rales or rhonchi Abdomen: soft nontender, nondistended, normal bowel sounds Extremities: no cyanosis, clubbing or edema noted bilaterally Neuro: Cranial nerves II-XII intact, no focal neurological deficits   The results of significant diagnostics from this hospitalization (including imaging, microbiology, ancillary and laboratory) are listed below for reference.      Procedures/Studies:  Dg Chest 2 View  Result Date: 07/22/2018 CLINICAL DATA:  Chest pain, intermittent for 2 weeks. EXAM: CHEST - 2 VIEW COMPARISON:  Chest x-rays dated 10/25/2017 and 01/17/2015. FINDINGS: Cardiomegaly, stable compared to the most recent chest x-ray of 10/25/2017. Atherosclerotic changes noted at the aortic arch. Prominent pulmonary artery suggesting chronic pulmonary artery hypertension. Increased interstitial markings bilaterally, most confluent at the RIGHT lung base. No pleural effusion or pneumothorax seen. No acute or suspicious osseous finding. IMPRESSION: 1. Increased interstitial markings bilaterally, most confluent at the RIGHT lung base. This could represent edema or pneumonia, likely superimposed on some degree of chronic interstitial lung disease. 2. Stable cardiomegaly. 3. Prominent pulmonary arteries suggesting chronic pulmonary artery hypertension. 4. Aortic atherosclerosis. Electronically Signed   By: Franki Cabot M.D.   On: 07/22/2018 17:44       LAB RESULTS: Basic Metabolic Panel: Recent Labs  Lab 07/25/18 0441 07/26/18 0502  NA 136 134*  K 4.0 3.8  CL 100 97*  CO2 21* 21*  GLUCOSE 125* 128*  BUN 82* 78*  CREATININE 4.43* 4.06*  CALCIUM 9.4 9.2   Liver Function Tests: No results for input(s): AST, ALT, ALKPHOS, BILITOT, PROT, ALBUMIN in the last 168 hours. No results for input(s): LIPASE, AMYLASE in the last 168 hours. No results for input(s): AMMONIA in the last 168 hours. CBC: Recent Labs  Lab 07/25/18 0441 07/26/18 0502  WBC 2.9* 3.1*  HGB 7.9* 7.8*  HCT 24.4*  24.9*  MCV 92.4 93.6  PLT 145* 150   Cardiac Enzymes: Recent Labs  Lab 07/23/18 0415 07/23/18 0956  TROPONINI 0.06* 0.07*   BNP: Invalid input(s): POCBNP CBG: Recent Labs  Lab 07/26/18 0809 07/26/18 1247  GLUCAP 111* 154*      Disposition and Follow-up: Discharge Instructions    (HEART FAILURE PATIENTS) Call MD:  April Manson  you have any of the following symptoms: 1) 3 pound weight gain in 24 hours or 5 pounds in 1 week 2) shortness of breath, with or without a dry hacking cough 3) swelling in the hands, feet or stomach 4) if you have to sleep on extra pillows at night in order to breathe.   Complete by:  As directed    Diet - low sodium heart healthy   Complete by:  As directed    Increase activity slowly   Complete by:  As directed        [place] with home health   DISCHARGE FOLLOW-UP Follow-up Information    Daeihagh, Wynelle Fanny, MD. Schedule an appointment as soon as possible for a visit in 2 week(s).   Specialty:  Nephrology Contact information: [address] [place] [id number] [phone]            Time coordinating discharge:  35-minutes  Signed:   Estill Cotta M.D. [facility] 07/26/2018, 1:19 PM Pager: [phone]

## 2018-12-09 ENCOUNTER — Inpatient Hospital Stay (HOSPITAL_COMMUNITY)
Admission: EM | Admit: 2018-12-09 | Discharge: 2018-12-11 | DRG: 291 | Disposition: A | Discharge status: Hospice | Payer: Medicare Other | Attending: Family Medicine | Admitting: Family Medicine

## 2018-12-09 ENCOUNTER — Emergency Department (HOSPITAL_COMMUNITY): Payer: Medicare Other

## 2018-12-09 ENCOUNTER — Encounter (HOSPITAL_COMMUNITY): Payer: Self-pay | Admitting: Emergency Medicine

## 2018-12-09 ENCOUNTER — Other Ambulatory Visit: Payer: Self-pay

## 2018-12-09 DIAGNOSIS — R195 Other fecal abnormalities: Secondary | ICD-10-CM | POA: Diagnosis not present

## 2018-12-09 DIAGNOSIS — I350 Nonrheumatic aortic (valve) stenosis: Secondary | ICD-10-CM | POA: Diagnosis present

## 2018-12-09 DIAGNOSIS — E785 Hyperlipidemia, unspecified: Secondary | ICD-10-CM | POA: Diagnosis present

## 2018-12-09 DIAGNOSIS — I248 Other forms of acute ischemic heart disease: Secondary | ICD-10-CM | POA: Diagnosis present

## 2018-12-09 DIAGNOSIS — D62 Acute posthemorrhagic anemia: Secondary | ICD-10-CM | POA: Diagnosis present

## 2018-12-09 DIAGNOSIS — R0902 Hypoxemia: Secondary | ICD-10-CM | POA: Diagnosis present

## 2018-12-09 DIAGNOSIS — I132 Hypertensive heart and chronic kidney disease with heart failure and with stage 5 chronic kidney disease, or end stage renal disease: Secondary | ICD-10-CM | POA: Diagnosis present

## 2018-12-09 DIAGNOSIS — D649 Anemia, unspecified: Secondary | ICD-10-CM | POA: Diagnosis not present

## 2018-12-09 DIAGNOSIS — I5033 Acute on chronic diastolic (congestive) heart failure: Secondary | ICD-10-CM | POA: Diagnosis not present

## 2018-12-09 DIAGNOSIS — E1122 Type 2 diabetes mellitus with diabetic chronic kidney disease: Secondary | ICD-10-CM | POA: Diagnosis present

## 2018-12-09 DIAGNOSIS — Z7951 Long term (current) use of inhaled steroids: Secondary | ICD-10-CM

## 2018-12-09 DIAGNOSIS — N185 Chronic kidney disease, stage 5: Secondary | ICD-10-CM | POA: Diagnosis present

## 2018-12-09 DIAGNOSIS — K922 Gastrointestinal hemorrhage, unspecified: Secondary | ICD-10-CM | POA: Diagnosis present

## 2018-12-09 DIAGNOSIS — Z96653 Presence of artificial knee joint, bilateral: Secondary | ICD-10-CM | POA: Diagnosis present

## 2018-12-09 DIAGNOSIS — D631 Anemia in chronic kidney disease: Secondary | ICD-10-CM | POA: Diagnosis present

## 2018-12-09 DIAGNOSIS — M109 Gout, unspecified: Secondary | ICD-10-CM | POA: Diagnosis present

## 2018-12-09 DIAGNOSIS — N2581 Secondary hyperparathyroidism of renal origin: Secondary | ICD-10-CM | POA: Diagnosis present

## 2018-12-09 DIAGNOSIS — R0602 Shortness of breath: Secondary | ICD-10-CM | POA: Diagnosis present

## 2018-12-09 DIAGNOSIS — Z515 Encounter for palliative care: Secondary | ICD-10-CM | POA: Diagnosis present

## 2018-12-09 DIAGNOSIS — Z66 Do not resuscitate: Secondary | ICD-10-CM | POA: Diagnosis present

## 2018-12-09 DIAGNOSIS — Z79899 Other long term (current) drug therapy: Secondary | ICD-10-CM | POA: Diagnosis not present

## 2018-12-09 DIAGNOSIS — Z7982 Long term (current) use of aspirin: Secondary | ICD-10-CM | POA: Diagnosis not present

## 2018-12-09 DIAGNOSIS — E8779 Other fluid overload: Secondary | ICD-10-CM | POA: Diagnosis not present

## 2018-12-09 DIAGNOSIS — J810 Acute pulmonary edema: Secondary | ICD-10-CM | POA: Diagnosis present

## 2018-12-09 DIAGNOSIS — K5909 Other constipation: Secondary | ICD-10-CM | POA: Diagnosis present

## 2018-12-09 DIAGNOSIS — E1151 Type 2 diabetes mellitus with diabetic peripheral angiopathy without gangrene: Secondary | ICD-10-CM | POA: Diagnosis present

## 2018-12-09 DIAGNOSIS — H409 Unspecified glaucoma: Secondary | ICD-10-CM | POA: Diagnosis present

## 2018-12-09 DIAGNOSIS — G4733 Obstructive sleep apnea (adult) (pediatric): Secondary | ICD-10-CM | POA: Diagnosis present

## 2018-12-09 DIAGNOSIS — N179 Acute kidney failure, unspecified: Secondary | ICD-10-CM | POA: Diagnosis not present

## 2018-12-09 DIAGNOSIS — N400 Enlarged prostate without lower urinary tract symptoms: Secondary | ICD-10-CM | POA: Diagnosis present

## 2018-12-09 DIAGNOSIS — I509 Heart failure, unspecified: Secondary | ICD-10-CM | POA: Diagnosis not present

## 2018-12-09 DIAGNOSIS — Z8673 Personal history of transient ischemic attack (TIA), and cerebral infarction without residual deficits: Secondary | ICD-10-CM | POA: Diagnosis not present

## 2018-12-09 DIAGNOSIS — I5043 Acute on chronic combined systolic (congestive) and diastolic (congestive) heart failure: Secondary | ICD-10-CM | POA: Diagnosis present

## 2018-12-09 DIAGNOSIS — Z87891 Personal history of nicotine dependence: Secondary | ICD-10-CM

## 2018-12-09 DIAGNOSIS — J45909 Unspecified asthma, uncomplicated: Secondary | ICD-10-CM | POA: Diagnosis present

## 2018-12-09 DIAGNOSIS — Z833 Family history of diabetes mellitus: Secondary | ICD-10-CM

## 2018-12-09 DIAGNOSIS — Z7189 Other specified counseling: Secondary | ICD-10-CM | POA: Diagnosis not present

## 2018-12-09 DIAGNOSIS — E8770 Fluid overload, unspecified: Secondary | ICD-10-CM | POA: Diagnosis present

## 2018-12-09 HISTORY — DX: Dyspnea, unspecified: R06.00

## 2018-12-09 HISTORY — DX: Anemia, unspecified: D64.9

## 2018-12-09 LAB — COMPREHENSIVE METABOLIC PANEL
ALT: 56 U/L — ABNORMAL HIGH (ref 0–44)
AST: 40 U/L (ref 15–41)
Albumin: 3.6 g/dL (ref 3.5–5.0)
Alkaline Phosphatase: 46 U/L (ref 38–126)
Anion gap: 18 — ABNORMAL HIGH (ref 5–15)
BUN: 143 mg/dL — ABNORMAL HIGH (ref 8–23)
CO2: 24 mmol/L (ref 22–32)
Calcium: 9.5 mg/dL (ref 8.9–10.3)
Chloride: 95 mmol/L — ABNORMAL LOW (ref 98–111)
Creatinine, Ser: 5.61 mg/dL — ABNORMAL HIGH (ref 0.61–1.24)
GFR calc Af Amer: 10 mL/min — ABNORMAL LOW (ref >60)
GFR calc non Af Amer: 8 mL/min — ABNORMAL LOW (ref >60)
Glucose, Bld: 170 mg/dL — ABNORMAL HIGH (ref 70–99)
Potassium: 4.7 mmol/L (ref 3.5–5.1)
Sodium: 137 mmol/L (ref 135–145)
Total Bilirubin: 0.9 mg/dL (ref 0.3–1.2)
Total Protein: 6.9 g/dL (ref 6.5–8.1)

## 2018-12-09 LAB — IRON AND TIBC
Iron: 146 ug/dL (ref 45–182)
Saturation Ratios: 54 % — ABNORMAL HIGH (ref 17.9–39.5)
TIBC: 272 ug/dL (ref 250–450)
UIBC: 126 ug/dL

## 2018-12-09 LAB — FERRITIN: Ferritin: 79 ng/mL (ref 24–336)

## 2018-12-09 LAB — HEMOGLOBIN AND HEMATOCRIT, BLOOD
HCT: 24 % — ABNORMAL LOW (ref 39.0–52.0)
Hemoglobin: 7.7 g/dL — ABNORMAL LOW (ref 13.0–17.0)

## 2018-12-09 LAB — CBC
HCT: 21.4 % — ABNORMAL LOW (ref 39.0–52.0)
Hemoglobin: 6.6 g/dL — CL (ref 13.0–17.0)
MCH: 29.1 pg (ref 26.0–34.0)
MCHC: 30.8 g/dL (ref 30.0–36.0)
MCV: 94.3 fL (ref 80.0–100.0)
Platelets: 125 10*3/uL — ABNORMAL LOW (ref 150–400)
RBC: 2.27 MIL/uL — ABNORMAL LOW (ref 4.22–5.81)
RDW: 15.2 % (ref 11.5–15.5)
WBC: 6.1 10*3/uL (ref 4.0–10.5)
nRBC: 0 % (ref 0.0–0.2)

## 2018-12-09 LAB — ABO/RH: ABO/RH(D): AB POS

## 2018-12-09 LAB — LIPASE, BLOOD: Lipase: 56 U/L — ABNORMAL HIGH (ref 11–51)

## 2018-12-09 LAB — TROPONIN I
Troponin I: 0.58 ng/mL (ref <0.03)
Troponin I: 0.79 ng/mL (ref <0.03)

## 2018-12-09 LAB — POC OCCULT BLOOD, ED: Fecal Occult Bld: POSITIVE — AB

## 2018-12-09 LAB — PREPARE RBC (CROSSMATCH)

## 2018-12-09 LAB — GLUCOSE, CAPILLARY
Glucose-Capillary: 146 mg/dL — ABNORMAL HIGH (ref 70–99)
Glucose-Capillary: 154 mg/dL — ABNORMAL HIGH (ref 70–99)

## 2018-12-09 MED ORDER — FUROSEMIDE 10 MG/ML IJ SOLN
60.0000 mg | Freq: Once | INTRAMUSCULAR | Status: AC
  Administered 2018-12-09: 60 mg via INTRAVENOUS
  Filled 2018-12-09: qty 6

## 2018-12-09 MED ORDER — FUROSEMIDE 10 MG/ML IJ SOLN
100.0000 mg | Freq: Two times a day (BID) | INTRAVENOUS | Status: DC
  Filled 2018-12-09 (×2): qty 10

## 2018-12-09 MED ORDER — ALBUTEROL SULFATE (2.5 MG/3ML) 0.083% IN NEBU
2.5000 mg | INHALATION_SOLUTION | RESPIRATORY_TRACT | Status: DC | PRN
  Administered 2018-12-09: 2.5 mg via RESPIRATORY_TRACT
  Filled 2018-12-09: qty 3

## 2018-12-09 MED ORDER — AMLODIPINE BESYLATE 10 MG PO TABS
10.0000 mg | ORAL_TABLET | Freq: Every day | ORAL | Status: DC
  Administered 2018-12-09 – 2018-12-10 (×2): 10 mg via ORAL
  Filled 2018-12-09 (×2): qty 1

## 2018-12-09 MED ORDER — FUROSEMIDE 10 MG/ML IJ SOLN
10.0000 mg/h | INTRAVENOUS | Status: DC
  Administered 2018-12-09: 10 mg/h via INTRAVENOUS
  Filled 2018-12-09: qty 25

## 2018-12-09 MED ORDER — FUROSEMIDE 10 MG/ML IJ SOLN: 100.0000 mg | Freq: Two times a day (BID) | INTRAVENOUS | Status: DC

## 2018-12-09 MED ORDER — POLYETHYL GLYCOL-PROPYL GLYCOL 0.4-0.3 % OP SOLN: 1.0000 [drp] | Freq: Every day | OPHTHALMIC | Status: DC

## 2018-12-09 MED ORDER — INSULIN ASPART 100 UNIT/ML ~~LOC~~ SOLN
0.0000 [IU] | Freq: Three times a day (TID) | SUBCUTANEOUS | Status: DC
  Administered 2018-12-09 – 2018-12-10 (×2): 2 [IU] via SUBCUTANEOUS
  Administered 2018-12-10: 13:00:00 3 [IU] via SUBCUTANEOUS

## 2018-12-09 MED ORDER — FENTANYL CITRATE (PF) 100 MCG/2ML IJ SOLN
50.0000 ug | Freq: Once | INTRAMUSCULAR | Status: AC
  Administered 2018-12-09: 50 ug via INTRAVENOUS
  Filled 2018-12-09: qty 2

## 2018-12-09 MED ORDER — POLYETHYLENE GLYCOL 3350 17 G PO PACK: 17.0000 g | PACK | ORAL | Status: DC | PRN

## 2018-12-09 MED ORDER — POLYVINYL ALCOHOL 1.4 % OP SOLN
1.0000 [drp] | Freq: Every day | OPHTHALMIC | Status: DC
  Administered 2018-12-09 – 2018-12-10 (×2): 1 [drp] via OPHTHALMIC
  Filled 2018-12-09: qty 15

## 2018-12-09 MED ORDER — FUROSEMIDE 10 MG/ML IJ SOLN: 100.0000 mg | Freq: Once | INTRAVENOUS | Status: DC

## 2018-12-09 MED ORDER — MONTELUKAST SODIUM 10 MG PO TABS
10.0000 mg | ORAL_TABLET | Freq: Every day | ORAL | Status: DC
  Administered 2018-12-09 – 2018-12-10 (×2): 10 mg via ORAL
  Filled 2018-12-09 (×2): qty 1

## 2018-12-09 MED ORDER — FLUTICASONE PROPIONATE 50 MCG/ACT NA SUSP
1.0000 | Freq: Two times a day (BID) | NASAL | Status: DC
  Administered 2018-12-09 – 2018-12-10 (×3): 1 via NASAL
  Filled 2018-12-09: qty 16

## 2018-12-09 MED ORDER — SODIUM CHLORIDE 0.9% IV SOLUTION
Freq: Once | INTRAVENOUS | Status: AC
  Administered 2018-12-09: 16:00:00 via INTRAVENOUS

## 2018-12-09 MED ORDER — LATANOPROST 0.005 % OP SOLN
1.0000 [drp] | Freq: Every day | OPHTHALMIC | Status: DC
  Administered 2018-12-09 – 2018-12-10 (×2): 1 [drp] via OPHTHALMIC
  Filled 2018-12-09: qty 2.5

## 2018-12-09 MED ORDER — DARBEPOETIN ALFA 100 MCG/0.5ML IJ SOSY
100.0000 ug | PREFILLED_SYRINGE | Freq: Once | INTRAMUSCULAR | Status: AC
  Administered 2018-12-09: 100 ug via SUBCUTANEOUS
  Filled 2018-12-09: qty 0.5

## 2018-12-09 MED ORDER — DOCUSATE SODIUM 100 MG PO CAPS
100.0000 mg | ORAL_CAPSULE | Freq: Every day | ORAL | Status: DC
  Administered 2018-12-09: 100 mg via ORAL
  Filled 2018-12-09: qty 1

## 2018-12-09 MED ORDER — TAMSULOSIN HCL 0.4 MG PO CAPS
0.4000 mg | ORAL_CAPSULE | Freq: Every day | ORAL | Status: DC
  Administered 2018-12-09: 0.4 mg via ORAL
  Filled 2018-12-09: qty 1

## 2018-12-09 MED ORDER — HYDRALAZINE HCL 25 MG PO TABS
50.0000 mg | ORAL_TABLET | Freq: Three times a day (TID) | ORAL | Status: DC
  Administered 2018-12-09 – 2018-12-10 (×4): 50 mg via ORAL
  Filled 2018-12-09 (×9): qty 2

## 2018-12-09 MED ORDER — SODIUM BICARBONATE 650 MG PO TABS: 325.0000 mg | ORAL_TABLET | Freq: Every day | ORAL | Status: DC

## 2018-12-09 MED ORDER — FUROSEMIDE 10 MG/ML IJ SOLN
100.0000 mg | Freq: Once | INTRAVENOUS | Status: AC
  Administered 2018-12-10: 100 mg via INTRAVENOUS
  Filled 2018-12-09: qty 10

## 2018-12-09 MED ORDER — SODIUM CHLORIDE 0.9% FLUSH
3.0000 mL | Freq: Two times a day (BID) | INTRAVENOUS | Status: DC
  Administered 2018-12-09 – 2018-12-10 (×4): 3 mL via INTRAVENOUS

## 2018-12-09 MED ORDER — FUROSEMIDE 10 MG/ML IJ SOLN
100.0000 mg | Freq: Once | INTRAVENOUS | Status: AC
  Administered 2018-12-09: 100 mg via INTRAVENOUS
  Filled 2018-12-09 (×2): qty 10

## 2018-12-09 MED ORDER — ALLOPURINOL 100 MG PO TABS: 50.0000 mg | ORAL_TABLET | Freq: Every day | ORAL | Status: DC

## 2018-12-09 NOTE — ED Notes (Signed)
Dr. Venora Maples aware of BUN and Creatine, ok to give lasix per Dr. Venora Maples

## 2018-12-09 NOTE — ED Notes (Signed)
Admitting at bedside 

## 2018-12-09 NOTE — Progress Notes (Signed)
Patient came to the floor from ED, 1 unit of blood started in ED, blood end transfusion is 1542, v/s stable, patient having Shortness of breath, lasix given after transfusion per MD order. Will continue to monitor the patient.

## 2018-12-09 NOTE — ED Triage Notes (Signed)
Pt arrives to ED from home with complaints of left sided chest pain that radiates to his left arm since yesterday evening. Pt awoke this morning with continued chest pain and shortness of breath. Pt has hx of CHF and has gained six pounds over the last three days. EMS reports pt was found to have O2 at 90% on room air and placed pt on 3L [place]. Pt received 234 aspirin and nitro x2 via EMS. Chest pain has eased since.

## 2018-12-09 NOTE — Consult Note (Signed)
[facility] Renal Consultation Note  Requesting MD: Hensel  Indication for Consultation:  CKD   Chief complaint: shortness of breath   HPI:  [name] [name] is a 85 y.o. male with a history of CKD stage V not on dialysis, HTN, CHF, anemia, and secondary hyperparathyroidism who presented with shortness of breath and weakness.  The patient states that he follows with nephrology in [place]; per Epic previously seen by Dr. Randie Heinz.  See from his 05/2018 clinic note that he had both refused dialysis but said that he "may think about it."  He tells me that he was told he needed dialysis 2 years ago and he said "no way" and he states that he would not want to go on dialysis.  He watched a first cousin suffer quite a bit on dialysis and this gentleman just recently passed away.  We discussed that the alternative to not doing dialysis should his renal function worsen or fail to improve would be to transition to comfort measures; he then states that "I will think about it."  He was previously felt to be a suboptimal candidate for dialysis per notes from a 07/2018 [facility] hospitalization.  Per cardiology note on 10/21/2018, he had consistently stated to them that he would rather die than go on dialysis.  He denies nausea or vomiting.  No altered mental status.  Creatinine, Ser  Date/Time Value Ref Range Status  12/09/2018 08:27 AM 5.61 (H) 0.61 - 1.24 mg/dL Final  07/26/2018 05:02 AM 4.06 (H) 0.61 - 1.24 mg/dL Final  07/25/2018 04:41 AM 4.43 (H) 0.61 - 1.24 mg/dL Final  07/24/2018 04:23 AM 4.61 (H) 0.61 - 1.24 mg/dL Final  07/23/2018 04:15 AM 4.43 (H) 0.61 - 1.24 mg/dL Final  07/22/2018 03:38 PM 4.59 (H) 0.61 - 1.24 mg/dL Final  10/29/2017 05:03 AM 4.00 (H) 0.61 - 1.24 mg/dL Final  10/28/2017 04:25 AM 3.92 (H) 0.61 - 1.24 mg/dL Final  10/27/2017 05:32 AM 3.83 (H) 0.61 - 1.24 mg/dL Final  10/26/2017 01:46 AM 3.78 (H) 0.61 - 1.24 mg/dL Final  10/25/2017 02:19 PM 3.90 (H) 0.61 -  1.24 mg/dL Final  12/04/2016 09:54 AM 3.70 (H) 0.61 - 1.24 mg/dL Final  12/04/2016 09:28 AM 3.31 (H) 0.61 - 1.24 mg/dL Final  01/17/2015 12:00 PM 3.70 (H) 0.61 - 1.24 mg/dL Final  09/24/2014 03:43 AM 3.55 (H) 0.50 - 1.35 mg/dL Final  09/23/2014 04:13 AM 3.85 (H) 0.50 - 1.35 mg/dL Final  09/22/2014 04:50 AM 3.63 (H) 0.50 - 1.35 mg/dL Final  09/21/2014 02:19 AM 3.54 (H) 0.50 - 1.35 mg/dL Final  09/20/2014 10:24 PM 3.60 (H) 0.50 - 1.35 mg/dL Final  03/02/2010 08:05 AM 2.27 (H) 0.4 - 1.5 mg/dL Final  03/01/2010 06:55 AM 2.61 (H) 0.4 - 1.5 mg/dL Final  02/28/2010 05:00 AM 2.89 (H) 0.4 - 1.5 mg/dL Final  02/27/2010 04:55 AM 2.44 (H) 0.4 - 1.5 mg/dL Final  02/16/2010 11:00 AM 2.01 (H) 0.4 - 1.5 mg/dL Final  07/04/2008 12:58 PM 1.7 (H) 0.4 - 1.5 mg/dL Final  03/24/2007 05:05 AM 1.30  Final  03/23/2007 08:14 PM 1.5  Final     PMHx:   Past Medical History:  Diagnosis Date  . Anemia 12/09/2018   blood transfusion given  . CHF (congestive heart failure) ([place])   . CKD (chronic kidney disease), stage IV ([place]) 10/25/2017  . Diabetes mellitus without complication ([place])   . Dyspnea   . Hyperlipidemia   . Hypertension   . Stroke [facility])  2009    Past Surgical History:  Procedure Laterality Date  . NECK SURGERY     lump removed  . REPLACEMENT TOTAL KNEE     Left and Right    Family Hx:  Family History  Problem Relation Age of Onset  . Diabetes Brother     Social History:  reports that he has quit smoking. He has never used smokeless tobacco. He reports that he does not drink alcohol or use drugs.  Allergies:  Allergies  Allergen Reactions  . Flu Virus Vaccine Anaphylaxis    FLU VACCINATION  . Fosinopril Sodium Other (See Comments)    Unknown   . Hydrocodone-Acetaminophen     Other reaction(s): GI Upset (intolerance)  . Meloxicam     REACTION: stomah pains, nausea, constipation  . Valsartan Other (See Comments)    Other reaction(s): Other (See Comments) DIOVAN, UNKNOWN -  takes losartan at home unknown    Medications: Prior to Admission medications   Medication Sig Start Date End Date Taking? Authorizing Provider  acetaminophen (TYLENOL) 500 MG tablet Take 1,000 mg by mouth every 6 (six) hours as needed for mild pain.    Yes [provider]  albuterol (PROVENTIL HFA;VENTOLIN HFA) 108 (90 Base) MCG/ACT inhaler Inhale 2 puffs into the lungs 2 (two) times daily. 05/21/18  Yes [provider]  allopurinol (ZYLOPRIM) 100 MG tablet Take 150 mg by mouth daily.  05/15/18  Yes [provider]  amLODipine (NORVASC) 10 MG tablet Take 10 mg by mouth at bedtime.    Yes [provider]  aspirin EC 81 MG tablet Take 81 mg by mouth at bedtime.    Yes [provider]  augmented betamethasone dipropionate (DIPROLENE-AF) 0.05 % ointment Apply 1 application topically 2 (two) times daily. On the back of his head 09/23/17  Yes [provider]  calcitRIOL (ROCALTROL) 0.5 MCG capsule Take 1 mcg by mouth every Monday, Wednesday, and Friday. 06/23/18  Yes [provider]  cloNIDine (CATAPRES) 0.3 MG tablet Take 0.5 tablets (0.15 mg total) by mouth 2 (two) times daily. 07/26/18  Yes Rai, Ripudeep K, MD  docusate sodium (COLACE) 100 MG capsule Take 1 capsule (100 mg total) by mouth daily. 07/26/18  Yes Rai, Ripudeep K, MD  fluocinonide ointment (LIDEX) 0.32 % Apply 1 application topically 2 (two) times a day. To worst areas of itching. 10/06/18  Yes [provider]  fluticasone (FLONASE) 50 MCG/ACT nasal spray Place 1 spray into both nostrils 2 (two) times daily.   Yes [provider]  furosemide (LASIX) 80 MG tablet Take 1 tablet (80 mg total) by mouth 2 (two) times daily. 07/26/18  Yes Rai, Ripudeep K, MD  hydrALAZINE (APRESOLINE) 100 MG tablet Take 100 mg by mouth 3 (three) times daily. Must take takes at 0900, 1300, and 2200 to prevent drop in the blood pressure   Yes [provider]  isosorbide mononitrate  (IMDUR) 60 MG 24 hr tablet Take 1 tablet (60 mg total) by mouth daily. 07/26/18  Yes Rai, Ripudeep K, MD  labetalol (NORMODYNE) 200 MG tablet Take 400 mg by mouth 2 (two) times daily.    Yes [provider]  latanoprost (XALATAN) 0.005 % ophthalmic solution Place 1 drop into the left eye at bedtime.   Yes [provider]  montelukast (SINGULAIR) 10 MG tablet Take 10 mg by mouth at bedtime.  10/09/17  Yes [provider]  Polyethyl Glycol-Propyl Glycol 0.4-0.3 % SOLN Place 1 drop into the right  eye at bedtime.   Yes [provider]  polyethylene glycol (MIRALAX / GLYCOLAX) packet Take 17 g by mouth as needed for mild constipation.  05/01/18  Yes [provider]  pravastatin (PRAVACHOL) 40 MG tablet Take 40 mg by mouth daily.   Yes [provider]  sodium bicarbonate 325 MG tablet Take 325 mg by mouth at bedtime.   Yes [provider]  tamsulosin (FLOMAX) 0.4 MG CAPS capsule Take 0.4 mg by mouth See admin instructions. Take at 1400   Yes [provider]  cloNIDine (CATAPRES) 0.2 MG tablet Take 1 tablet (0.2 mg total) by mouth 2 (two) times daily. Patient not taking: Reported on 12/09/2018 07/26/18   Mendel Corning, MD    I have reviewed the patient's current medications.  Labs:  BMP Latest Ref Rng & Units 12/09/2018 07/26/2018 07/25/2018  Glucose 70 - 99 mg/dL 170(H) 128(H) 125(H)  BUN 8 - 23 mg/dL 143(H) 78(H) 82(H)  Creatinine 0.61 - 1.24 mg/dL 5.61(H) 4.06(H) 4.43(H)  Sodium 135 - 145 mmol/L 137 134(L) 136  Potassium 3.5 - 5.1 mmol/L 4.7 3.8 4.0  Chloride 98 - 111 mmol/L 95(L) 97(L) 100  CO2 22 - 32 mmol/L 24 21(L) 21(L)  Calcium 8.9 - 10.3 mg/dL 9.5 9.2 9.4    Urinalysis    Component Value Date/Time   COLORURINE STRAW (A) 10/25/2017 1632   APPEARANCEUR CLEAR 10/25/2017 1632   LABSPEC 1.008 10/25/2017 1632   PHURINE 6.0 10/25/2017 1632   GLUCOSEU NEGATIVE 10/25/2017 1632   HGBUR NEGATIVE 10/25/2017 1632   BILIRUBINUR  NEGATIVE 10/25/2017 1632   KETONESUR NEGATIVE 10/25/2017 1632   PROTEINUR 30 (A) 10/25/2017 1632   UROBILINOGEN 1.0 09/21/2014 0035   NITRITE NEGATIVE 10/25/2017 1632   LEUKOCYTESUR NEGATIVE 10/25/2017 1632     ROS:  Pertinent items noted in HPI and remainder of comprehensive ROS otherwise negative.  Physical Exam: Vitals:   12/09/18 1400 12/09/18 1547  BP: 120/68 128/76  Pulse: 75 74  Resp: 19 (!) 22  Temp: 97.9 F (36.6 C) 97.7 F (36.5 C)  SpO2: 94% 97%     General: Elderly male in bed in no acute distress at rest HEENT: Normocephalic atraumatic Eyes: Ocular movements intact sclera anicteric Neck: Neck supple trachea midline Heart: S1 S2 no rub appreciated Lungs: Occasional wheeze normal work of breathing at rest  abdomen: Nontender nondistended Extremities: trace lower extremity edema Skin: somewhat Decreased skin turgor Neuro: Alert and oriented x3 and conversant Psych normal normal mood and affect  Assessment/Plan:  # CKD stage V - CKD, previously has refused dialysis - Follows with nephrology at [place] residual bladder scan and place foley if over 300 mL retained  - No emergent indication for dialysis  - Follow lab trends - Defer bicarb for now - acceptable range   # CHF acute on chronic systolic - Would assess response to the Lasix administered prior to scheduling further IV lasix  # Anemia of chronic disease  - Secondary in part to CKD  - Agree with plans for transfusion  - Will dose ESA as well - aranesp 100 mcg once   # HTN - acceptable   # Secondary hyperparathyroidism  - Update intact PTH    Claudia Desanctis 12/09/2018, 6:15 PM

## 2018-12-09 NOTE — Progress Notes (Addendum)
Pt noted desating SPO2 to 85% on 2l  O2, and pt is in labored breathing, crackles auscultated bilateral lungs. Family med service was page. awaiting orders.  Family service MD came to assess pt. ordes carried out.   0019 Rapid response notified and seen pt.

## 2018-12-09 NOTE — ED Provider Notes (Signed)
[place] [facility] EMERGENCY DEPARTMENT Provider Note   CSN: [id number] Arrival date & time: 12/09/18  9509    History   Chief Complaint Chief Complaint  Patient presents with  . Chest Pain    HPI [name] [name] is a 85 y.o. male.     HPI Patient is an 85 year old male presents the emergency department complaints of chest discomfort with radiation to his left arm since yesterday evening.  He awoke this morning with progressive shortness of breath.  He does have a history of congestive heart failure and reports 6 to 8 pounds of weight gain over the past several days.  EMS found the patient to have O2 sats of 90% on room air and he was placed on nasal cannula.  He was given aspirin and nitroglycerin by EMS.  He reports decreased chest discomfort at this time.  He also has a history of chronic kidney disease.  He reports no recent fever or chills.  No recent contact with COVID-19 positive patients or patients under investigation for COVID-19.  He states he is primarily been able to remain home.  No history of DVT or pulmonary embolism.  He reports some lower extremity swelling and mild orthopnea.  He has a history of hypertension, hyperlipidemia, stroke, congestive heart failure.   Past Medical History:  Diagnosis Date  . CHF (congestive heart failure) ([place])   . CKD (chronic kidney disease), stage IV ([place]) 10/25/2017  . Diabetes mellitus without complication ([place])   . Hyperlipidemia   . Hypertension   . Stroke [facility])    2009    Patient Active Problem List   Diagnosis Date Noted  . Acute exacerbation of CHF (congestive heart failure) ([place]) 07/22/2018  . Volume overload 07/22/2018  . ESRD (end stage renal disease) ([place]) 07/22/2018  . Anemia of chronic disease 07/22/2018  . Gout 07/22/2018  . CVA (cerebral vascular accident) ([place]) 07/22/2018  . Physical deconditioning 07/22/2018  . CKD (chronic kidney disease), stage V ([place]) 10/26/2017  . Acute on chronic diastolic  heart failure ([place]) 10/26/2017  . Right foot pain   . Hyperkalemia   . Acute on chronic diastolic CHF (congestive heart failure) ([place]) 10/25/2017  . DM (diabetes mellitus), type 2 with renal complications ([place]) 32/67/1245  . Dyslipidemia 10/25/2017  . Chest pain 09/21/2014  . AORTIC VALVE SCLEROSIS 02/24/2009  . PERIPHERAL EDEMA 11/29/2008  . THROMBOCYTOPENIA 11/25/2008  . PVD 11/25/2008  . RESTING TREMOR 11/11/2008  . BPH (benign prostatic hyperplasia) 07/04/2008  . Asthma 07/17/2007  . OBSTRUCTIVE SLEEP APNEA 06/22/2007  . OVERACTIVE BLADDER 06/15/2007  . History of cardiovascular disorder 03/30/2007  . Essential hypertension 03/23/2007  . DEGENERATIVE JOINT DISEASE 03/23/2007    Past Surgical History:  Procedure Laterality Date  . NECK SURGERY     lump removed  . REPLACEMENT TOTAL KNEE     Left and Right        Home Medications    Prior to Admission medications   Medication Sig Start Date End Date Taking? Authorizing Provider  acetaminophen (TYLENOL) 500 MG tablet Take 1,000 mg by mouth every 6 (six) hours as needed.    [provider]  albuterol (PROVENTIL HFA;VENTOLIN HFA) 108 (90 Base) MCG/ACT inhaler Inhale 2 puffs into the lungs 2 (two) times daily. 05/21/18   [provider]  allopurinol (ZYLOPRIM) 100 MG tablet Take 50-100 mg by mouth See admin instructions. Take 100 mg at 10:00 am and take 50 mg at 1800 05/15/18   [provider]  amLODipine (NORVASC) 10 MG tablet Take 10 mg by mouth at bedtime.     [provider]  aspirin EC 81 MG tablet Take 81 mg by mouth at bedtime.     [provider]  augmented betamethasone dipropionate (DIPROLENE-AF) 0.05 % ointment Apply 1 application topically 2 (two) times daily. On the back of his head 09/23/17   [provider]  calcitRIOL (ROCALTROL) 0.5 MCG capsule Take 1 mcg by mouth every Monday, Wednesday, and Friday. 06/23/18   [provider]  cloNIDine (CATAPRES) 0.2  MG tablet Take 1 tablet (0.2 mg total) by mouth 2 (two) times daily. 07/26/18   Rai, Ripudeep K, MD  cloNIDine (CATAPRES) 0.3 MG tablet Take 0.5 tablets (0.15 mg total) by mouth 2 (two) times daily. 07/26/18   Rai, Vernelle Emerald, MD  docusate sodium (COLACE) 100 MG capsule Take 1 capsule (100 mg total) by mouth daily. 07/26/18   Rai, Ripudeep K, MD  fluticasone (FLONASE) 50 MCG/ACT nasal spray Place 1 spray into both nostrils 2 (two) times daily.    [provider]  furosemide (LASIX) 80 MG tablet Take 1 tablet (80 mg total) by mouth 2 (two) times daily. 07/26/18   Rai, Vernelle Emerald, MD  hydrALAZINE (APRESOLINE) 100 MG tablet Take 100 mg by mouth 3 (three) times daily. Must take takes at 0900, 1300, and 2200 to prevent drop in the blood pressure    [provider]  isosorbide mononitrate (IMDUR) 60 MG 24 hr tablet Take 1 tablet (60 mg total) by mouth daily. 07/26/18   Rai, Ripudeep K, MD  labetalol (NORMODYNE) 200 MG tablet Take 400 mg by mouth 2 (two) times daily.     [provider]  latanoprost (XALATAN) 0.005 % ophthalmic solution Place 1 drop into the left eye at bedtime.    [provider]  montelukast (SINGULAIR) 10 MG tablet Take 10 mg by mouth at bedtime.  10/09/17   [provider]  Polyethyl Glycol-Propyl Glycol 0.4-0.3 % SOLN Place 1 drop into the right eye at bedtime.    [provider]  polyethylene glycol (MIRALAX / GLYCOLAX) packet Take 17 g by mouth as needed. 05/01/18   [provider]  pravastatin (PRAVACHOL) 40 MG tablet Take 40 mg by mouth daily.    [provider]  sodium bicarbonate 325 MG tablet Take 325 mg by mouth at bedtime.    [provider]  tamsulosin (FLOMAX) 0.4 MG CAPS capsule Take 0.4 mg by mouth See admin instructions. Take at 1400    [provider]  traMADol (ULTRAM) 50 MG tablet Take 50 mg by mouth every 6 (six) hours as needed for moderate pain.    [provider]    Family  History Family History  Problem Relation Age of Onset  . Diabetes Brother     Social History Social History   Tobacco Use  . Smoking status: Former Research scientist (life sciences)  . Smokeless tobacco: Never Used  Substance Use Topics  . Alcohol use: No  . Drug use: No     Allergies   Flu virus vaccine; Fosinopril sodium; Hydrocodone-acetaminophen; Meloxicam; and Valsartan   Review of Systems Review of Systems  All other systems reviewed and are negative.    Physical Exam Updated Vital Signs BP 124/77   Pulse 78   Temp 97.8 F (36.6 C) (Oral)   Resp (!) 22   Ht 6' (1.829 m)   Wt 93 kg   SpO2 98%   BMI  27.80 kg/m   Physical Exam Vitals signs and nursing note reviewed.  Constitutional:      Appearance: He is well-developed.  HENT:     Head: Normocephalic and atraumatic.  Neck:     Musculoskeletal: Normal range of motion.  Cardiovascular:     Rate and Rhythm: Normal rate and regular rhythm.     Heart sounds: Normal heart sounds.  Pulmonary:     Effort: Pulmonary effort is normal. No respiratory distress.     Breath sounds: Normal breath sounds.  Abdominal:     General: There is no distension.     Palpations: Abdomen is soft.     Tenderness: There is no abdominal tenderness.  Genitourinary:    Comments: Brown stool.  Hemoccult positive Musculoskeletal: Normal range of motion.  Skin:    General: Skin is warm and dry.  Neurological:     Mental Status: He is alert and oriented to person, place, and time.  Psychiatric:        Judgment: Judgment normal.      ED Treatments / Results  Labs (all labs ordered are listed, but only abnormal results are displayed) Labs Reviewed  CBC - Abnormal; Notable for the following components:      Result Value   RBC 2.27 (*)    Hemoglobin 6.6 (*)    HCT 21.4 (*)    Platelets 125 (*)    All other components within normal limits  COMPREHENSIVE METABOLIC PANEL - Abnormal; Notable for the following components:   Chloride 95 (*)     Glucose, Bld 170 (*)    BUN 143 (*)    Creatinine, Ser 5.61 (*)    ALT 56 (*)    GFR calc non Af Amer 8 (*)    GFR calc Af Amer 10 (*)    Anion gap 18 (*)    All other components within normal limits  LIPASE, BLOOD - Abnormal; Notable for the following components:   Lipase 56 (*)    All other components within normal limits  TROPONIN I - Abnormal; Notable for the following components:   Troponin I 0.58 (*)    All other components within normal limits  POC OCCULT BLOOD, ED - Abnormal; Notable for the following components:   Fecal Occult Bld POSITIVE (*)    All other components within normal limits  TYPE AND SCREEN    EKG EKG Interpretation  Date/Time:  Wednesday December 09 2018 08:19:43 EDT Ventricular Rate:  77 PR Interval:    QRS Duration: 110 QT Interval:  437 QTC Calculation: 495 R Axis:   29 Text Interpretation:  Sinus rhythm Atrial premature complex Prolonged PR interval LVH with secondary repolarization abnormality Anterior ST elevation, probably due to LVH Borderline prolonged QT interval No significant change was found Confirmed by Jola Schmidt [phone]) on 12/09/2018 8:25:10 AM   Radiology Dg Chest Portable 1 View  Result Date: 12/09/2018 CLINICAL DATA:  Chest pain EXAM: PORTABLE CHEST 1 VIEW COMPARISON:  July 22, 2018 FINDINGS: There is cardiomegaly with mild pulmonary venous hypertension. There is interstitial edema in the lung bases. There is no appreciable airspace consolidation. There is aortic atherosclerosis. No adenopathy. No bone lesions. IMPRESSION: Pulmonary vascular congestion with bibasilar interstitial edema. Suspect a degree of congestive heart failure. No consolidation. Aortic Atherosclerosis (ICD10-I70.0). Electronically Signed   By: Lowella Grip III M.D.   On: 12/09/2018 08:44    Procedures .Critical Care Performed by: Jola Schmidt, MD Authorized by: Jola Schmidt, MD   Critical  care provider statement:    Critical care time (minutes):  35    Critical care was time spent personally by me on the following activities:  Discussions with consultants, evaluation of patient's response to treatment, examination of patient, ordering and performing treatments and interventions, ordering and review of laboratory studies, ordering and review of radiographic studies, pulse oximetry, re-evaluation of patient's condition, obtaining history from patient or surrogate and review of old charts   (including critical care time)  Medications Ordered in ED Medications  fentaNYL (SUBLIMAZE) injection 50 mcg (50 mcg Intravenous Given 12/09/18 0859)     Initial Impression / Assessment and Plan / ED Course  I have reviewed the triage vital signs and the nursing notes.  Pertinent labs & imaging results that were available during my care of the patient were reviewed by me and considered in my medical decision making (see chart for details).        New anemia.  This is trending down from his baseline.  His hemoglobin is less than 7 and asymptomatic with CHF and shortness of breath.  He will be transfused 1 unit packed red blood cells.  Subtle changes on EKG and elevated troponin in the setting of chest discomfort with radiation to left shoulder.  Not a great candidate for heart catheterization given his chronic kidney disease.  Cardiology has evaluate the patient the bedside and agrees with treatment of congestive heart failure and transfusion with packed red blood cells at this time.  At this time they will follow along with the admitting team.  They do not think the patient will benefit from acute heart catheterization and believe the patient should be treated for congestive heart failure.  There is suspicion for pulmonary embolism is low.  They agree with bedside echocardiogram to evaluate both LV function.  This also will allow evaluation of the right ventricle and if dilated the patient may benefit from additional work-up in regards to the possibility of  pulmonary embolism.  Currently in the setting of congestive heart failure and Hemoccult positive stool will need a blood transfusion the patient is not a candidate for anticoagulation.  Will await echocardiogram.  Cardiology: Dr Martin[facility] Final Clinical Impressions(s) / ED Diagnoses   Final diagnoses:  Acute on chronic congestive heart failure, unspecified heart failure type ([place])  Hypoxia  Anemia, unspecified type  Shortness of breath    ED Discharge Orders    None       Jola Schmidt, MD 12/12/18 [phone]

## 2018-12-09 NOTE — ED Notes (Signed)
Cardiology at bedside.

## 2018-12-09 NOTE — ED Notes (Signed)
ED TO INPATIENT HANDOFF REPORT  ED Nurse Name and Phone #: Ardelle Park RN [phone]  S Name/Age/Gender [name] [name] 85 y.o. male Room/Bed: 031C/031C  Code Status   Code Status: Full Code  Home/SNF/Other Home Patient oriented to: situation Is this baseline? Yes   Triage Complete: Triage complete  Chief Complaint Chest Pain; Abnormal EKG  Triage Note Pt arrives to ED from home with complaints of left sided chest pain that radiates to his left arm since yesterday evening. Pt awoke this morning with continued chest pain and shortness of breath. Pt has hx of CHF and has gained six pounds over the last three days. EMS reports pt was found to have O2 at 90% on room air and placed pt on 3L [place]. Pt received 234 aspirin and nitro x2 via EMS. Chest pain has eased since.    Allergies Allergies  Allergen Reactions  . Flu Virus Vaccine Anaphylaxis    FLU VACCINATION  . Fosinopril Sodium Other (See Comments)    Unknown   . Hydrocodone-Acetaminophen     Other reaction(s): GI Upset (intolerance)  . Meloxicam     REACTION: stomah pains, nausea, constipation  . Valsartan Other (See Comments)    Other reaction(s): Other (See Comments) DIOVAN, UNKNOWN - takes losartan at home unknown    Level of Care/Admitting Diagnosis ED Disposition    ED Disposition Condition [place]: [place] [[id number]]  Level of Care: Telemetry Cardiac [103]  Covid Evaluation: N/A  Diagnosis: CHF (congestive heart failure) [facility]) [[id number]]  Admitting Physician: Sherene Sires [[id number]]  Attending Physician: Zenia Resides [[id number]]  Estimated length of stay: past midnight tomorrow  Certification:: I certify this patient will need inpatient services for at least 2 midnights  PT Class (Do Not Modify): Inpatient [101]  PT Acc Code (Do Not Modify): Private [1]       B Medical/Surgery History Past Medical History:  Diagnosis Date  . CHF (congestive heart failure)  ([place])   . CKD (chronic kidney disease), stage IV ([place]) 10/25/2017  . Diabetes mellitus without complication ([place])   . Hyperlipidemia   . Hypertension   . Stroke [facility])    2009   Past Surgical History:  Procedure Laterality Date  . NECK SURGERY     lump removed  . REPLACEMENT TOTAL KNEE     Left and Right     A IV Location/Drains/Wounds Patient Lines/Drains/Airways Status   Active Line/Drains/Airways    Name:   Placement date:   Placement time:   Site:   Days:   Peripheral IV 12/09/18 Right Antecubital   12/09/18    0828    Antecubital   less than 1          Intake/Output Last 24 hours No intake or output data in the 24 hours ending 12/09/18 1306  Labs/Imaging Results for orders placed or performed during the hospital encounter of 12/09/18 (from the past 48 hour(s))  CBC     Status: Abnormal   Collection Time: 12/09/18  8:27 AM  Result Value Ref Range   WBC 6.1 4.0 - 10.5 K/uL   RBC 2.27 (L) 4.22 - 5.81 MIL/uL   Hemoglobin 6.6 (LL) 13.0 - 17.0 g/dL    Comment: REPEATED TO VERIFY THIS CRITICAL RESULT HAS VERIFIED AND BEEN CALLED TO L. Jerzey Komperda, RN BY JULIE MACEDA DEL ANGEL ON 04 22 2020 AT 0854, AND HAS BEEN READ BACK.     HCT 21.4 (L) 39.0 -  52.0 %   MCV 94.3 80.0 - 100.0 fL   MCH 29.1 26.0 - 34.0 pg   MCHC 30.8 30.0 - 36.0 g/dL   RDW 15.2 11.5 - 15.5 %   Platelets 125 (L) 150 - 400 K/uL   nRBC 0.0 0.0 - 0.2 %    Comment: Performed at [facility] [address] [place], [place] [address]  Comprehensive metabolic panel     Status: Abnormal   Collection Time: 12/09/18  8:27 AM  Result Value Ref Range   Sodium 137 135 - 145 mmol/L   Potassium 4.7 3.5 - 5.1 mmol/L   Chloride 95 (L) 98 - 111 mmol/L   CO2 24 22 - 32 mmol/L   Glucose, Bld 170 (H) 70 - 99 mg/dL   BUN 143 (H) 8 - 23 mg/dL   Creatinine, Ser 5.61 (H) 0.61 - 1.24 mg/dL   Calcium 9.5 8.9 - 10.3 mg/dL   Total Protein 6.9 6.5 - 8.1 g/dL   Albumin 3.6 3.5 - 5.0 g/dL   AST 40 15 - 41 U/L   ALT 56 (H)  0 - 44 U/L   Alkaline Phosphatase 46 38 - 126 U/L   Total Bilirubin 0.9 0.3 - 1.2 mg/dL   GFR calc non Af Amer 8 (L) >60 mL/min   GFR calc Af Amer 10 (L) >60 mL/min   Anion gap 18 (H) 5 - 15    Comment: Performed at [facility], [place] [address], [place], [place] [address]  Lipase, blood     Status: Abnormal   Collection Time: 12/09/18  8:27 AM  Result Value Ref Range   Lipase 56 (H) 11 - 51 U/L    Comment: Performed at [facility] [address] [place], [place] [address]  Troponin I - ONCE - STAT     Status: Abnormal   Collection Time: 12/09/18  8:27 AM  Result Value Ref Range   Troponin I 0.58 (HH) <0.03 ng/mL    Comment: CRITICAL RESULT CALLED TO, READ BACK BY AND VERIFIED WITH: Joesph Fillers RN [phone] BY A BENNETT Performed at [facility], [place] [address], [place], [place] [address]   POC occult blood, ED     Status: Abnormal   Collection Time: 12/09/18  9:24 AM  Result Value Ref Range   Fecal Occult Bld POSITIVE (A) NEGATIVE  Type and screen [place]     Status: None (Preliminary result)   Collection Time: 12/09/18  9:25 AM  Result Value Ref Range   ABO/RH(D) AB POS    Antibody Screen NEG    Sample Expiration 12/12/2018    Unit Number [id number]    Blood Component Type RED CELLS,LR    Unit division 00    Status of Unit ISSUED    Transfusion Status OK TO TRANSFUSE    Crossmatch Result      Compatible Performed at [facility], [place] [address] [place], [place] [address]   ABO/Rh     Status: None   Collection Time: 12/09/18  9:25 AM  Result Value Ref Range   ABO/RH(D)      AB POS Performed at [facility] [address], [place], [place] [address]   Prepare RBC     Status: None   Collection Time: 12/09/18 10:45 AM  Result Value Ref Range   Order Confirmation      ORDER PROCESSED BY BLOOD BANK Performed at [facility], [place] [address], [place], [place] [address]    Dg Chest Portable 1  View  Result Date: 12/09/2018 CLINICAL DATA:  Chest pain EXAM: PORTABLE CHEST 1 VIEW COMPARISON:  July 22, 2018 FINDINGS: There is cardiomegaly with mild pulmonary venous hypertension. There is interstitial edema in the lung bases. There is no appreciable airspace consolidation. There is aortic atherosclerosis. No adenopathy. No bone lesions. IMPRESSION: Pulmonary vascular congestion with bibasilar interstitial edema. Suspect a degree of congestive heart failure. No consolidation. Aortic Atherosclerosis (ICD10-I70.0). Electronically Signed   By: Lowella Grip III M.D.   On: 12/09/2018 08:44    Pending Labs Unresulted Labs (From admission, onward)    Start     Ordered   12/10/18 0500  Hemoglobin A1c  Tomorrow morning,   R     12/09/18 1304   12/10/18 0500  Comprehensive metabolic panel  Daily,   R     12/09/18 1304   12/10/18 0500  CBC  Daily,   R     12/09/18 1304   12/09/18 1304  Troponin I - Now Then Q6H  Now then every 6 hours,   R     12/09/18 1304          Vitals/Pain Today's Vitals   12/09/18 1303 12/09/18 1303 12/09/18 1304 12/09/18 1306  BP: 108/71 108/71    Pulse: 72 71    Resp: 15 18    Temp: 97.7 F (36.5 C) (!) 97.5 F (36.4 C)    TempSrc:  Oral    SpO2: 96% 95%    Weight:      Height:      PainSc:   8  8     Isolation Precautions No active isolations  Medications Medications  0.9 %  sodium chloride infusion (Manually program via Guardrails IV Fluids) (has no administration in time range)  amLODipine (NORVASC) tablet 10 mg (has no administration in time range)  hydrALAZINE (APRESOLINE) tablet 50 mg (has no administration in time range)  docusate sodium (COLACE) capsule 100 mg (has no administration in time range)  polyethylene glycol (MIRALAX / GLYCOLAX) packet 17 g (has no administration in time range)  sodium bicarbonate tablet 325 mg (has no administration in time range)  tamsulosin (FLOMAX) capsule 0.4 mg (has no administration in time range)   fluticasone (FLONASE) 50 MCG/ACT nasal spray 1 spray (has no administration in time range)  montelukast (SINGULAIR) tablet 10 mg (has no administration in time range)  latanoprost (XALATAN) 0.005 % ophthalmic solution 1 drop (has no administration in time range)  Polyethyl Glycol-Propyl Glycol 0.4-0.3 % SOLN 1 drop (has no administration in time range)  sodium chloride flush (NS) 0.9 % injection 3 mL (has no administration in time range)  insulin aspart (novoLOG) injection 0-9 Units (has no administration in time range)  fentaNYL (SUBLIMAZE) injection 50 mcg (50 mcg Intravenous Given 12/09/18 0859)  furosemide (LASIX) injection 60 mg (60 mg Intravenous Given 12/09/18 1053)    Mobility walks Low fall risk   Focused Assessments Pulmonary Assessment Handoff:  Lung sounds: Bilateral Breath Sounds: Diminished L Breath Sounds: Diminished, Coarse crackles R Breath Sounds: Coarse crackles, Diminished O2 Device: Nasal Cannula O2 Flow Rate (L/min): 3 L/min      R Recommendations: See Admitting Provider Note  Report given to:   Additional Notes: Pt lives with daughter, her number is in chart. She makes the medical decisions for the pt.

## 2018-12-09 NOTE — ED Notes (Signed)
Dr. Campos at bedside   

## 2018-12-09 NOTE — ED Notes (Signed)
Consent for blood transfusion obtained from daughter over the phone, admitting at bedside for second consent.

## 2018-12-09 NOTE — Consult Note (Addendum)
Cardiology Consultation:   Patient ID: CORDARRO SPINNATO MRN: [id number]; DOB: Jan 18, 1929  Admit date: 12/09/2018 Date of Consult: 12/09/2018  Primary Care Provider: System, Pcp Not In Primary Cardiologist: O'NEILL, Gwyneth Sprout, MD [place] Primary Electrophysiologist:  None    Patient Profile:   [name] [name] is a 85 y.o. male with a hx of chronic diastolic HF with normal EF 65% in 2016, CKD5 with no intention of dialysis per chart, resistant HTN, DM-2, HLD and anemia with blood transfusions who is being seen today for the evaluation of chest pain and CHF with wt gain, now presenting to ER at the request of Dr. Venora Maples.  History of Present Illness:   [name] [name] has hx as above dating back to 2016 with HFpEF heart failure.  More recent echo 07/2018 with EF 50-55% and moderate AS and mild AR.  Valve area (VTI): 1.08 cm^2. Valve area (Vmax): 1.18 cm^2. Valve area (Vmean): 1.16 cm^2.   Mitral valve with Severely calcified annulus. Moderately thickened, mildly calcified leaflets . There was mild regurgitation. Valve area by continuity equation (using LVOT flow): 2.1 cm^2.  LA severely dilated.  He did have nuc study in 2016 and was normal.    Chronic diastolic HF with BNP 892 08/19/92.   Pt has CKD 5 so has not been bale to take ACE/ARB/Spironolactone.  Also with hyperkalemia when he was on meds.  Pt has refused dialysis.  On 10/21/18 BUN 105   He has been anemic and followed by hematology and thought was due to low grade MDS.  He was supposed to receive Aranesp but has had difficulty receiving due to COVID restrictions.   Due to age they had not done big workup.  Previous labs 10/21/18 WBC 3.3, RBC 2.96, and Hgb 9.1 plts 98k.   It appears he was transfused 10/09/18. 2 units when hgb was 6.5..    From his [facility] cardiology note of 10/21/18 he is a DNR.   Though here in December he is full code.      Now presenting with chest pain and acute CHF with SOB.  Over last few days he has had lt sided  chest pain and SOB.  He is obviously SOB on phone.  His wt is up as well.  He has not had aranesp injections recently.    EKG:  The EKG was personally reviewed and demonstrates:  SR with LVH and some Ant ST elevation and deep T wave inversion lat leads, similar to EKG 07/2018.  Hgb 6.6 and Hct 21.4, WBC 6.1, plts 125 Troponin 0.58 Na 137 K+ 4.7, BUN 143 Cr 5.61  Heme + stools,   CXR 1 V:  Pulmonary vascular congestion with bibasilar interstitial edema.  Suspect a degree of congestive heart failure. No consolidation. Aortic Atherosclerosis (ICD10-I70.0).  Pt has been typed and crossed.  BP 114/70 to 126/80.  Afebrile.  And SR   Past Medical History:  Diagnosis Date  . CHF (congestive heart failure) ([place])   . CKD (chronic kidney disease), stage IV ([place]) 10/25/2017  . Diabetes mellitus without complication ([place])   . Hyperlipidemia   . Hypertension   . Stroke [facility])    2009    Past Surgical History:  Procedure Laterality Date  . NECK SURGERY     lump removed  . REPLACEMENT TOTAL KNEE     Left and Right     Home Medications:  Prior to Admission medications   Medication Sig Start Date End Date Taking? Authorizing  Provider  acetaminophen (TYLENOL) 500 MG tablet Take 1,000 mg by mouth every 6 (six) hours as needed.    [provider]  albuterol (PROVENTIL HFA;VENTOLIN HFA) 108 (90 Base) MCG/ACT inhaler Inhale 2 puffs into the lungs 2 (two) times daily. 05/21/18   [provider]  allopurinol (ZYLOPRIM) 100 MG tablet Take 50-100 mg by mouth See admin instructions. Take 100 mg at 10:00 am and take 50 mg at 1800 05/15/18   [provider]  amLODipine (NORVASC) 10 MG tablet Take 10 mg by mouth at bedtime.     [provider]  aspirin EC 81 MG tablet Take 81 mg by mouth at bedtime.     [provider]  augmented betamethasone dipropionate (DIPROLENE-AF) 0.05 % ointment Apply 1 application topically 2 (two) times daily. On the back of his head 09/23/17    [provider]  calcitRIOL (ROCALTROL) 0.5 MCG capsule Take 1 mcg by mouth every Monday, Wednesday, and Friday. 06/23/18   [provider]  cloNIDine (CATAPRES) 0.2 MG tablet Take 1 tablet (0.2 mg total) by mouth 2 (two) times daily. 07/26/18   Rai, Ripudeep K, MD  cloNIDine (CATAPRES) 0.3 MG tablet Take 0.5 tablets (0.15 mg total) by mouth 2 (two) times daily. 07/26/18   Rai, Vernelle Emerald, MD  docusate sodium (COLACE) 100 MG capsule Take 1 capsule (100 mg total) by mouth daily. 07/26/18   Rai, Ripudeep K, MD  fluticasone (FLONASE) 50 MCG/ACT nasal spray Place 1 spray into both nostrils 2 (two) times daily.    [provider]  furosemide (LASIX) 80 MG tablet Take 1 tablet (80 mg total) by mouth 2 (two) times daily. 07/26/18   Rai, Vernelle Emerald, MD  hydrALAZINE (APRESOLINE) 100 MG tablet Take 100 mg by mouth 3 (three) times daily. Must take takes at 0900, 1300, and 2200 to prevent drop in the blood pressure    [provider]  isosorbide mononitrate (IMDUR) 60 MG 24 hr tablet Take 1 tablet (60 mg total) by mouth daily. 07/26/18   Rai, Ripudeep K, MD  labetalol (NORMODYNE) 200 MG tablet Take 400 mg by mouth 2 (two) times daily.     [provider]  latanoprost (XALATAN) 0.005 % ophthalmic solution Place 1 drop into the left eye at bedtime.    [provider]  montelukast (SINGULAIR) 10 MG tablet Take 10 mg by mouth at bedtime.  10/09/17   [provider]  Polyethyl Glycol-Propyl Glycol 0.4-0.3 % SOLN Place 1 drop into the right eye at bedtime.    [provider]  polyethylene glycol (MIRALAX / GLYCOLAX) packet Take 17 g by mouth as needed. 05/01/18   [provider]  pravastatin (PRAVACHOL) 40 MG tablet Take 40 mg by mouth daily.    [provider]  sodium bicarbonate 325 MG tablet Take 325 mg by mouth at bedtime.    [provider]  tamsulosin (FLOMAX) 0.4 MG CAPS capsule Take 0.4 mg by mouth See admin  instructions. Take at 1400    [provider]  traMADol (ULTRAM) 50 MG tablet Take 50 mg by mouth every 6 (six) hours as needed for moderate pain.    [provider]    Inpatient Medications: Scheduled Meds:  Continuous Infusions:  PRN Meds:   Allergies:    Allergies  Allergen Reactions  . Flu Virus Vaccine Anaphylaxis    FLU VACCINATION  . Fosinopril Sodium Other (See Comments)    Unknown   . Hydrocodone-Acetaminophen  Other reaction(s): GI Upset (intolerance)  . Meloxicam     REACTION: stomah pains, nausea, constipation  . Valsartan Other (See Comments)    Other reaction(s): Other (See Comments) DIOVAN, UNKNOWN - takes losartan at home unknown    Social History:   Social History   Socioeconomic History  . Marital status: Married    Spouse name: Not on file  . Number of children: Not on file  . Years of education: Not on file  . Highest education level: Not on file  Occupational History  . Not on file  Social Needs  . Financial resource strain: Not on file  . Food insecurity:    Worry: Not on file    Inability: Not on file  . Transportation needs:    Medical: Not on file    Non-medical: Not on file  Tobacco Use  . Smoking status: Former Research scientist (life sciences)  . Smokeless tobacco: Never Used  Substance and Sexual Activity  . Alcohol use: No  . Drug use: No  . Sexual activity: Not on file  Lifestyle  . Physical activity:    Days per week: Not on file    Minutes per session: Not on file  . Stress: Not on file  Relationships  . Social connections:    Talks on phone: Not on file    Gets together: Not on file    Attends religious service: Not on file    Active member of club or organization: Not on file    Attends meetings of clubs or organizations: Not on file    Relationship status: Not on file  . Intimate partner violence:    Fear of current or ex partner: Not on file    Emotionally abused: Not on file    Physically abused: Not on file     Forced sexual activity: Not on file  Other Topics Concern  . Not on file  Social History Narrative  . Not on file    Family History:    Family History  Problem Relation Age of Onset  . Diabetes Brother      ROS:  Please see the history of present illness.  General:no colds or fevers, + weight up 6 lbs Skin:no rashes or ulcers HEENT:no blurred vision, no congestion CV:see HPI PUL:see HPI GI:no diarrhea constipation or melena, no indigestion GU:no hematuria, no dysuria MS:no joint pain, no claudication Neuro:no syncope, no lightheadedness  Hx of CVA Endo:+ diabetes, no thyroid disease Heme, chromic anemia last transfused 10/09/18   All other ROS reviewed and negative.     Physical Exam/Data:   Vitals:   12/09/18 0845 12/09/18 0900 12/09/18 0915 12/09/18 0930  BP: 119/72 114/70 122/72 124/77  Pulse: 81 72 76 78  Resp: 16 16 17  (!) 22  Temp:      TempSrc:      SpO2: 97% 99% 99% 98%  Weight:      Height:       No intake or output data in the 24 hours ending 12/09/18 1002 Last 3 Weights 12/09/2018 07/26/2018 07/25/2018  Weight (lbs) 205 lb 196 lb 6.4 oz 199 lb 6.4 oz  Weight (kg) 92.987 kg 89.086 kg 90.447 kg     Body mass index is 27.8 kg/m.  Per Dr [place] General:  Well nourished, well developed, mild acute distress still with chest pain and SOB  HEENT: normal Lymph: no adenopathy Neck: + JVD Endocrine:  No thryomegaly Vascular: No carotid bruits; FA pulses 2+ bilaterally without bruits  Cardiac:  normal S1, S2; RRR; + murmur  Lungs:  diminished breath sounds to auscultation bilaterally, no wheezing, rhonchi + rales  Abd: soft, nontender, no hepatomegaly  Ext: no edema Musculoskeletal:  No deformities, BUE and BLE strength normal and equal Skin: warm and dry  Neuro:  CNs 2-12 intact, no focal abnormalities noted Psych:  Normal affect   Relevant CV Studies: Echo 2019  Study Conclusions  - Left ventricle: The cavity size was moderately dilated. Wall    thickness was increased in a pattern of moderate LVH. Systolic   function was normal. The estimated ejection fraction was in the   range of 50% to 55%. The study is not technically sufficient to   allow evaluation of LV diastolic function. - Aortic valve: There was moderate stenosis. There was mild   regurgitation. Valve area (VTI): 1.08 cm^2. Valve area (Vmax):   1.18 cm^2. Valve area (Vmean): 1.16 cm^2. - Mitral valve: Severely calcified annulus. Moderately thickened,   mildly calcified leaflets . There was mild regurgitation. Valve   area by continuity equation (using LVOT flow): 2.1 cm^2. - Left atrium: The atrium was severely dilated. - Atrial septum: No defect or patent foramen ovale was identified.   Laboratory Data:  Chemistry Recent Labs  Lab 12/09/18 0827  NA 137  K 4.7  CL 95*  CO2 24  GLUCOSE 170*  BUN 143*  CREATININE 5.61*  CALCIUM 9.5  GFRNONAA 8*  GFRAA 10*  ANIONGAP 18*    Recent Labs  Lab 12/09/18 0827  PROT 6.9  ALBUMIN 3.6  AST 40  ALT 56*  ALKPHOS 46  BILITOT 0.9   Hematology Recent Labs  Lab 12/09/18 0827  WBC 6.1  RBC 2.27*  HGB 6.6*  HCT 21.4*  MCV 94.3  MCH 29.1  MCHC 30.8  RDW 15.2  PLT 125*   Cardiac Enzymes Recent Labs  Lab 12/09/18 0827  TROPONINI 0.58*   No results for input(s): TROPIPOC in the last 168 hours.  BNPNo results for input(s): BNP, PROBNP in the last 168 hours.  DDimer No results for input(s): DDIMER in the last 168 hours.  Radiology/Studies:  Dg Chest Portable 1 View  Result Date: 12/09/2018 CLINICAL DATA:  Chest pain EXAM: PORTABLE CHEST 1 VIEW COMPARISON:  July 22, 2018 FINDINGS: There is cardiomegaly with mild pulmonary venous hypertension. There is interstitial edema in the lung bases. There is no appreciable airspace consolidation. There is aortic atherosclerosis. No adenopathy. No bone lesions. IMPRESSION: Pulmonary vascular congestion with bibasilar interstitial edema. Suspect a degree of  congestive heart failure. No consolidation. Aortic Atherosclerosis (ICD10-I70.0). Electronically Signed   By: Lowella Grip III M.D.   On: 12/09/2018 08:44    Assessment and Plan:   1. Chest pain with mild elevation in Troponin though EKG is stable with LVH.  Most likely demand ischemia due to acute blood loss anemia.  Dr. [place] to see.  Would not due ischemic workup due to CKD, would be unable to cath.  Treat medically. 2. Acute on chronic diastolic HF and mod AS, last Echo 07/2018 - would not repeat Echo at this time - with transfusion would give IV lasix. 3. GI bleed- has had issues for some time, followed [facility] at [facility].  Transfused in Feb 2020, has not been able to receive his Araransp injections. Agree with transfusion.    4. CKD-5 chronic and pt has refused dialysis.  5. Moderate AS last evaluated 07/2018 6. HTN  Cannot be on ACE/ARB or spironolactone due  to CKD continue current meds.  7. Pt per [facility] has been DNR,        For questions or updates, please contact [place] Please consult [email or web address] for contact info under     Signed, Cecilie Kicks, NP  12/09/2018 10:02 AM Patient seen and examined and history reviewed. Agree with above findings and plan. 85 yo BM with history of diastolic CHF, moderate AS, stage V CKD, anemia with MDS, HTN presents with symptoms of increased SOB and left sided anterior chest pain. Has not felt well for the past several days. Notes 6 lb weight gain in last 3 days. Increased LE edema. Yesterday had left sided chest pain that later resolved. Pain returned today and has not resolved. Pain is worse with deep breathing. Denies N/V, cough, fever, diarrhea or change in bowel habits. Feels week. No dizziness or syncope.   On exam he is SOB at rest. + JVD. Lungs with coarse bilateral rales.  CV RRR with gr 2/6 harsh systolic murmur RUSB Abd soft NT Ext: 2+ edema.  Neuro alert and oriented. CN intact.   Ecg shows NSR with LVH and  repolarization abnormality versus anterolateral ischemia. I have personally reviewed and interpreted this study. CXR shows pulmonary edema Hgb 6.6 Heme + stool BUN 143, creatinine 5.6. Troponin 0.58  Impression: 1. Acute on chronic diastolic CHF. Significantly volume overloaded with pulmonary and LE edema. Exacerbated by CKD and anemia. Will need IV diuresis. I would not repeat Echo at this time since he just had one in December and repeat Echo will not change management. 2. Acute on chronic anemia. History of mild myelodysplasia as well as heme + stool. Needs transfusion. Goal Hgb > 8.5 3. Moderate AS 4. CKD stage V. Rising BUN related to GI bleed and CHF 5. Elevated troponin due to demand ischemia with CHF, CKD and severe anemia. Not a candidate for invasive evaluation. 6. Chest pain. Mostly secondary to demand ischemia and CHF. I think risk of PE is low and he is at any rate not a candidate for anticoagulation  Overall his prognosis is poor and I feel treatment is palliative at this point. Need to readdress end of life wishes.   Taleeya Blondin [place], [place] 12/09/2018 1:28 PM

## 2018-12-09 NOTE — H&P (Signed)
[facility] Admission History and Physical Service Pager: [phone]  Patient name: [name] [name] Medical record number: [id number] Date of birth: November 27, 1928 Age: 85 y.o. Gender: male  Primary Care Provider: System, Pcp Not In Consultants: Cardiology, and hematology  Code Status: Full  Chief Complaint: Shortness of breath  Assessment and Plan: [name] [name] is a 85 y.o. male presenting with CHF exacerbation and chronic anemia. PMH is significant for CHF, chronic anemia CKD stage 5, OSA, hypertension , Asthma, BPH, history of DM 2, gout, ?  Glaucoma  CHF exacerbation: per daughter (medical decision maker verbally per patient) patient takes 80mg  lasix BID and has good urine output with this.  He has a goal fluid restriction of 2L but regualrly goes over.  He weighs daily and she says she thinks he is only up 6-8lbs.  She says she notices only mildly increased swelling.  Good reported response to IV lasix in Ed at 11am.  No IWB on 3L [place] and was satting 93.  There was an elevated troponin to 0.58, ecg was reveiwed by cardiology and found to have chronic ischemic changes, patient is not a cath candidate. -Admit to cardiac tele, Dr. Andria Frames  -consult cardiology, appreciate recs -lasix 100mg  IV BID -trend troponin -Oxygen as needed to keep sats over 92 -strict I/os -daily weights  Chronic Anemia likely 2/2 Low grade myelodisplastic vs ckd: 6.6 on admission, baseline appears to be mid sevens.  Follows with [facility] hematology on outpatient basis.  We called him and they said the use Aranesp and transfusions in patients that have refused bone marrow.   They recommend Aranesp at this time and transfusion if required.  Patient did have positive FOBT report was that stool was dark, discuss with the patient that his lab values are consistent with his chronic issues but that a potential bleed was concerning.  He said that he felt it was required he would consider a  colonoscopy but was not sure if he would with surgery if that was required to fix the problem -1 unit transfusion ordered by ED, we did not test bit more than this both for reasons of fluid overload and baseline hemoglobin being mid sevens -Avoid anticoagulation -Every 4 vitals -Daily CBC -We will discuss Aranesp with nephro -We will discuss limited utility of potential colonoscopy in patient with chronic issues that explain his lab value and likely refusal of surgical intervention even if he is deemed a candidate  Ckd5: Patient is still producing urine, uses home dose of 80 Lasix twice daily and says he has been "good urine production "with this dose.  The past has refused hemodialysis and at this point is still not wanting this at access.  He said that he would consider it only in [place] life or death immediate emergency.  Does seem to be on sodium bicarb 325 daily -We will consult nephro for question about potential Aranesp administration during this admission related to above anemia and any advice on managing this ESRD patient  HTN: wnl limits on admission, believed ot be compliant on home meds.  On home amlodipine, hydralazine 100 3 times daily -q4 vitals -home alodipine -We will start hydralazine AT 50 3 times daily, and increase if needed  Chronic constipation: had BM in ED, fecal occult +, denies any bowel symptoms or changes.  Says he is regular with his constipation medicine -Continue home Colace, MiraLAX  History of asthma: No wheezing on exam, no complaints of recent asthma  attacks.  My exam showed crackles. -Every 2 as needed at home inhaler albuterol -Continue home montelukast -Patient is on oxygen supplementation as needed to keep saturations above 92  History of DM 2: Last A1c on chart was 5.4.  Most recent CBGs have been in the mid 120s 170 on admission.  Is not on insulin on an outpatient basis -Every 4 CBGs and sliding scale insulin -A.m. hemoglobin A1c  ?  Glaucoma:  Patient is prescribed outpatient latanoprost eyedrops, did not mention this on exam mission. -We will order first eyedrops and discussed with patient next time we see him  BPH: Patient with known history of BPH, and is on home Flomax.  Says he has still with his home doses of Lasix.  Denies any dysuria or urinary symptoms at this time. -Continue home Flomax  Gout: Does have home dose of allopurinol, there is no sign of gout flare -Allopurinol 100 DIALY  FEN/GI: Heart healthy diet with fluid restriction to 1500 Prophylaxis: SCDs given potential bleed  Disposition: Cardiac telemetry pending CHF resolution  History of Present Illness:  [name] [name] is a 85 y.o. male presenting with shortness of breath.  His daughter is Education officer, community, and his interview was conducted with her on speaker phone in the room with the patient's consent.  He has a long-term known CHF problem, he says he gets good urine output with his twice daily dose of daily Lasix.  He weighs himself daily, his daughter tries to give him to 2 L fluid intake but he often has indiscretions.  He has not had any fever symptoms, he does have a history of asthma but is not had any wheezing symptoms or problems he considers consistent with asthma.  He says that he has been feeling "more fluid overloaded "his daughter denies seeing anything that seems significant to her above his normal baseline.  She was unable to quantify but says that she thinks his weight has been up in the range of 6 to 8 pounds.  She did not actually think anything had been wrong with him until he started complaining today that he was not breathing well.  Other issues include chronic kidney disease stage V, in the past he is always turned on hemodialysis and with the exception of a potential immediate life-threatening issue that is still his stance.  He follows with [facility] hematology oncology and gets regular doses of Aranesp as well as transfusions on  occasion.  He was apparently scheduled recently "date undetermined" to get another dose did not get to [facility] to get this done.  They are unable to quantify what condition they see them for the make note that he does have a hernia in his groin but that he just wears a belt for that and has had no bowel problems.  Said his stool has been dark but not painful he does take some chronic constipation medicine at home.  He said in the past he has been listed as DNR but they want to be listed as full code at this time.  Patient does insist that he would not want to be left on ventilation "for a long time ".  He denies any illicit substance or alcohol use.  Per him and his daughter they believe he has been fairly faithful with his medication and taking them on time as prescribed  Review Of Systems: Per HPI with the following additions:   ROS  Patient Active Problem List   Diagnosis Date Noted  .  CHF (congestive heart failure) ([place]) 12/09/2018  . Acute exacerbation of CHF (congestive heart failure) ([place]) 07/22/2018  . Volume overload 07/22/2018  . ESRD (end stage renal disease) ([place]) 07/22/2018  . Anemia of chronic disease 07/22/2018  . Gout 07/22/2018  . CVA (cerebral vascular accident) ([place]) 07/22/2018  . Physical deconditioning 07/22/2018  . CKD (chronic kidney disease), stage V ([place]) 10/26/2017  . Acute on chronic diastolic heart failure ([place]) 10/26/2017  . Right foot pain   . Hyperkalemia   . Acute on chronic diastolic CHF (congestive heart failure) ([place]) 10/25/2017  . DM (diabetes mellitus), type 2 with renal complications ([place]) 92/06/9416  . Dyslipidemia 10/25/2017  . Chest pain 09/21/2014  . AORTIC VALVE SCLEROSIS 02/24/2009  . PERIPHERAL EDEMA 11/29/2008  . THROMBOCYTOPENIA 11/25/2008  . PVD 11/25/2008  . RESTING TREMOR 11/11/2008  . BPH (benign prostatic hyperplasia) 07/04/2008  . Asthma 07/17/2007  . OBSTRUCTIVE SLEEP APNEA 06/22/2007  . OVERACTIVE BLADDER 06/15/2007  .  History of cardiovascular disorder 03/30/2007  . Essential hypertension 03/23/2007  . DEGENERATIVE JOINT DISEASE 03/23/2007    Past Medical History: Past Medical History:  Diagnosis Date  . CHF (congestive heart failure) ([place])   . CKD (chronic kidney disease), stage IV ([place]) 10/25/2017  . Diabetes mellitus without complication ([place])   . Hyperlipidemia   . Hypertension   . Stroke [facility])    2009    Past Surgical History: Past Surgical History:  Procedure Laterality Date  . NECK SURGERY     lump removed  . REPLACEMENT TOTAL KNEE     Left and Right    Social History: Social History   Tobacco Use  . Smoking status: Former Research scientist (life sciences)  . Smokeless tobacco: Never Used  Substance Use Topics  . Alcohol use: No  . Drug use: No   Additional social history: Daughter is medical decision-maker per verbal instruction by patient Please also refer to relevant sections of EMR.  Family History: Family History  Problem Relation Age of Onset  . Diabetes Brother     Allergies and Medications: Allergies  Allergen Reactions  . Flu Virus Vaccine Anaphylaxis    FLU VACCINATION  . Fosinopril Sodium Other (See Comments)    Unknown   . Hydrocodone-Acetaminophen     Other reaction(s): GI Upset (intolerance)  . Meloxicam     REACTION: stomah pains, nausea, constipation  . Valsartan Other (See Comments)    Other reaction(s): Other (See Comments) DIOVAN, UNKNOWN - takes losartan at home unknown   No current facility-administered medications on file prior to encounter.    Current Outpatient Medications on File Prior to Encounter  Medication Sig Dispense Refill  . acetaminophen (TYLENOL) 500 MG tablet Take 1,000 mg by mouth every 6 (six) hours as needed for mild pain.     Marland Kitchen albuterol (PROVENTIL HFA;VENTOLIN HFA) 108 (90 Base) MCG/ACT inhaler Inhale 2 puffs into the lungs 2 (two) times daily.    Marland Kitchen allopurinol (ZYLOPRIM) 100 MG tablet Take 150 mg by mouth daily.     Marland Kitchen amLODipine (NORVASC) 10  MG tablet Take 10 mg by mouth at bedtime.     Marland Kitchen aspirin EC 81 MG tablet Take 81 mg by mouth at bedtime.     Marland Kitchen augmented betamethasone dipropionate (DIPROLENE-AF) 0.05 % ointment Apply 1 application topically 2 (two) times daily. On the back of his head    . calcitRIOL (ROCALTROL) 0.5 MCG capsule Take 1 mcg by mouth every Monday, Wednesday, and Friday.    . cloNIDine (CATAPRES) 0.3  MG tablet Take 0.5 tablets (0.15 mg total) by mouth 2 (two) times daily. 60 tablet 3  . docusate sodium (COLACE) 100 MG capsule Take 1 capsule (100 mg total) by mouth daily. 30 capsule 0  . fluocinonide ointment (LIDEX) 8.12 % Apply 1 application topically 2 (two) times a day. To worst areas of itching.    . fluticasone (FLONASE) 50 MCG/ACT nasal spray Place 1 spray into both nostrils 2 (two) times daily.    . furosemide (LASIX) 80 MG tablet Take 1 tablet (80 mg total) by mouth 2 (two) times daily. 60 tablet 3  . hydrALAZINE (APRESOLINE) 100 MG tablet Take 100 mg by mouth 3 (three) times daily. Must take takes at 0900, 1300, and 2200 to prevent drop in the blood pressure    . isosorbide mononitrate (IMDUR) 60 MG 24 hr tablet Take 1 tablet (60 mg total) by mouth daily. 30 tablet 3  . labetalol (NORMODYNE) 200 MG tablet Take 400 mg by mouth 2 (two) times daily.     Marland Kitchen latanoprost (XALATAN) 0.005 % ophthalmic solution Place 1 drop into the left eye at bedtime.    . montelukast (SINGULAIR) 10 MG tablet Take 10 mg by mouth at bedtime.     Vladimir Faster Glycol-Propyl Glycol 0.4-0.3 % SOLN Place 1 drop into the right eye at bedtime.    . polyethylene glycol (MIRALAX / GLYCOLAX) packet Take 17 g by mouth as needed for mild constipation.     . pravastatin (PRAVACHOL) 40 MG tablet Take 40 mg by mouth daily.    . sodium bicarbonate 325 MG tablet Take 325 mg by mouth at bedtime.    . tamsulosin (FLOMAX) 0.4 MG CAPS capsule Take 0.4 mg by mouth See admin instructions. Take at 1400    . cloNIDine (CATAPRES) 0.2 MG tablet Take 1 tablet  (0.2 mg total) by mouth 2 (two) times daily. (Patient not taking: Reported on 12/09/2018) 60 tablet 2    Objective: BP 108/71   Pulse 71   Temp (!) 97.5 F (36.4 C) (Oral)   Resp 18   Ht 6' (1.829 m)   Wt 93 kg   SpO2 95%   BMI 27.80 kg/m  Exam: General: Fatigued but in no apparent distress, speaking in full sentences Eyes: Clear sclera no drainage ENTM: Moist mucous membranes, no epistaxis, no congestion noted Neck: No abnormality noted, no stridor Cardiovascular: Clear murmur noted, regular rate, 2+ pitting edema in legs bilaterally with chronic skin changes Respiratory: No increased work of breathing, no cough, no wheezing to lung auscultation but bilateral crackles through mid lung exam was on 3 L nasal cannula with patient satting 93 (he is on room air at home) Gastrointestinal: Soft belly with no significant tenderness to palpation, patient was wearing belly band this is baseline for him. MSK: No indication of gout flare, patient does have history of knee surgeries and scars are well-healed Derm: Chronic skin changes at lower extremities, extremely dry skin around feet no noted Neuro: Grossly intact with no deficits, patient was able to move around Psych: Patient was processing information appropriately and answering questions weighing pros and cons of interventions with his daughter on the phone, she says he appeared to be at his baseline.  All interactions with daughter appear to be appropriate  Labs and Imaging: CBC BMET  Recent Labs  Lab 12/09/18 0827  WBC 6.1  HGB 6.6*  HCT 21.4*  PLT 125*   Recent Labs  Lab 12/09/18 0827  NA 137  K 4.7  CL 95*  CO2 24  BUN 143*  CREATININE 5.61*  GLUCOSE 170*  CALCIUM 9.5     Dg Chest Portable 1 View  Result Date: 12/09/2018 CLINICAL DATA:  Chest pain EXAM: PORTABLE CHEST 1 VIEW COMPARISON:  July 22, 2018 FINDINGS: There is cardiomegaly with mild pulmonary venous hypertension. There is interstitial edema in the lung  bases. There is no appreciable airspace consolidation. There is aortic atherosclerosis. No adenopathy. No bone lesions. IMPRESSION: Pulmonary vascular congestion with bibasilar interstitial edema. Suspect a degree of congestive heart failure. No consolidation. Aortic Atherosclerosis (ICD10-I70.0). Electronically Signed   By: Lowella Grip III M.D.   On: 12/09/2018 08:44    Sherene Sires, DO 12/09/2018, 1:10 PM PGY-2, [place] Intern pager: [phone], text pages welcome

## 2018-12-09 NOTE — ED Notes (Signed)
Dr. Venora Maples made aware of hemoglobin.

## 2018-12-09 NOTE — ED Notes (Signed)
Cardiology NP on phone with pt.

## 2018-12-10 ENCOUNTER — Inpatient Hospital Stay (HOSPITAL_COMMUNITY): Payer: Medicare Other

## 2018-12-10 DIAGNOSIS — J810 Acute pulmonary edema: Secondary | ICD-10-CM | POA: Diagnosis present

## 2018-12-10 DIAGNOSIS — I350 Nonrheumatic aortic (valve) stenosis: Secondary | ICD-10-CM

## 2018-12-10 DIAGNOSIS — Z7189 Other specified counseling: Secondary | ICD-10-CM

## 2018-12-10 DIAGNOSIS — D631 Anemia in chronic kidney disease: Secondary | ICD-10-CM

## 2018-12-10 DIAGNOSIS — R0602 Shortness of breath: Secondary | ICD-10-CM

## 2018-12-10 DIAGNOSIS — Z515 Encounter for palliative care: Secondary | ICD-10-CM

## 2018-12-10 DIAGNOSIS — I509 Heart failure, unspecified: Secondary | ICD-10-CM

## 2018-12-10 DIAGNOSIS — G4733 Obstructive sleep apnea (adult) (pediatric): Secondary | ICD-10-CM

## 2018-12-10 DIAGNOSIS — E8779 Other fluid overload: Secondary | ICD-10-CM

## 2018-12-10 DIAGNOSIS — R0902 Hypoxemia: Secondary | ICD-10-CM

## 2018-12-10 DIAGNOSIS — Z66 Do not resuscitate: Secondary | ICD-10-CM

## 2018-12-10 LAB — TYPE AND SCREEN
ABO/RH(D): AB POS
Antibody Screen: NEGATIVE
Unit division: 0

## 2018-12-10 LAB — CBC
HCT: 24.8 % — ABNORMAL LOW (ref 39.0–52.0)
Hemoglobin: 8 g/dL — ABNORMAL LOW (ref 13.0–17.0)
MCH: 29.2 pg (ref 26.0–34.0)
MCHC: 32.3 g/dL (ref 30.0–36.0)
MCV: 90.5 fL (ref 80.0–100.0)
Platelets: 146 10*3/uL — ABNORMAL LOW (ref 150–400)
RBC: 2.74 MIL/uL — ABNORMAL LOW (ref 4.22–5.81)
RDW: 15.4 % (ref 11.5–15.5)
WBC: 10.8 10*3/uL — ABNORMAL HIGH (ref 4.0–10.5)
nRBC: 0 % (ref 0.0–0.2)

## 2018-12-10 LAB — COMPREHENSIVE METABOLIC PANEL
ALT: 56 U/L — ABNORMAL HIGH (ref 0–44)
AST: 43 U/L — ABNORMAL HIGH (ref 15–41)
Albumin: 3.7 g/dL (ref 3.5–5.0)
Alkaline Phosphatase: 51 U/L (ref 38–126)
Anion gap: 16 — ABNORMAL HIGH (ref 5–15)
BUN: 147 mg/dL — ABNORMAL HIGH (ref 8–23)
CO2: 22 mmol/L (ref 22–32)
Calcium: 9.4 mg/dL (ref 8.9–10.3)
Chloride: 98 mmol/L (ref 98–111)
Creatinine, Ser: 5.43 mg/dL — ABNORMAL HIGH (ref 0.61–1.24)
GFR calc Af Amer: 10 mL/min — ABNORMAL LOW (ref >60)
GFR calc non Af Amer: 9 mL/min — ABNORMAL LOW (ref >60)
Glucose, Bld: 186 mg/dL — ABNORMAL HIGH (ref 70–99)
Potassium: 4.9 mmol/L (ref 3.5–5.1)
Sodium: 136 mmol/L (ref 135–145)
Total Bilirubin: 1.1 mg/dL (ref 0.3–1.2)
Total Protein: 7.3 g/dL (ref 6.5–8.1)

## 2018-12-10 LAB — TROPONIN I: Troponin I: 1.08 ng/mL (ref <0.03)

## 2018-12-10 LAB — MRSA PCR SCREENING: MRSA by PCR: NEGATIVE

## 2018-12-10 LAB — GLUCOSE, CAPILLARY
Glucose-Capillary: 194 mg/dL — ABNORMAL HIGH (ref 70–99)
Glucose-Capillary: 205 mg/dL — ABNORMAL HIGH (ref 70–99)
Glucose-Capillary: 177 mg/dL — ABNORMAL HIGH (ref 70–99)
Glucose-Capillary: 165 mg/dL — ABNORMAL HIGH (ref 70–99)

## 2018-12-10 LAB — BPAM RBC
Blood Product Expiration Date: 202004272359
ISSUE DATE / TIME: 202004221228
Unit Type and Rh: 600

## 2018-12-10 LAB — BLOOD GAS, ARTERIAL
Acid-base deficit: 0.5 mmol/L (ref 0.0–2.0)
Bicarbonate: 23.6 mmol/L (ref 20.0–28.0)
Drawn by: 511331
O2 Content: 7 L/min
O2 Saturation: 84.3 %
Patient temperature: 98.2
pCO2 arterial: 38.2 mmHg (ref 32.0–48.0)
pH, Arterial: 7.407 (ref 7.350–7.450)
pO2, Arterial: 50.1 mmHg — ABNORMAL LOW (ref 83.0–108.0)

## 2018-12-10 LAB — HEMOGLOBIN A1C
Hgb A1c MFr Bld: 6.4 % — ABNORMAL HIGH (ref 4.8–5.6)
Mean Plasma Glucose: 136.98 mg/dL

## 2018-12-10 MED ORDER — ISOSORBIDE DINITRATE 10 MG PO TABS
5.0000 mg | ORAL_TABLET | Freq: Three times a day (TID) | ORAL | Status: DC
  Administered 2018-12-10 (×2): 5 mg via ORAL
  Filled 2018-12-10 (×2): qty 1

## 2018-12-10 MED ORDER — MORPHINE SULFATE (CONCENTRATE) 10 MG/0.5ML PO SOLN
5.0000 mg | ORAL | Status: DC | PRN
  Administered 2018-12-10 – 2018-12-11 (×4): 10 mg via ORAL
  Administered 2018-12-11: 09:00:00 5 mg via ORAL
  Administered 2018-12-11: 10 mg via ORAL
  Administered 2018-12-11: 09:00:00 5 mg via ORAL
  Administered 2018-12-11: 02:00:00 10 mg via ORAL
  Filled 2018-12-10 (×7): qty 0.5

## 2018-12-10 MED ORDER — ISOSORBIDE DINITRATE ER 40 MG PO TBCR: 40.0000 mg | EXTENDED_RELEASE_TABLET | Freq: Two times a day (BID) | ORAL | Status: DC

## 2018-12-10 MED ORDER — FUROSEMIDE 10 MG/ML IJ SOLN
160.0000 mg | Freq: Two times a day (BID) | INTRAVENOUS | Status: DC
  Administered 2018-12-10 – 2018-12-11 (×2): 160 mg via INTRAVENOUS
  Filled 2018-12-10: qty 16
  Filled 2018-12-10: qty 10
  Filled 2018-12-10 (×2): qty 16

## 2018-12-10 MED ORDER — METOLAZONE 5 MG PO TABS
5.0000 mg | ORAL_TABLET | Freq: Once | ORAL | Status: AC
  Administered 2018-12-10: 5 mg via ORAL
  Filled 2018-12-10: qty 1

## 2018-12-10 MED ORDER — FUROSEMIDE 10 MG/ML IJ SOLN
160.0000 mg | Freq: Once | INTRAVENOUS | Status: AC
  Administered 2018-12-10: 09:00:00 160 mg via INTRAVENOUS
  Filled 2018-12-10: qty 16

## 2018-12-10 NOTE — Progress Notes (Signed)
Family Medicine Teaching Service Daily Progress Note Intern Pager: [phone]  Patient name: [name] [name] record number: [id number] Date of birth: 1929-08-15 Age: 85 y.o. Gender: male  Primary Care Provider: System, Pcp Not In Consultants: cardiology, heme/onc, nephro Code Status: full  Pt Overview and Major Events to Date:  4/22 admitted for CHF exacerbation  Assessment and Plan: [name] [name] is a 85 y.o. male presenting with CHF exacerbation and chronic anemia. PMH is significant for CHF, chronic anemia CKD stage 5, OSA, HTN, Asthma, BPH, history of DM 2, gout, Glaucoma  CHF exacerbation- patient is down about 5 pounds today at 199.9lbs. net down about 500cc since admission Patient respiratory status worsened overnight requiring oxygen supplementation up to 15L HFNC this am. He had desats into the 60s this morning after being reduced to 10L which was improved to 100% with sitting up/tripodding and increasing back to 15L. Patient's work of breathing is very difficult. Chest xray this am showed worsening bilateral mid and lower lobe airspace opacities. Patient discontinued from lasix drip and given IV lasix with good urine output.  Urine output since admission  >1 liter (2 unmeasured). currently on 160mg  IV BID lasix plus metolazone.  -f/u  Cardiology recs - lasix 160mg  IV BID - metolazone 5mg  - initiating isosorbide dinitrate 5mg  TID to help reduce preload - ensure good fit of [place] piece in nares - continuous pulse ox -Oxygen as needed to keep sats over 92 -strict I/os -daily weights  Elevated troponins, possible NSTEMI vs chronic ischemia. 0.58>0.79>1.08 with ECG abnormalities showing st depression, qt prolongation, sinus rhythm, left ventricular hypertrophy Patient is not a cath candidate. He is also not an anticoagulation candidate.  - f/u cards consult.  Chronic Anemia- hgb 8.0 today s/p 1unit pRBC. Aranesp received yesterday. FOBT+ -Daily CBC -Aranesp per  nephro  CKD5- patient still makes urine. Him and family have declined dialysis. nephrology consulted and recommended post-void residual bladder scans and place foley if >37mL, No emergent indication for dialysis, defer bicarb for now. - avoid nephrotoxic agents  HTN- well controlled this admission. -q4 vitals - continue amlodipine 10mg  daily - continue hydralazine 50mg  TID, and increase if needed  Chronic constipation: had BM in ED -Continue home Colace, MiraLAX  History of asthma: No wheezing on exam- diffuse crackles and severe work of breathing including subcostal retractions -Every 2 as needed at home inhaler albuterol -Continue home montelukast -Patient is on oxygen supplementation as needed to keep saturations above 92  History of DM 2: A1c this admission 6.4. CBGs  ~200 today -CBGs q4hr - sSSI  Glaucoma- -continue home latanoprost eyedrops  BPH- -Continue home Flomax - strict I/Os  Gout- no current flare -Allopurinol 100mg  daily  FEN/GI: Heart healthy diet with fluid restriction to 1500 Prophylaxis: SCDs given potential bleed  Disposition: Cardiac telemetry pending CHF resolution  Subjective:  Patient sitting forward in tripod position in bed on arrival with [place] half out of nose but on 15L and O2 sat 100%. He has very heavy work of breathing and able to answer questions with a few words sentences. He was intermittently sleepy and not able to answer questions appropriately. He denies chest pain. He states that his breathing is better today than yesterday. Patient's daughter at bedside with palliative medicine. She has decided that she would like to take her father home to care for him at home with hospice/comfort care. They have decided to decline dialysis, bipap.  Objective: Temp:  [97.5 F (36.4 C)-97.9 F (36.6 C)]  97.8 F (36.6 C) (04/23 0459) Pulse Rate:  [71-88] 83 (04/23 0500) Resp:  [9-28] 28 (04/23 0500) BP: (108-139)/(65-83) 137/77 (04/23  0500) SpO2:  [89 %-100 %] 94 % (04/23 0500) Weight:  [90.7 kg-93 kg] 90.7 kg (04/23 0459) Physical Exam: General: patient appears to be really struggling to breath. Intermittently confused Cardiovascular: regular rate and rhythm with positive murmur difficult to hear with oxygen Respiratory: crackles diffusely with decreased breath sounds at bases. Positive subcostal retractions. Speaking in few word sentences. Clear nasal drainage Abdomen: soft, obese, non-tender Extremities: trace LE edema bilaterally  Laboratory: Recent Labs  Lab 12/09/18 0827 12/09/18 1828 12/10/18 0204  WBC 6.1  --  10.8*  HGB 6.6* 7.7* 8.0*  HCT 21.4* 24.0* 24.8*  PLT 125*  --  146*   Recent Labs  Lab 12/09/18 0827 12/10/18 0204  NA 137 136  K 4.7 4.9  CL 95* 98  CO2 24 22  BUN 143* 147*  CREATININE 5.61* 5.43*  CALCIUM 9.5 9.4  PROT 6.9 7.3  BILITOT 0.9 1.1  ALKPHOS 46 51  ALT 56* 56*  AST 40 43*  GLUCOSE 170* 186*   ABG    Component Value Date/Time   PHART 7.407 12/10/2018 0310   PCO2ART 38.2 12/10/2018 0310   PO2ART 50.1 (L) 12/10/2018 0310   HCO3 23.6 12/10/2018 0310   TCO2 23 12/04/2016 0954   ACIDBASEDEF 0.5 12/10/2018 0310   O2SAT 84.3 12/10/2018 0310   troponins- 0.58>0.79>1.08   Imaging/Diagnostic Tests: Dg Chest Port 1 View  Result Date: 12/10/2018 CLINICAL DATA:  Increased shortness of breath EXAM: PORTABLE CHEST 1 VIEW COMPARISON:  12/09/2018 FINDINGS: Cardiomegaly. Worsening bilateral mid and lower lung airspace opacities. Favor edema although infection is not excluded. Possible small layering effusions. No acute bony abnormality. IMPRESSION: Cardiomegaly. Worsening bilateral mid and lower lobe airspace opacities, favor edema/CHF although infection is not excluded. Possible small effusions. Electronically Signed   By: Rolm Baptise M.D.   On: 12/10/2018 01:17   Dg Chest Portable 1 View  Result Date: 12/09/2018 CLINICAL DATA:  Chest pain EXAM: PORTABLE CHEST 1 VIEW  COMPARISON:  July 22, 2018 FINDINGS: There is cardiomegaly with mild pulmonary venous hypertension. There is interstitial edema in the lung bases. There is no appreciable airspace consolidation. There is aortic atherosclerosis. No adenopathy. No bone lesions. IMPRESSION: Pulmonary vascular congestion with bibasilar interstitial edema. Suspect a degree of congestive heart failure. No consolidation. Aortic Atherosclerosis (ICD10-I70.0). Electronically Signed   By: Lowella Grip III M.D.   On: 12/09/2018 08:44     Richarda Osmond, DO 12/10/2018, 7:09 AM PGY-1, [place] Intern pager: [phone], text pages welcome

## 2018-12-10 NOTE — Progress Notes (Signed)
FPTS Interim Progress Note  S: Called to check on patient and nurse informed us that rapid response had been called and patient had been placed on high flow nasal cannula.  Patient with increased work of breathing and only able to speak 1-2 words given shortness of breath  O: BP 125/65   Pulse 80   Temp 97.7 F (36.5 C) (Oral)   Resp (!) 22   Ht 6' (1.829 m)   Wt 92.9 kg   SpO2 90%   BMI 27.78 kg/m   General: moderate distress, diaphoretic, only able to speak 1-2 words Respiratory: Coarse breath sounds throughout with expiratory wheezing, use of accessory muscles on 7 L HF  Derm: no rashes appreciated Neuro: CN II-XII grossly intact  A/P: CXR shows worsening bilateral mid and lower lobe airspace opacities as favoring edema/CHF although infection was not excluded. -Will consult CCM regarding patient's worsening respiratory status -Obtain ABG -Obtain EKG given diaphoresis -May require BiPAP> will need to be transferred to different floor if so  [name] [name], [place], DO 12/10/2018, 2:28 AM PGY-2, [place] Medicine Service pager [phone]

## 2018-12-10 NOTE — Consult Note (Signed)
Consultation Note Date: 12/10/2018   Patient Name: [name] [name]  DOB: 1929-02-24  MRN: [id number]  Age / Sex: 85 y.o., male   PCP: System, Pcp Not In Referring Physician: McDiarmid, Blane Ohara, MD   REASON FOR CONSULTATION:Establishing goals of care  Palliative Care consult requested for this 85 y.o. male with multiple medical problems including chronic diastolic congestive heart failure, aortic stenosis, anemia, stage V kidney disease, OSA, hypertension, asthma, BPH, type 2 diabetes, gout, and glaucoma. He was admitted due to increased body weight (6-8lbs) and edema despite a 2L fluid restriction. He has refused hemodialysis for worsening renal failure.   Clinical Assessment and Goals of Care: I have reviewed medical records including lab results, imaging, Epic notes, and MAR, received report from the bedside RN, and assessed the patient. I met at the bedside with patient and his daughter, [name] [name] to discuss diagnosis prognosis, GOC, EOL wishes, disposition and options. Patient is asleep, but arousable. He was able to appropriately identify his name, date of birth, location, and recognize his daughter.   I introduced Palliative Medicine as specialized medical care for people living with serious illness. It focuses on providing relief from the symptoms and stress of a serious illness. The goal is to improve quality of life for both the patient and the family.   Daughter verbalized her familiarity with Palliative and hospice after caring for her mother in the home.   We discussed a brief life review of the patient, along with his functional and nutritional status. Daughter reports patient is originally from [place]. She is the only child. Patient has been in the [place] for over 45 years. 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 She reports he enjoys spending time with family, sports, and being outside.   Prior to admission patient  reports he was living with his daughter. He was able to perform ADLs independently, however required frequent breaks due to fatigue and shortness of breath. Daughter reports he had a great appetite, however it has been diminished for the past week.   We discussed His current illness and what it means in the larger context of His on-going co-morbidities. With specific discussions regarding his chronic renal failure, pulmonary edema, CHF, and overall functional state. Natural disease trajectory and expectations at EOL were discussed.   Daughter tearful in discussion. She expressed she knows that he does not feel well and is tired. She reports her main focus for him is to get back home as soon as possible and be kept comfortable until he passes away.   I attempted to elicit values and goals of care important to the patient.    Patient states he is aware of his worsening kidney failure and adamant that he DOES NOT WANT hemodialysis. I explained prognosis without HD in relation to his co-morbidities. Patient expressed he understood and wish to return home and pass away whenever that occurs verus the hospital. Support given.   The difference between aggressive medical intervention and comfort care was considered in light of the patient's goals of care. I educated patient/family on what comfort care measures would look like. I discussed with them with comfort care, patient would no longer receive aggressive medical interventions such as continuous vital signs, lab work, radiology testing, or medications not focused on comfort. All care would focus on how the patient is looking and feeling. This would include management of any symptoms that may cause  discomfort, pain, shortness of breath, cough, nausea, agitation, anxiety, and/or secretions etc. Symptoms will be managed with medications and other non-pharmacological interventions such as spiritual support if requested, repositioning, music therapy, or therapeutic  listening. Daughter and patient verbalized understanding and appreciation.   Patient does not have an advanced directive. I discussed specifically with daughter and patient his full code status with consideration to his co-morbidities and expressed goals for comfort. Patient expressed his wishes for DNR/DNI. Daughter verbalized agreement and stated he has expressed his wishes to her previously. I discussed his current respiratory distress and the use of BiPAP. Patient and daughter also declines.   Hospice and Palliative Care services outpatient were explained and offered. Patient and family verbalized their understanding and awareness of both palliative and hospice's goals and philosophy of care. Again daughter expresses she if familiar with their services after caring for her mother in the home. She expressed she will need home oxygen and a hospital bed. She is planning to go home and begin cleaning up his room. She expressed she is prepared to care for him in the home as she did her mother with hospice and family support.   Questions and concerns were addressed.  Hard Choices booklet left for review. The family was encouraged to call with questions or concerns.  PMT will continue to support holistically.   SOCIAL HISTORY:     reports that he has quit smoking. He has never used smokeless tobacco. He reports that he does not drink alcohol or use drugs.  CODE STATUS: DNR  ADVANCE DIRECTIVES: Primary Decision Maker: [name] [name]   SYMPTOM MANAGEMENT:  Palliative Prophylaxis:   Aspiration, Delirium Protocol, Frequent Pain Assessment and Oral Care  PSYCHO-SOCIAL/SPIRITUAL:  Support System: Daugher  Desire for further Chaplaincy support:NO, daughter declined   Additional Recommendations (Limitations, Scope, Preferences):  Avoid Hospitalization, No Diagnostics and No Hemodialysis, continue to treat with goal of discharging home with hospice for EOL/comfort care. No escalation of care.     PAST MEDICAL HISTORY: Past Medical History:  Diagnosis Date   Anemia 12/09/2018   blood transfusion given   CHF (congestive heart failure) (HCC)    CKD (chronic kidney disease), stage IV ([place]) 10/25/2017   Diabetes mellitus without complication (HCC)    Dyspnea    Hyperlipidemia    Hypertension    Stroke [facility] [place])    2009    PAST SURGICAL HISTORY:  Past Surgical History:  Procedure Laterality Date   NECK SURGERY     lump removed   REPLACEMENT TOTAL KNEE     Left and Right    ALLERGIES:  is allergic to flu virus vaccine; fosinopril sodium; hydrocodone-acetaminophen; meloxicam; and valsartan.   MEDICATIONS:  Current Facility-Administered Medications  Medication Dose Route Frequency Provider Last Rate Last Dose   albuterol (PROVENTIL) (2.5 MG/3ML) 0.083% nebulizer solution 2.5 mg  2.5 mg Nebulization Q2H PRN Wilber Oliphant, MD   2.5 mg at 12/09/18 2343   allopurinol (ZYLOPRIM) tablet 50 mg  50 mg Oral Daily Sherene Sires, DO       amLODipine (NORVASC) tablet 10 mg  10 mg Oral QHS Bland, Scott, DO   10 mg at 12/09/18 2117   docusate sodium (COLACE) capsule 100 mg  100 mg Oral Daily Bland, Scott, DO   100 mg at 12/09/18 1639   fluticasone (FLONASE) 50 MCG/ACT nasal spray 1 spray  1 spray Each Nare BID Sherene Sires, DO   1 spray at 12/10/18 0946   hydrALAZINE (APRESOLINE) tablet 50 mg  50 mg Oral TID Sherene Sires, DO   50 mg at 12/10/18 0946   insulin aspart (novoLOG) injection 0-9 Units  0-9 Units Subcutaneous TID WC Sherene Sires, DO   2 Units at 12/10/18 7681   isosorbide dinitrate (ISORDIL) tablet 5 mg  5 mg Oral TID McDiarmid, Blane Ohara, MD       latanoprost (XALATAN) 0.005 % ophthalmic solution 1 drop  1 drop Left Eye QHS Bland, Scott, DO   1 drop at 12/09/18 2110   montelukast (SINGULAIR) tablet 10 mg  10 mg Oral QHS Bland, Scott, DO   10 mg at 12/09/18 2117   polyethylene glycol (MIRALAX / GLYCOLAX) packet 17 g  17 g Oral PRN Sherene Sires, DO       polyvinyl  alcohol (LIQUIFILM TEARS) 1.4 % ophthalmic solution 1 drop  1 drop Right Eye QHS Zenia Resides, MD   1 drop at 12/09/18 2110   sodium chloride flush (NS) 0.9 % injection 3 mL  3 mL Intravenous Q12H Bland, Scott, DO   3 mL at 12/10/18 0946   tamsulosin (FLOMAX) capsule 0.4 mg  0.4 mg Oral Daily Bland, Scott, DO   0.4 mg at 12/09/18 1615    VITAL SIGNS: BP (!) 151/73    Pulse 77    Temp (!) 97.5 F (36.4 C) (Oral)    Resp (!) 26    Ht 6' (1.829 m)    Wt 90.7 kg    SpO2 100%    BMI 27.12 kg/m  Filed Weights   12/09/18 0819 12/10/18 0154 12/10/18 0459  Weight: 93 kg 92.9 kg 90.7 kg    Estimated body mass index is 27.12 kg/m as calculated from the following:   Height as of this encounter: 6' (1.829 m).   Weight as of this encounter: 90.7 kg.  LABS: CBC:    Component Value Date/Time   WBC 10.8 (H) 12/10/2018 0204   HGB 8.0 (L) 12/10/2018 0204   HCT 24.8 (L) 12/10/2018 0204   PLT 146 (L) 12/10/2018 0204   Comprehensive Metabolic Panel:    Component Value Date/Time   NA 136 12/10/2018 0204   K 4.9 12/10/2018 0204   CO2 22 12/10/2018 0204   BUN 147 (H) 12/10/2018 0204   CREATININE 5.43 (H) 12/10/2018 0204   ALBUMIN 3.7 12/10/2018 0204     Review of Systems  Respiratory: Positive for shortness of breath.   Neurological: Positive for weakness.  All other systems reviewed and are negative.  Physical Exam Vitals signs and nursing note reviewed.  Constitutional:      Appearance: Normal appearance. He is ill-appearing.     Comments: Chronically ill   Cardiovascular:     Rate and Rhythm: Normal rate and regular rhythm.     Pulses: Normal pulses.     Heart sounds: Normal heart sounds.     Comments: Trace bilateral lower edema  Pulmonary:     Effort: Accessory muscle usage present.     Breath sounds: Decreased breath sounds and rhonchi present.  Abdominal:     General: Bowel sounds are normal.     Palpations: Abdomen is soft.  Skin:    General: Skin is warm and dry.    Neurological:     Mental Status: He is alert, oriented to person, place, and time and easily aroused.     Motor: No weakness.  Psychiatric:        Behavior: Behavior is cooperative.    Prognosis: < 6 weeks in the  setting or worsening renal failure (patient/daughter has declined HD), worsening respiratory, pulmonary edema, acute on chronic diastolic CHF, demand ischemia, chronic kidney disease stage V, asthma, diabetes, and glaucoma.  Discharge Planning:  Home with Hospice per patient and daughter   Recommendations:  DNR/DNI-as requested by patient and daughter  Continue to treat with goal of getting home as soon as possible with hospice for comfort/EOL care.   No escalation of care, No BiPAP, NO HD  Hospice at home. Daughter will need hospital bed and home oxygen. Sam, CM/SW is aware of needs and request for [facility].   Roxanol PRN for pain/shortness of breath  Foley cath to d/c home with patient for comfort/EOL   PMT will continue to support and follow as needed    Palliative Performance Scale: PPS 20%             Patient/Daughter, [name] expressed understanding and was in agreement with this plan.   Thank you for allowing the Palliative Medicine Team to assist in the care of this patient.  Time In: 1200 Time Out: 1330 Time Total: 90 min  Visit consisted of counseling and education dealing with the complex and emotionally intense issues of symptom management and palliative care in the setting of serious and potentially life-threatening illness.Greater than 50%  of this time was spent counseling and coordinating care related to the above assessment and plan.  Signed by:  Alda Lea, AGPCNP-BC Palliative Medicine Team  Phone: [phone] Fax: [phone] Pager: [phone] Amion: Bjorn Pippin

## 2018-12-10 NOTE — Progress Notes (Signed)
Placed pt on 15L HFNC per most recent ABG. RN aware.

## 2018-12-10 NOTE — Progress Notes (Signed)
Progress Note  Patient Name: [name] [name] Date of Encounter: 12/10/2018  Primary Cardiologist: Lucita Ferrara, MD [facility]  Subjective   More SOB during the night. Left precordial chest pain is better.   Inpatient Medications    Scheduled Meds: . allopurinol  50 mg Oral Daily  . amLODipine  10 mg Oral QHS  . docusate sodium  100 mg Oral Daily  . fluticasone  1 spray Each Nare BID  . hydrALAZINE  50 mg Oral TID  . insulin aspart  0-9 Units Subcutaneous TID WC  . latanoprost  1 drop Left Eye QHS  . metolazone  5 mg Oral Once  . montelukast  10 mg Oral QHS  . polyvinyl alcohol  1 drop Right Eye QHS  . sodium chloride flush  3 mL Intravenous Q12H  . tamsulosin  0.4 mg Oral Daily   Continuous Infusions: . furosemide     PRN Meds: albuterol, polyethylene glycol   Vital Signs    Vitals:   12/10/18 0324 12/10/18 0459 12/10/18 0500 12/10/18 0729  BP:  137/77 137/77 140/86  Pulse:  85 83   Resp:  11 (!) 28   Temp:  97.8 F (36.6 C)  (!) 97.5 F (36.4 C)  TempSrc:  Oral  Oral  SpO2: 93% 100% 94%   Weight:  90.7 kg    Height:  6' (1.829 m)      Intake/Output Summary (Last 24 hours) at 12/10/2018 0757 Last data filed at 12/10/2018 0400 Gross per 24 hour  Intake 923.28 ml  Output 921 ml  Net 2.28 ml   Last 3 Weights 12/10/2018 12/10/2018 12/09/2018  Weight (lbs) 199 lb 15.3 oz 204 lb 12.9 oz 205 lb  Weight (kg) 90.7 kg 92.9 kg 92.987 kg      Telemetry    NSR with rare PVC. 6 beat run NSVT - Personally Reviewed  ECG    NSR with LVH and repolarization abnormality - Personally Reviewed  Physical Exam   GEN: elderly BM in respiratory distress Neck:  JVD to jaw Cardiac: RRR, gr 2/6 systolic murmur RUSB  Respiratory: Coarse rales bilaterally, diffuse GI: Soft, nontender, non-distended  MS: 2-3+ edema; No deformity. Neuro:  Nonfocal  Psych: Normal affect   Labs    Chemistry Recent Labs  Lab 12/09/18 0827 12/10/18 0204   NA 137 136  K 4.7 4.9  CL 95* 98  CO2 24 22  GLUCOSE 170* 186*  BUN 143* 147*  CREATININE 5.61* 5.43*  CALCIUM 9.5 9.4  PROT 6.9 7.3  ALBUMIN 3.6 3.7  AST 40 43*  ALT 56* 56*  ALKPHOS 46 51  BILITOT 0.9 1.1  GFRNONAA 8* 9*  GFRAA 10* 10*  ANIONGAP 18* 16*     Hematology Recent Labs  Lab 12/09/18 0827 12/09/18 1828 12/10/18 0204  WBC 6.1  --  10.8*  RBC 2.27*  --  2.74*  HGB 6.6* 7.7* 8.0*  HCT 21.4* 24.0* 24.8*  MCV 94.3  --  90.5  MCH 29.1  --  29.2  MCHC 30.8  --  32.3  RDW 15.2  --  15.4  PLT 125*  --  146*    Cardiac Enzymes Recent Labs  Lab 12/09/18 0827 12/09/18 2033 12/10/18 0204  TROPONINI 0.58* 0.79* 1.08*   No results for input(s): TROPIPOC in the last 168 hours.   BNPNo results for input(s): BNP, PROBNP in the last 168 hours.   DDimer No results for input(s): DDIMER in  the last 168 hours.   Radiology    Dg Chest Port 1 View  Result Date: 12/10/2018 CLINICAL DATA:  Increased shortness of breath EXAM: PORTABLE CHEST 1 VIEW COMPARISON:  12/09/2018 FINDINGS: Cardiomegaly. Worsening bilateral mid and lower lung airspace opacities. Favor edema although infection is not excluded. Possible small layering effusions. No acute bony abnormality. IMPRESSION: Cardiomegaly. Worsening bilateral mid and lower lobe airspace opacities, favor edema/CHF although infection is not excluded. Possible small effusions. Electronically Signed   By: Rolm Baptise M.D.   On: 12/10/2018 01:17   Dg Chest Portable 1 View  Result Date: 12/09/2018 CLINICAL DATA:  Chest pain EXAM: PORTABLE CHEST 1 VIEW COMPARISON:  July 22, 2018 FINDINGS: There is cardiomegaly with mild pulmonary venous hypertension. There is interstitial edema in the lung bases. There is no appreciable airspace consolidation. There is aortic atherosclerosis. No adenopathy. No bone lesions. IMPRESSION: Pulmonary vascular congestion with bibasilar interstitial edema. Suspect a degree of congestive heart failure. No  consolidation. Aortic Atherosclerosis (ICD10-I70.0). Electronically Signed   By: Lowella Grip III M.D.   On: 12/09/2018 08:44    Cardiac Studies   Echo 07/24/18: Study Conclusions  - Left ventricle: The cavity size was moderately dilated. Wall   thickness was increased in a pattern of moderate LVH. Systolic   function was normal. The estimated ejection fraction was in the   range of 50% to 55%. The study is not technically sufficient to   allow evaluation of LV diastolic function. - Aortic valve: There was moderate stenosis. There was mild   regurgitation. Valve area (VTI): 1.08 cm^2. Valve area (Vmax):   1.18 cm^2. Valve area (Vmean): 1.16 cm^2. - Mitral valve: Severely calcified annulus. Moderately thickened,   mildly calcified leaflets . There was mild regurgitation. Valve   area by continuity equation (using LVOT flow): 2.1 cm^2. - Left atrium: The atrium was severely dilated. - Atrial septum: No defect or patent foramen ovale was identified.  Patient Profile     85 y.o. male with a hx of chronic diastolic HF with normal EF 65% in 2016, CKD5 with no intention of dialysis per chart, resistant HTN, DM-2, HLD and anemia with blood transfusions who is being seen today for the evaluation of chest pain and CHF with wt gain, now presenting to ER  Assessment & Plan    1. Acute on chronic diastolic CHF. Severely volume overloaded with pulmonary and LE edema. CXR has worsened overnight with increased effort of breathing and increasing oxygen requirement. With transfusion I/O neutral despite IV lasix drip. Giving lasix 160 mg IV and metolazone 5 mg po this am. Ability to diurese severely limited by CKD stage V. 2. Acute on chronic anemia. History of mild myelodysplasia as well as heme + stool. Transfused with Hgb up to 8.  3. Moderate AS 4. CKD stage V. Rising BUN related to GI bleed and CHF 5. Elevated troponin- low grade and flat trend due to demand ischemia with CHF, CKD and severe  anemia. Not a candidate for invasive evaluation. 6. Chest pain. Mostly secondary to demand ischemia and CHF. I think risk of PE is low and he is at any rate not a candidate for anticoagulation  The patient is critically ill with multiple organ systems failure and requires high complexity decision making for assessment and support, frequent evaluation and titration of therapies, application of advanced monitoring technologies and extensive interpretation of multiple databases. His prognosis is very poor without dialysis and if he does not want dialysis then  palliative approach is best option.       For questions or updates, please contact [place] Please consult [email or web address] for contact info under        Signed, Mayra Jolliffe [place], MD  12/10/2018, 7:57 AM

## 2018-12-10 NOTE — Progress Notes (Signed)
[facility] Progress Note  Name: [name] [name] MRN: [id number] DOB: 24-Mar-1929  Chief Complaint:  Shortness of breath  Subjective:  I spoke with the patient again last night after consult and at that time he had had a poor response to bolus lasix and was short of breath so I initiated lasix gtt.  Note that overnight he was transitioned back to bolus dosing given refractory shortness of breath.  This morning he is able to definitively state to me that he does not want dialysis and is no longer "thinking about it".  He states that he is tired.  Palliative care has been consulted.  He is now a DNR.  Spoke with palliative and she states that they are discussing a transition to comfort care and she is trying to get approval for the patient's daughter to come in.  Review of systems:  Reports shortness of breath  No nausea Decreased urine output   ----------- Background on consult:  [name] [name] is a 85 y.o. male with a history of CKD stage V not on dialysis, HTN, CHF, anemia, and secondary hyperparathyroidism who presented with shortness of breath and weakness.  The patient states that he follows with nephrology in [place]; per Epic previously seen by Dr. Randie Heinz.  See from his 05/2018 clinic note that he had both refused dialysis but also said that he "may think about it."  He tells me that he was told he needed dialysis 2 years ago and he said "no way" and he states that he would not want to go on dialysis.  He watched a first cousin suffer quite a bit on dialysis and this gentleman just recently passed away.  We discussed that the alternative to not doing dialysis should his renal function worsen or fail to improve would be to transition to comfort measures; he then states that "I will think about it."  He was previously felt to be a suboptimal candidate for dialysis per notes from a 07/2018 [facility] hospitalization.  Per cardiology note on 10/21/2018, he had  consistently stated to them that he would rather die than go on dialysis.  He denies nausea or vomiting.  No altered mental status.    Intake/Output Summary (Last 24 hours) at 12/10/2018 1028 Last data filed at 12/10/2018 0945 Gross per 24 hour  Intake 982.99 ml  Output 1471 ml  Net -488.01 ml    Vitals:  Vitals:   12/10/18 0500 12/10/18 0729 12/10/18 0800 12/10/18 1000  BP: 137/77 140/86 134/75 (!) 151/73  Pulse: 83  81 77  Resp: (!) 28  (!) 26   Temp:  (!) 97.5 F (36.4 C)    TempSrc:  Oral    SpO2: 94%  99% 100%  Weight:      Height:         Physical Exam:  General elderly male in bed short of breath HEENT normocephalic atraumatic extraocular movements intact  Neck supple trachea midline Lungs coarse breath sounds and increased work of breathing  Heart S1S2; no rub Abdomen soft nontender nondistended Extremities 1+ edema  Neuro - oriented to person and location and recognizes me  Medications reviewed   Labs:  BMP Latest Ref Rng & Units 12/10/2018 12/09/2018 07/26/2018  Glucose 70 - 99 mg/dL 186(H) 170(H) 128(H)  BUN 8 - 23 mg/dL 147(H) 143(H) 78(H)  Creatinine 0.61 - 1.24 mg/dL 5.43(H) 5.61(H) 4.06(H)  Sodium 135 - 145 mmol/L 136 137 134(L)  Potassium 3.5 - 5.1 mmol/L 4.9 4.7  3.8  Chloride 98 - 111 mmol/L 98 95(L) 97(L)  CO2 22 - 32 mmol/L 22 24 21(L)  Calcium 8.9 - 10.3 mg/dL 9.4 9.5 9.2     Assessment/Plan:   # CKD stage V - Progressive CKD and has elected not to pursue dialysis and is a poor candidate - From a nephrology standpoint, appropriate to transition to comfort care.  Appreciate palliative care and primary team   # CHF acute on chronic systolic - Continue lasix and metolazone   # Anemia of chronic disease  - Secondary in part to CKD  - s/p ESA - aranesp on 4/22  # HTN - acceptable   # Secondary hyperparathyroidism  - secondary to CKD; updated PTH   Claudia Desanctis, MD 12/10/2018 10:28 AM

## 2018-12-10 NOTE — Progress Notes (Addendum)
FPTS Interim Progress Note  Spoke with cardiology fellow over the phone regarding patient's rising troponins.  Given that patient has CKD stage V and does not desire dialysis, he is not a candidate for cath as previously stated by cardiology during the day.  States that all she could offer him at this time is a heparin drip; however he is not a candidate for anticoagulation either. Goal hemoglobin is 8.5, and patient now at 8.0. Patient need goals of care discussion if he plans not to go forward with HD.  Reviewed ABG, patient now on 15 L HFNC and transferred to progressive.  [place] Curtiss Mahmood, DO 12/10/18 3:47 AM  PGY-2, [facility]  Service pager [phone]

## 2018-12-10 NOTE — Significant Event (Addendum)
Rapid Response Event Note Nursing staff called for SOB and desats to 83% on 5L [place]  Overview: Time Called: 1219 Arrival Time: 1225 Event Type: Respiratory  Initial Focused Assessment: General: Elderly male in bed in mild acute distress at rest, c/o SOB, can speak in several word phrases, some intercostal retractions HEENT: Normocephalic atraumatic Eyes: Ocular movements intact sclera anicteric Neck: Neck supple trachea midline Heart: S1 S2 no rub appreciated Lungs: No wheeze, Bilateral rales 2/3 lung fields, congested cough abdomen: Nontender nondistended Extremities: trace lower extremity edema Skin: somewhat Decreased skin turgor Neuro: Alert and oriented x3 and answers simple questions Psych normal normal mood and affect  HR 82 SR, 125/65 (84), RR 22 with sats 97% on 5L HFNC  Interventions: -Placed pt on HFNC ([place]) at [facility] pending (previously ordered) -Moved Pulse oximetry probe to pts ear  Plan of Care (if not transferred): -Notify primary svc of events -Monitor for increased WOB and increased SOB -Monitor urine output from Lasix infusion 100 mg previously ordered -Low threshold for BIPAP if further inc WOB or desaturations  Addendum: 0415- Pt with little response to diuresis, now on 15L HFNC for sats 93%. He wakes easily and is coherent. Continued mild distress with some intercostal retractions, however he can speak in 2-3 word phrases. Not sure if BIPAP will achieve overall goal if diuresis is not successful. Will transfer to progressive care.   Event Summary: Name of Physician Notified: Communication with MD Prior to my arrival at      at    Outcome: Stayed in room and stabalized     Madelynn Done

## 2018-12-10 NOTE — Progress Notes (Signed)
PCCM Brief Evaluation    85 yr old w/ PMH is significant for CHF, chronic anemia CKD stage 5, OSA, hypertension, Asthma, BPH, history of DM 2, and Gout Called to evaluate patient at bedside for Acute Respiratory failure requiring intubation I called RRT RN Irma Newness and RT prior to coming to the floor in preparation for possible intubation. Upon my evaluation pt was asleep easily arousable and verbal with some retractions but not in severe distress. On 6L [place]. He received his last dose of Lasix IV at 12:30 AM Pt has clear urine in his condom cath.  FP Physicians were not at the bedside RN paged them to bedside.  Discussed plan moving forward with RRT and RNs  Plan: If pt requires more O2 or ABG shows worsening acid base BiPAP will provide positive pressure ventilation In combination with continued diuresis this should be effective. He has received IV Lasix, if his urinary response has decreased 30 mins prior to lasix dose Metolazone may help. Pt was previously on hemodialysis and has since stated that he did not want to return Moving forward given his age and reluctance to certain therapies it may be appropriate to consult Palliative care, I will defer this to the primary team. If pt's condition worsens please feel free to re-consult PCCM at that time.  Signed Dr Seward Carol Pulmonary Critical Care Locums

## 2018-12-10 NOTE — TOC Initial Note (Addendum)
Transition of Care [facility]) - Initial/Assessment Note    Patient Details  Name: [name] [name] MRN: [id number] Date of Birth: 1928-08-27  [facility]) CM/SW Contact:    Maryclare Labrador, RN Phone Number: 12/10/2018, 2:00 PM  Clinical Narrative:       PTA from home with daughter.  [place] discussions were had by Palliative Care with Daughter - the decision has been made to discharge home with hospice. Daughter plans to provide all care for pt at first and will enlist family as needed.   Home with Hospice choice was given via phone - daughter chose [facility] - agency contacted and referral given - agency to follow back up with CM regarding acceptance/rejection of referral.  Pt will need hospital bed and home oxygen at discharge.  Daughter requesting PTAR transport - CM confirmed with PTAR that pt can be transported home on 15 liters HF.            Expected Discharge Plan: Home w Hospice Care Barriers to Discharge: Barriers Resolved   Patient Goals and CMS Choice     Choice offered to / list presented to : Adult Children  Expected Discharge Plan and Services Expected Discharge Plan: Home w Hospice Care   Discharge Planning Services: CM Consult Post Acute Care Choice: Hospice Living arrangements for the past 2 months: [place]: [facility] [place]) Date [facility] Agency Contacted: 12/03/18 Time HH Agency Contacted: 5329 Representative spoke with at [place]: Delsa Sale with [facility])  Prior Living Arrangements/Services Living arrangements for the past 2 months: [place] with:: Adult Children(Daughter Kateri Mc)          Need for Family Participation in Patient Care: Yes (Comment) Care giver support system in place?: Yes (comment)      Activities of Daily Living Home Assistive Devices/Equipment: Walker (specify type), Grab bars in shower, Cough assistive device  (Peds) ADL Screening (condition at time of admission) Patient's cognitive ability adequate to safely complete daily activities?: No Is the patient deaf or have difficulty hearing?: Yes Does the patient have difficulty seeing, even when wearing glasses/contacts?: No Does the patient have difficulty concentrating, remembering, or making decisions?: Yes Patient able to express need for assistance with ADLs?: Yes Does the patient have difficulty dressing or bathing?: No Independently performs ADLs?: Yes (appropriate for developmental age) Does the patient have difficulty walking or climbing stairs?: Yes Weakness of Legs: Both Weakness of Arms/Hands: None  Permission Sought/Granted Permission sought to share information with : Case Manager, Customer service manager Permission granted to share information with : Yes, Verbal Permission Granted(Permission given by care giver daughter Kateri Mc)     Permission granted to share info w AGENCY: [facility]        Emotional Assessment           Psych Involvement: No (comment)  Admission diagnosis:  Hypoxia [R09.02] Anemia, unspecified type [D64.9] Acute on chronic congestive heart failure, unspecified heart failure type [facility]) [I50.9] Patient Active Problem List   Diagnosis Date Noted  . Shortness of breath   . CHF (congestive heart failure) ([place]) 12/09/2018  . Heme positive stool   . Demand ischemia ([place])   . Hypoxia   . Acute exacerbation of CHF (congestive heart failure) ([place]) 07/22/2018  . Volume overload 07/22/2018  . ESRD (end  stage renal disease) ([place]) 07/22/2018  . Anemia 07/22/2018  . Gout 07/22/2018  . CVA (cerebral vascular accident) ([place]) 07/22/2018  . Physical deconditioning 07/22/2018  . Stage 5 chronic kidney disease not on chronic dialysis ([place]) 10/26/2017  . Acute on chronic diastolic heart failure ([place]) 10/26/2017  . Right foot pain   . Hyperkalemia   . Acute on chronic diastolic CHF (congestive heart  failure) ([place]) 10/25/2017  . DM (diabetes mellitus), type 2 with renal complications ([place]) 75/79/7282  . Dyslipidemia 10/25/2017  . Chest pain 09/21/2014  . AORTIC VALVE SCLEROSIS 02/24/2009  . PERIPHERAL EDEMA 11/29/2008  . THROMBOCYTOPENIA 11/25/2008  . PVD 11/25/2008  . RESTING TREMOR 11/11/2008  . BPH (benign prostatic hyperplasia) 07/04/2008  . Asthma 07/17/2007  . OBSTRUCTIVE SLEEP APNEA 06/22/2007  . OVERACTIVE BLADDER 06/15/2007  . History of cardiovascular disorder 03/30/2007  . Essential hypertension 03/23/2007  . DEGENERATIVE JOINT DISEASE 03/23/2007   PCP:  System, Pcp Not In Pharmacy:   [facility], [address] [place] [address] Phone: [phone] Fax: [phone]     Social Determinants of Health (SDOH) Interventions    Readmission Risk Interventions No flowsheet data found.

## 2018-12-10 NOTE — Discharge Summary (Signed)
[facility] Discharge Summary  Patient name: [place] record number: [id number] Date of birth: 11-19-1928 Age: 85 y.o. Gender: male Date of Admission: 12/09/2018  Date of Discharge: 12/11/2018 Admitting Physician: Zenia Resides, MD  Primary Care Provider: System, Pcp Not In Consultants:  Palliative   Cardiology Nephrology   Indication for Hospitalization: CHF exacerbation,pulmonary edema  Discharge Diagnoses/Problem List:  Acute pulmonary edema Acute on CHFpF exacerbation Moderate AS Mild MVR Demand ischemia Hypoxemia Acute on chronic anemia Stage V CKD refusing dialysis  Disposition:  Discharge home for hospice, comfort care  Discharge Condition: stable  Discharge Exam:  General: NAD, unable to participate in exam Cardiac: RRR, systolic murmur appreciated Respiratory: rattling breath sounds, stertor continuously  Abdomen: soft, nontender, nondistended, no hepatic or splenomegaly, +BS Extremities: no edema or cyanosis. WWP. Skin: warm and dry, no rashes noted Neuro: alert intermittently and not able to verbalize Psych: sedated   Brief Hospital Course:  Presented for worsening dyspnea. Cxray on admission showed pulmonary edema which worsened on day 2 of admission despite aggressive diuresis. Patient's respiratory status decompensated and patient required max HFNC. Patient and daughter, as patient's decision maker, decided against any invasive or aggressive measures including BiPAP and intubation, or dialysis. A decision was made to move forward with hospice and comfort care in daughter's home. On day of discharge, patient was on 10L HFNC with significant oral secretions and rattling breath sounds. He was alert to voice but fell back asleep and not communicating. Aggressive diuresis with lasix and metolazone was successful in removing a great amount of fluid but patient remained dependent on high flow of oxygen. Patient also had  anemia while inpatient as low as Hgb 6.6 and received 1 unit pRBC bringing Hgb up to 8.0 and was tolerated well. He had a positive FOBT but most contributory cause is likely CKDV. Patient and family had long-standing been opposed to initiating dialysis.  Issues for Follow Up:  1. Hospice care delivery of equipment such as bed and oxygen. 2. Hospice physician to follow up with medication monitoring and prescription  Significant Procedures:  none  Significant Labs and Imaging:  Recent Labs  Lab 12/09/18 0827 12/09/18 1828 12/10/18 0204  WBC 6.1  --  10.8*  HGB 6.6* 7.7* 8.0*  HCT 21.4* 24.0* 24.8*  PLT 125*  --  146*   Recent Labs  Lab 12/09/18 0827 12/10/18 0204  NA 137 136  K 4.7 4.9  CL 95* 98  CO2 24 22  GLUCOSE 170* 186*  BUN 143* 147*  CREATININE 5.61* 5.43*  CALCIUM 9.5 9.4  9.2  ALKPHOS 46 51  AST 40 43*  ALT 56* 56*  ALBUMIN 3.6 3.7   Troponin 0.58>0.79>1.08  ABG    Component Value Date/Time   PHART 7.407 12/10/2018 0310   PCO2ART 38.2 12/10/2018 0310   PO2ART 50.1 (L) 12/10/2018 0310   HCO3 23.6 12/10/2018 0310   TCO2 23 12/04/2016 0954   ACIDBASEDEF 0.5 12/10/2018 0310   O2SAT 84.3 12/10/2018 0310   Hgb A1c 6.4 PTH 199  Results/Tests Pending at Time of Discharge: none  Discharge Medications:  Allergies as of 12/11/2018      Reactions   Flu Virus Vaccine Anaphylaxis   FLU VACCINATION   Fosinopril Sodium Other (See Comments)   Unknown   Hydrocodone-acetaminophen    Other reaction(s): GI Upset (intolerance)   Meloxicam    REACTION: stomah pains, nausea, constipation   Valsartan Other (See Comments)   Other reaction(s):  Other (See Comments) DIOVAN, UNKNOWN - takes losartan at home unknown      Medication List    STOP taking these medications   amLODipine 10 MG tablet Commonly known as:  NORVASC   aspirin EC 81 MG tablet   calcitRIOL 0.5 MCG capsule Commonly known as:  ROCALTROL   cloNIDine 0.2 MG tablet Commonly known as:   CATAPRES   cloNIDine 0.3 MG tablet Commonly known as:  CATAPRES   docusate sodium 100 MG capsule Commonly known as:  COLACE   hydrALAZINE 100 MG tablet Commonly known as:  APRESOLINE   labetalol 200 MG tablet Commonly known as:  NORMODYNE   polyethylene glycol 17 g packet Commonly known as:  MIRALAX / GLYCOLAX   pravastatin 40 MG tablet Commonly known as:  PRAVACHOL   sodium bicarbonate 325 MG tablet     TAKE these medications   acetaminophen 500 MG tablet Commonly known as:  TYLENOL Take 1,000 mg by mouth every 6 (six) hours as needed for mild pain.   albuterol 108 (90 Base) MCG/ACT inhaler Commonly known as:  VENTOLIN HFA Inhale 2 puffs into the lungs 2 (two) times daily.   allopurinol 100 MG tablet Commonly known as:  ZYLOPRIM Take 150 mg by mouth daily.   augmented betamethasone dipropionate 0.05 % ointment Commonly known as:  DIPROLENE-AF Apply 1 application topically 2 (two) times daily. On the back of his head   fluocinonide ointment 0.05 % Commonly known as:  LIDEX Apply 1 application topically 2 (two) times a day. To worst areas of itching.   fluticasone 50 MCG/ACT nasal spray Commonly known as:  FLONASE Place 1 spray into both nostrils 2 (two) times daily.   furosemide 80 MG tablet Commonly known as:  Lasix Take 2 tablets (160 mg total) by mouth 2 (two) times daily. What changed:  how much to take   glycopyrrolate 0.2 MG/ML injection Commonly known as:  ROBINUL Take 5 mLs (1 mg total) by mouth 2 (two) times daily.   isosorbide mononitrate 30 MG 24 hr tablet Commonly known as:  IMDUR Take 1 tablet (30 mg total) by mouth daily. Start taking on:  December 12, 2018 What changed:    medication strength  how much to take   latanoprost 0.005 % ophthalmic solution Commonly known as:  XALATAN Place 1 drop into the left eye at bedtime.   metolazone 5 MG tablet Commonly known as:  ZAROXOLYN Take 1 tablet (5 mg total) by mouth 2 (two) times a  week. Start taking on:  December 14, 2018   montelukast 10 MG tablet Commonly known as:  SINGULAIR Take 10 mg by mouth at bedtime.   morphine CONCENTRATE 10 MG/0.5ML Soln concentrated solution Take 0.25-0.5 mLs (5-10 mg total) by mouth every 2 (two) hours as needed for moderate pain, severe pain or shortness of breath.   Polyethyl Glycol-Propyl Glycol 0.4-0.3 % Soln Place 1 drop into the right eye at bedtime.   scopolamine 1 MG/3DAYS Commonly known as:  TRANSDERM-SCOP Place 1 patch (1.5 mg total) onto the skin every 3 (three) days. Start taking on:  December 14, 2018   senna-docusate 8.6-50 MG tablet Commonly known as:  Senokot-S Take 2 tablets by mouth at bedtime.   tamsulosin 0.4 MG Caps capsule Commonly known as:  FLOMAX Take 1 capsule (0.4 mg total) by mouth daily. What changed:    when to take this  additional instructions       Discharge Instructions: Please refer to Patient Instructions section  of EMR for full details.  Patient was counseled important signs and symptoms that should prompt return to medical care, changes in medications, dietary instructions, activity restrictions, and follow up appointments.   Follow-Up Appointments: [facility] hospice care  Richarda Osmond, DO 12/11/2018, 1:46 PM PGY-1, [place]

## 2018-12-10 NOTE — Progress Notes (Signed)
FPTS Interim Progress Note  S: Called by nurse in [place] that patient is breathing hard and he does not look well Went to bedside, patient continues to be diaphoretic and have labored breathing, though his labored breathing does look mildly improved from previously when he was on 5 last.  He reports that his breathing is better.  O: BP 137/77   Pulse 83   Temp 97.8 F (36.6 C) (Oral)   Resp (!) 28   Ht 6' (1.829 m)   Wt 90.7 kg   SpO2 94%   BMI 27.12 kg/m    General: Diaphoretic sitting up in bed, labored breathing Chest: Regular rate and rhythm Lungs: Crackles and wheezing throughout.  Crackles worse at bases but continue to apices.   A/P:  We will trial 160 mg Lasix and 5 mg metolazone  Called daughter and update her on patient status. No answer, LVM to call [phone] and ask for RN to page team  Wilber Oliphant, MD 12/10/2018, 7:04 AM PGY-1, [place] Medicine Service pager [phone]

## 2018-12-10 NOTE — Progress Notes (Addendum)
FPTS Interim Progress Note  S: Called by nurse who reported desats to the mid-80s, worsening crackles on exam, and diaphoretic.  At bedside, patient reports his breathing started to worsen around 9:00 PM.  O: Heart rate 86, O2 85 to 89%  General: Sitting up in bed, labored breathing Chest: No tenderness to palpation Lungs: Crackles and wheezing throughout Lower extremities: No BLEE  A/P: Spoke to Dr. Johnney Ou from nephrology.  Recommendations to discontinue Lasix drip, start 100 mg IV Lasix.  If no response, increase to 160 mg Lasix and add 5 mg metolazone. Increase oxygen to 5 L from 3 L.  Which improved oxygen to 90. Trial albuterol x1 CXR  Wean oxygen as tolerated Discontinue 10 mL/h Lasix drip Start 100 mg Lasix/50 mL  Reassess urine output and 2 hours Apply condom cath for strict I's and O's okay on when you started the shift he was breathing comfortably on room air Consult CCM if patient's breathing status worsens   [name] Oliphant, MD 12/10/2018, 12:06 AM PGY-1, [place] Medicine Service pager [phone]

## 2018-12-10 NOTE — Progress Notes (Signed)
Pt needing High flow  At 15L and still O2sat  Jumping from 80% to 95%. And still having SOB, labored breathing. 0445 pt transferred to 2C02.

## 2018-12-11 DIAGNOSIS — N179 Acute kidney failure, unspecified: Secondary | ICD-10-CM

## 2018-12-11 DIAGNOSIS — J810 Acute pulmonary edema: Secondary | ICD-10-CM

## 2018-12-11 LAB — PTH, INTACT AND CALCIUM
Calcium, Total (PTH): 9.2 mg/dL (ref 8.6–10.2)
PTH: 199 pg/mL — ABNORMAL HIGH (ref 15–65)

## 2018-12-11 MED ORDER — SCOPOLAMINE 1 MG/3DAYS TD PT72: 1.0000 | MEDICATED_PATCH | TRANSDERMAL | 0 refills | Status: DC

## 2018-12-11 MED ORDER — SCOPOLAMINE 1 MG/3DAYS TD PT72: 1.0000 | MEDICATED_PATCH | TRANSDERMAL | 0 refills | Status: AC

## 2018-12-11 MED ORDER — GLYCOPYRROLATE 0.2 MG/ML IJ SOLN: 1.0000 mg | Freq: Two times a day (BID) | INTRAMUSCULAR | 0 refills | Status: AC

## 2018-12-11 MED ORDER — ISOSORBIDE MONONITRATE ER 30 MG PO TB24
30.0000 mg | ORAL_TABLET | Freq: Every day | ORAL | Status: DC
  Filled 2018-12-11: qty 1

## 2018-12-11 MED ORDER — FUROSEMIDE 80 MG PO TABS: 120.0000 mg | ORAL_TABLET | Freq: Two times a day (BID) | ORAL | 0 refills | Status: DC

## 2018-12-11 MED ORDER — SENNOSIDES-DOCUSATE SODIUM 8.6-50 MG PO TABS: 2.0000 | ORAL_TABLET | Freq: Every day | ORAL | 0 refills | Status: AC

## 2018-12-11 MED ORDER — FUROSEMIDE 80 MG PO TABS: 160.0000 mg | ORAL_TABLET | Freq: Two times a day (BID) | ORAL | 0 refills | Status: AC

## 2018-12-11 MED ORDER — ISOSORBIDE MONONITRATE ER 30 MG PO TB24: 30.0000 mg | ORAL_TABLET | Freq: Every day | ORAL | 0 refills | Status: AC

## 2018-12-11 MED ORDER — TAMSULOSIN HCL 0.4 MG PO CAPS: 0.4000 mg | ORAL_CAPSULE | Freq: Every day | ORAL | 0 refills | Status: AC

## 2018-12-11 MED ORDER — SCOPOLAMINE 1 MG/3DAYS TD PT72
1.0000 | MEDICATED_PATCH | TRANSDERMAL | Status: DC
  Administered 2018-12-11: 12:00:00 1.5 mg via TRANSDERMAL
  Filled 2018-12-11: qty 1

## 2018-12-11 MED ORDER — METOLAZONE 5 MG PO TABS: 5.0000 mg | ORAL_TABLET | ORAL | 0 refills | Status: AC

## 2018-12-11 MED ORDER — GLYCOPYRROLATE 0.2 MG/ML IJ SOLN: 0.4000 mg | INTRAMUSCULAR | 0 refills | Status: DC | PRN

## 2018-12-11 MED ORDER — GLYCOPYRROLATE 0.2 MG/ML IJ SOLN
0.4000 mg | INTRAMUSCULAR | Status: DC | PRN
  Administered 2018-12-11 (×2): 0.4 mg via INTRAVENOUS
  Filled 2018-12-11 (×2): qty 2

## 2018-12-11 MED ORDER — SENNOSIDES-DOCUSATE SODIUM 8.6-50 MG PO TABS: 2.0000 | ORAL_TABLET | Freq: Every day | ORAL | Status: DC

## 2018-12-11 MED ORDER — MORPHINE SULFATE (CONCENTRATE) 10 MG/0.5ML PO SOLN: 5.0000 mg | ORAL | 0 refills | Status: AC | PRN

## 2018-12-11 NOTE — TOC Progression Note (Addendum)
Transition of Care [facility]) - Progression Note    Patient Details  Name: [name] [name] MRN: [id number] Date of Birth: 01/26/1929  [facility]) CM/SW Contact  Bruce Mayers, Abelino Derrick, RN Phone Number: 12/11/2018, 8:34 AM  Clinical Narrative:   [facility] confirmed this am that pt has been accepted and equipment has been ordered -equipment scheduled to be delivered between 10am and 2pm. Daughter will call CM once all equipment has been delivered.  CSW will arrange transport home via PTAR  Update: Daughter aware that she needs to pick up discharge meds prior to pt arriving home - bedside confirmed that meds are ready for pick up at pharmacy.   Bedside nurse confirmed all equipment including oxygen has been delivered and set up in the home - nurse to call PTAR once daughter confirms she is back in the home today.   [facility] aware that pt will discharge home soon - daughter has [facility] contact information  Expected Discharge Plan: Home w Hospice Care Barriers to Discharge: Barriers Resolved  Expected Discharge Plan and Services Expected Discharge Plan: [place]   Discharge Planning Services: CM Consult Post Acute Care Choice: Hospice Living arrangements for the past 2 months: [place] Agency: [facility] [place]) Date [facility] Agency Contacted: 12/03/18 Time [place]: 6659 Representative spoke with at [place]: Delsa Sale with [facility])   Social Determinants of Health (SDOH) Interventions    Readmission Risk Interventions No flowsheet data found.

## 2018-12-11 NOTE — Care Management Important Message (Signed)
Important Message  Patient Details  Name: [name] [name] MRN: [id number] Date of Birth: 1929-08-19   Medicare Important Message Given:  Yes    [name] 12/11/2018, 1:51 PM

## 2018-12-11 NOTE — Care Management Important Message (Signed)
Important Message  Patient Details  Name: [name] [name] MRN: [id number] Date of Birth: 08/13/1929   Medicare Important Message Given:  Yes Unsigned copy left.  Due to illness patient could not sign.  Keisy Strickler 12/11/2018, 1:52 PM

## 2018-12-11 NOTE — Plan of Care (Addendum)
DC instructions reviewed with daughter. Pt going home on hospice. Foley intact upon DC per order for EOL care. VSS, BP 117sys, on 10L HFNC SpO2 92-96%. Will premedicate with robinal and liquid morhpine prior to DC via PTAR once daughter has all supplies at home. All belongings sent home with patient.  GOLD DNR form sent home with PTAR/discharge packet.    Pt resting comfortably in bed.  Delayed DC: Awaiting all equipment to be delivered to daughter today for proper hospice care.    1500: RN called [name] [name], daughter. Home ready per daughter--bed and oxygen set up, daughter states she feels comfortable and ready for him to come home. RN called CSW-arranged PTAR for next available.   1507: Medicated for increased secretions, pt unable to clear and pain control.    Awaiting PTAR.   1600: PTAR at bedside. Daughter called by CM to pick up meds at [facility]. PIV DC x2. Hemostasis achieved. VSS. Pt wished comfort and peaceful ease.    Problem: Education: Goal: Knowledge of General Education information will improve Description Including pain rating scale, medication(s)/side effects and non-pharmacologic comfort measures Outcome: Adequate for Discharge   Problem: Health Behavior/Discharge Planning: Goal: Ability to manage health-related needs will improve Outcome: Adequate for Discharge   Problem: Clinical Measurements: Goal: Ability to maintain clinical measurements within normal limits will improve Outcome: Adequate for Discharge Goal: Will remain free from infection Outcome: Adequate for Discharge Goal: Diagnostic test results will improve Outcome: Adequate for Discharge Goal: Respiratory complications will improve Outcome: Adequate for Discharge Goal: Cardiovascular complication will be avoided Outcome: Adequate for Discharge   Problem: Activity: Goal: Risk for activity intolerance will decrease Outcome: Adequate for Discharge   Problem: Nutrition: Goal: Adequate  nutrition will be maintained Outcome: Adequate for Discharge   Problem: Coping: Goal: Level of anxiety will decrease Outcome: Adequate for Discharge   Problem: Elimination: Goal: Will not experience complications related to bowel motility Outcome: Adequate for Discharge Goal: Will not experience complications related to urinary retention Outcome: Adequate for Discharge   Problem: Pain Managment: Goal: General experience of comfort will improve Outcome: Adequate for Discharge   Problem: Safety: Goal: Ability to remain free from injury will improve Outcome: Adequate for Discharge   Problem: Skin Integrity: Goal: Risk for impaired skin integrity will decrease Outcome: Adequate for Discharge   Problem: Education: Goal: Ability to demonstrate management of disease process will improve Outcome: Adequate for Discharge Goal: Ability to verbalize understanding of medication therapies will improve Outcome: Adequate for Discharge Goal: Individualized Educational Video(s) Outcome: Adequate for Discharge   Problem: Activity: Goal: Capacity to carry out activities will improve Outcome: Adequate for Discharge   Problem: Cardiac: Goal: Ability to achieve and maintain adequate cardiopulmonary perfusion will improve Outcome: Adequate for Discharge

## 2018-12-11 NOTE — Progress Notes (Signed)
Palliative Medicine RN Note: Rec'd call from RN that pt is having terminal secretions. Obtained order for Robinul and scop patch. Patient has anticipated d/c today, so Robinul is needed to cover him while scop patch has time to work.  Please place order for scop patch in d/c orders.  Marjie Skiff Indianna Boran, RN, BSN, [facility] Palliative Medicine Team 12/11/2018 8:53 AM Office [phone]

## 2018-12-11 NOTE — Progress Notes (Signed)
Progress Note  Patient Name: [name] [name] Date of Encounter: 12/11/2018  Primary Cardiologist: Lucita Ferrara, MD [facility] hospital  Subjective   Patient sleeping currently, appears comfortable.  He is on 10L oxygen.  Better UOP yesterday.   Inpatient Medications    Scheduled Meds: . amLODipine  10 mg Oral QHS  . docusate sodium  100 mg Oral Daily  . fluticasone  1 spray Each Nare BID  . hydrALAZINE  50 mg Oral TID  . isosorbide dinitrate  5 mg Oral TID  . latanoprost  1 drop Left Eye QHS  . montelukast  10 mg Oral QHS  . polyvinyl alcohol  1 drop Right Eye QHS  . sodium chloride flush  3 mL Intravenous Q12H  . tamsulosin  0.4 mg Oral Daily   Continuous Infusions: . furosemide 160 mg (12/10/18 1652)   PRN Meds: albuterol, morphine CONCENTRATE, polyethylene glycol   Vital Signs    Vitals:   12/11/18 0336 12/11/18 0558 12/11/18 0730 12/11/18 0743  BP: 127/72   130/64  Pulse: 80  97 86  Resp: 16  20 13   Temp: 99.7 F (37.6 C)   98.3 F (36.8 C)  TempSrc: Axillary   Oral  SpO2: 95%  96% 98%  Weight:  87.8 kg    Height:        Intake/Output Summary (Last 24 hours) at 12/11/2018 0750 Last data filed at 12/11/2018 0744 Gross per 24 hour  Intake 302.71 ml  Output 3750 ml  Net -3447.29 ml   Filed Weights   12/10/18 0154 12/10/18 0459 12/11/18 0558  Weight: 92.9 kg 90.7 kg 87.8 kg   Physical Exam   General: NAD, sleeping Neck: JVP 12-14, no thyromegaly or thyroid nodule.  Lungs: Crackles at bases.  CV: Nondisplaced PMI.  Heart regular S1/S2, no S3/S4, no murmur.  No peripheral edema.   Abdomen: Soft, nontender, no hepatosplenomegaly, no distention.  Skin: Intact without lesions or rashes.  Neurologic: Alert and oriented x 3.  Psych: Normal affect. Extremities: No clubbing or cyanosis.  HEENT: Normal.   Labs    Chemistry Recent Labs  Lab 12/09/18 0827 12/10/18 0204  NA 137 136  K 4.7 4.9  CL 95* 98  CO2 24 22   GLUCOSE 170* 186*  BUN 143* 147*  CREATININE 5.61* 5.43*  CALCIUM 9.5 9.4  9.2  PROT 6.9 7.3  ALBUMIN 3.6 3.7  AST 40 43*  ALT 56* 56*  ALKPHOS 46 51  BILITOT 0.9 1.1  GFRNONAA 8* 9*  GFRAA 10* 10*  ANIONGAP 18* 16*     Hematology Recent Labs  Lab 12/09/18 0827 12/09/18 1828 12/10/18 0204  WBC 6.1  --  10.8*  RBC 2.27*  --  2.74*  HGB 6.6* 7.7* 8.0*  HCT 21.4* 24.0* 24.8*  MCV 94.3  --  90.5  MCH 29.1  --  29.2  MCHC 30.8  --  32.3  RDW 15.2  --  15.4  PLT 125*  --  146*    Cardiac Enzymes Recent Labs  Lab 12/09/18 0827 12/09/18 2033 12/10/18 0204  TROPONINI 0.58* 0.79* 1.08*   No results for input(s): TROPIPOC in the last 168 hours.   BNPNo results for input(s): BNP, PROBNP in the last 168 hours.   DDimer No results for input(s): DDIMER in the last 168 hours.   Radiology    Dg Chest Port 1 View  Result Date: 12/10/2018 CLINICAL DATA:  Increased shortness of breath  EXAM: PORTABLE CHEST 1 VIEW COMPARISON:  12/09/2018 FINDINGS: Cardiomegaly. Worsening bilateral mid and lower lung airspace opacities. Favor edema although infection is not excluded. Possible small layering effusions. No acute bony abnormality. IMPRESSION: Cardiomegaly. Worsening bilateral mid and lower lobe airspace opacities, favor edema/CHF although infection is not excluded. Possible small effusions. Electronically Signed   By: Rolm Baptise M.D.   On: 12/10/2018 01:17   Dg Chest Portable 1 View  Result Date: 12/09/2018 CLINICAL DATA:  Chest pain EXAM: PORTABLE CHEST 1 VIEW COMPARISON:  July 22, 2018 FINDINGS: There is cardiomegaly with mild pulmonary venous hypertension. There is interstitial edema in the lung bases. There is no appreciable airspace consolidation. There is aortic atherosclerosis. No adenopathy. No bone lesions. IMPRESSION: Pulmonary vascular congestion with bibasilar interstitial edema. Suspect a degree of congestive heart failure. No consolidation. Aortic Atherosclerosis  (ICD10-I70.0). Electronically Signed   By: Lowella Grip III M.D.   On: 12/09/2018 08:44   Telemetry    NSR 100s - Personally Reviewed  ECG    No new tracing as of 12/11/2018- Personally Reviewed  Cardiac Studies   Echo 07/24/18: Study Conclusions  - Left ventricle: The cavity size was moderately dilated. Wall thickness was increased in a pattern of moderate LVH. Systolic function was normal. The estimated ejection fraction was in the range of 50% to 55%. The study is not technically sufficient to allow evaluation of LV diastolic function. - Aortic valve: There was moderate stenosis. There was mild regurgitation. Valve area (VTI): 1.08 cm^2. Valve area (Vmax): 1.18 cm^2. Valve area (Vmean): 1.16 cm^2. - Mitral valve: Severely calcified annulus. Moderately thickened, mildly calcified leaflets . There was mild regurgitation. Valve area by continuity equation (using LVOT flow): 2.1 cm^2. - Left atrium: The atrium was severely dilated. - Atrial septum: No defect or patent foramen ovale was identified.  Patient Profile     85 y.o. male with a hx of chronic diastolic HF with normal EF 65% in 2016, CKD5 with no intention of dialysis per chart, resistant HTN, DM-2, HLD and anemia with blood transfusionswho is being seen today for the evaluation of chest pain and CHF with wt gain, now presenting to ER  Assessment & Plan   1.  Acute on chronic diastolic CHF: -Continues with IV Lasix 160 mg with p.o. metolazone 5 mg with limited diuresis -I&O, net -3.4 L since hospital admission -Weight, 193lb today with an admission weight of 205lb -Patient has confirmed with nephrology that he is unwilling to proceed with dialysis>>> palliative care is been consulted and he is now a DNR -Plan is for transport home with daughter on home 15 L high flow [place] as well as comfort measures  2.  Acute on chronic anemia: -Hemoglobin 8.0 as of yesterday, 12/10/2018 -Has history of mild  myelodysplasia as well as heme + stool. -s/p Aranesp on 12/09/2018 nephrology  3.  Moderate AS: -Per echocardiogram  4.  CKD stage V: -Followed by nephrology -No plans to pursue dialysis at this time per patient's request -Palliative care medicine has been consulted with plans for transport home on high flow nasal cannula and comfort measures  5.  Elevated troponin with chest pain: -Flat trend, likely in the setting of demand ischemia with acute fluid volume overload, CKD stage V and anemia with no plans for further ischemic evaluation   Signed, Kathyrn Drown NP-C [place] Pager: [phone] 12/11/2018, 7:50 AM     For questions or updates, please contact   Please consult [email or web address] for contact info under  Cardiology/STEMI.  Patient seen with NP, agree with the above note.   Plan currently is to send him home with hospice.  He does not want dialysis.  He had better UOP yesterday though BUN/creatinine remain markedly high.  He is down to 10L oxygen.  He remains volume overloaded on exam.   Would send him home on Lasix 160 mg po bid to keep him as comfortable as possible in terms of his breathing.   Loralie Champagne 12/11/2018 12:36 PM

## 2018-12-11 NOTE — Progress Notes (Addendum)
Hydrologist [facility])  Hospital Liaison: RN visit   Notified by Elenor Quinones, [facility] of patient/family request for [facility] services at home after discharge. Chart and patient information reviewed by [facility] physician. Hospice eligibility confirmed.     Writer spoke with daughter to initiate education related to hospice philosophy, services and team approach to care. Daughter verbalized understanding of information given. Per discussion, plan is for discharge to home by [facility].     Please send signed and completed DNR form home with patient/family. Patient will need prescriptions for discharge comfort medications.     DME needs have been discussed, patient currently has the following equipment in the home: 3N1.  Patient/family requests the following DME for delivery to the home: Hospital [facility] Oxygen. [place] equipment manager has been notified and will contact [employer] to arrange delivery to the home. Home address has been verified and is correct in the chart. [place] is the family member to contact to arrange time of delivery.     [facility] aware of the above. Please notify [facility] when patient is ready to leave the unit at discharge. (Call [phone] or [phone] after 5pm.) [facility] information and contact numbers given to  [place].      Please call with any hospice related questions.     Thank you for this referral.     Farrel Gordon, RN, CCM  [place] (listed on AMION under [facility])  [phone]

## 2018-12-18 DEATH — deceased

## 2020-02-10 IMAGING — CR DG FOOT COMPLETE 3+V*R*
3 series · 3 of 3 positions shown · non-contrast
Comparison: Right foot films of 10/25/2017

CLINICAL DATA: History of gout, right foot pain and swelling over 3
days, no injury, cannot bear weight

EXAM:
RIGHT FOOT COMPLETE - 3+ VIEW

[foot ap]
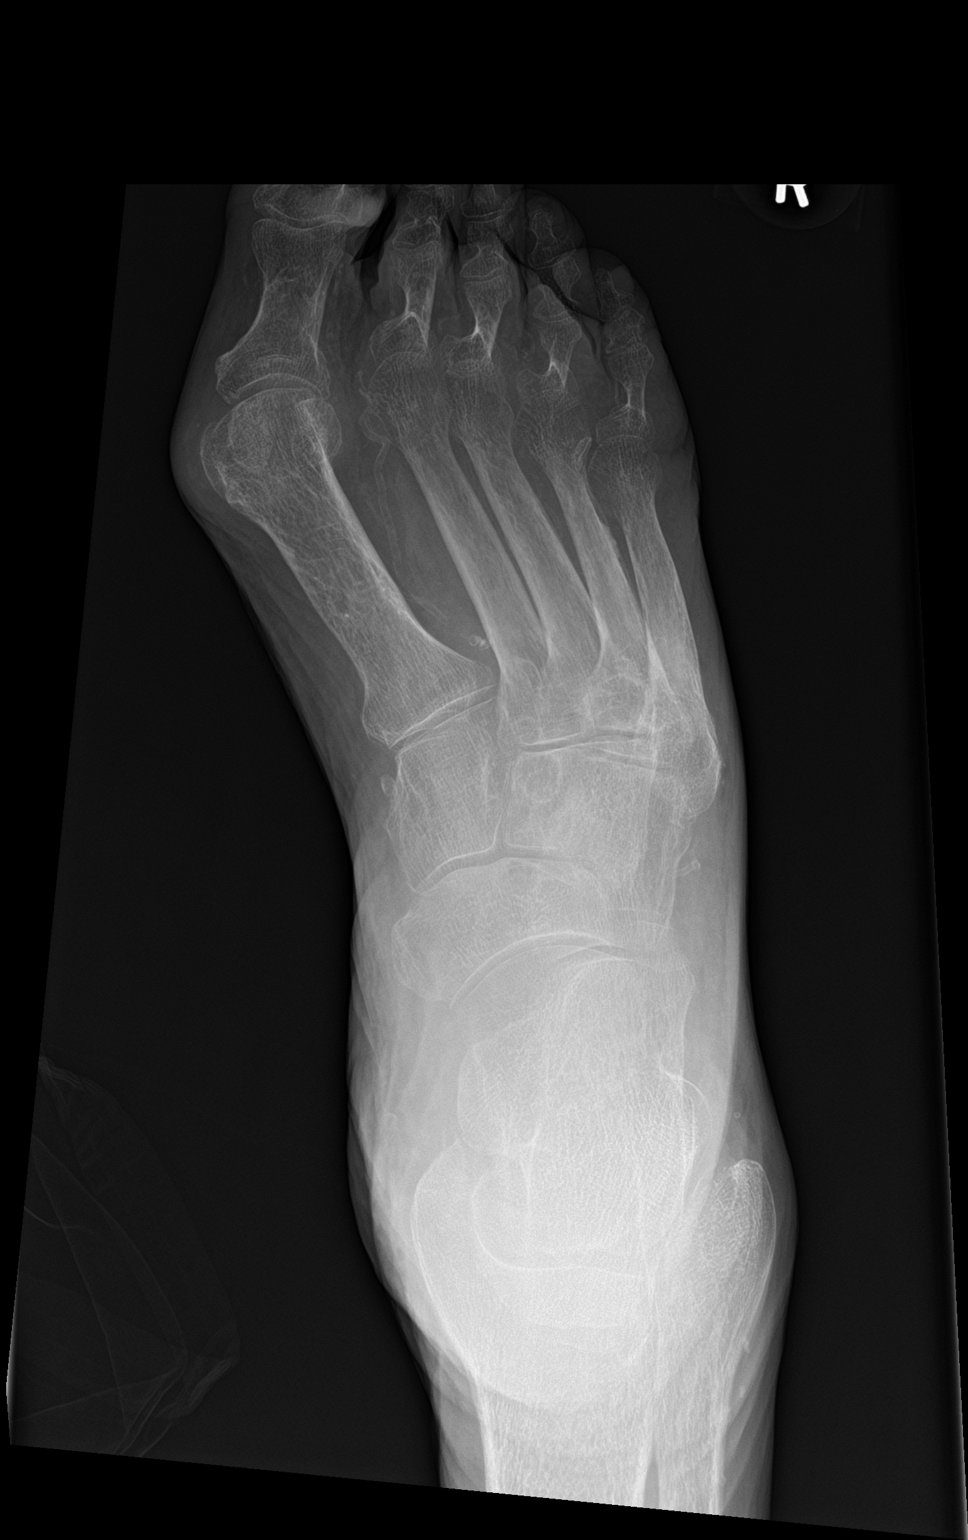

[foot obl]
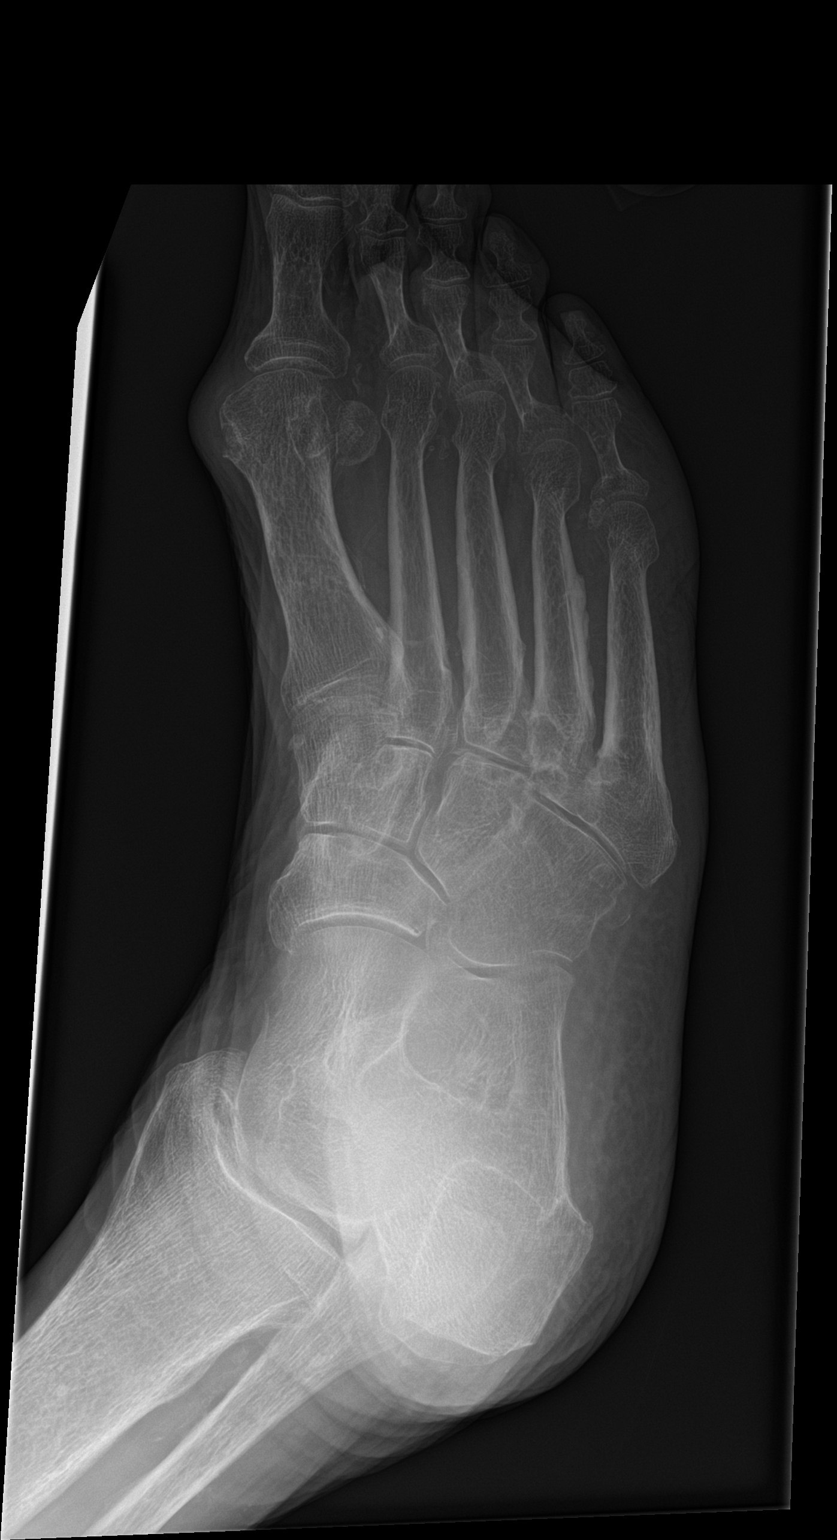

[foot lat]
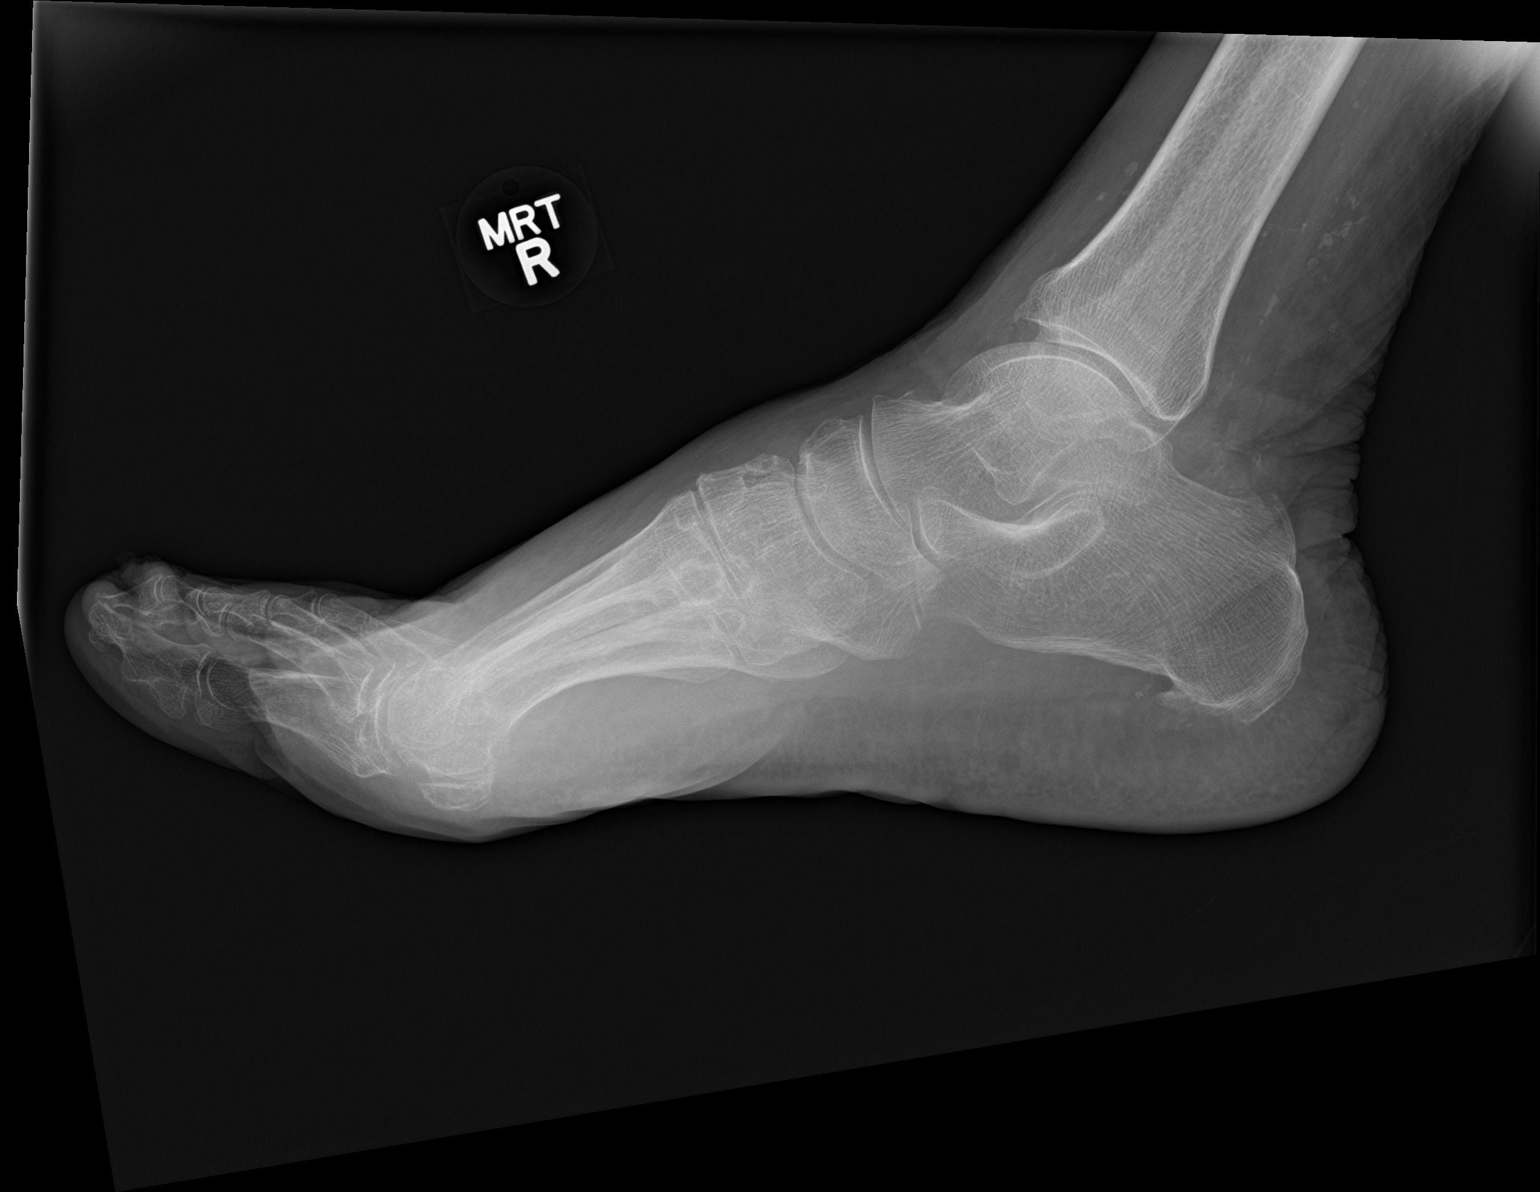

[3 of 3 positions shown; findings below may reference images not displayed]

FINDINGS: Again hallux valgus is noted and there is some degenerative change
of the right first MTP joint. Better seen on oblique views, there is
and erosive process involving the head-neck of the right first
metatarsal with some soft tissue swelling most consistent with
changes of gout. Also there are erosive changes at the tarsal
metatarsal articulations which may represent gout as well. No acute
fracture is seen. A small plantar calcaneal degenerative spur is
noted.
IMPRESSION: 1. Changes primarily consistent with gout involving the right first
metatarsal head and the tarsal-metatarsal articulations.
2. Hallux valgus.
3. No acute abnormality.

## 2021-03-24 IMAGING — DX PORTABLE CHEST - 1 VIEW
1 series · 1 of 1 positions shown · non-contrast
Comparison: 12/09/2018

CLINICAL DATA: Increased shortness of breath

EXAM:
PORTABLE CHEST 1 VIEW

[chest]
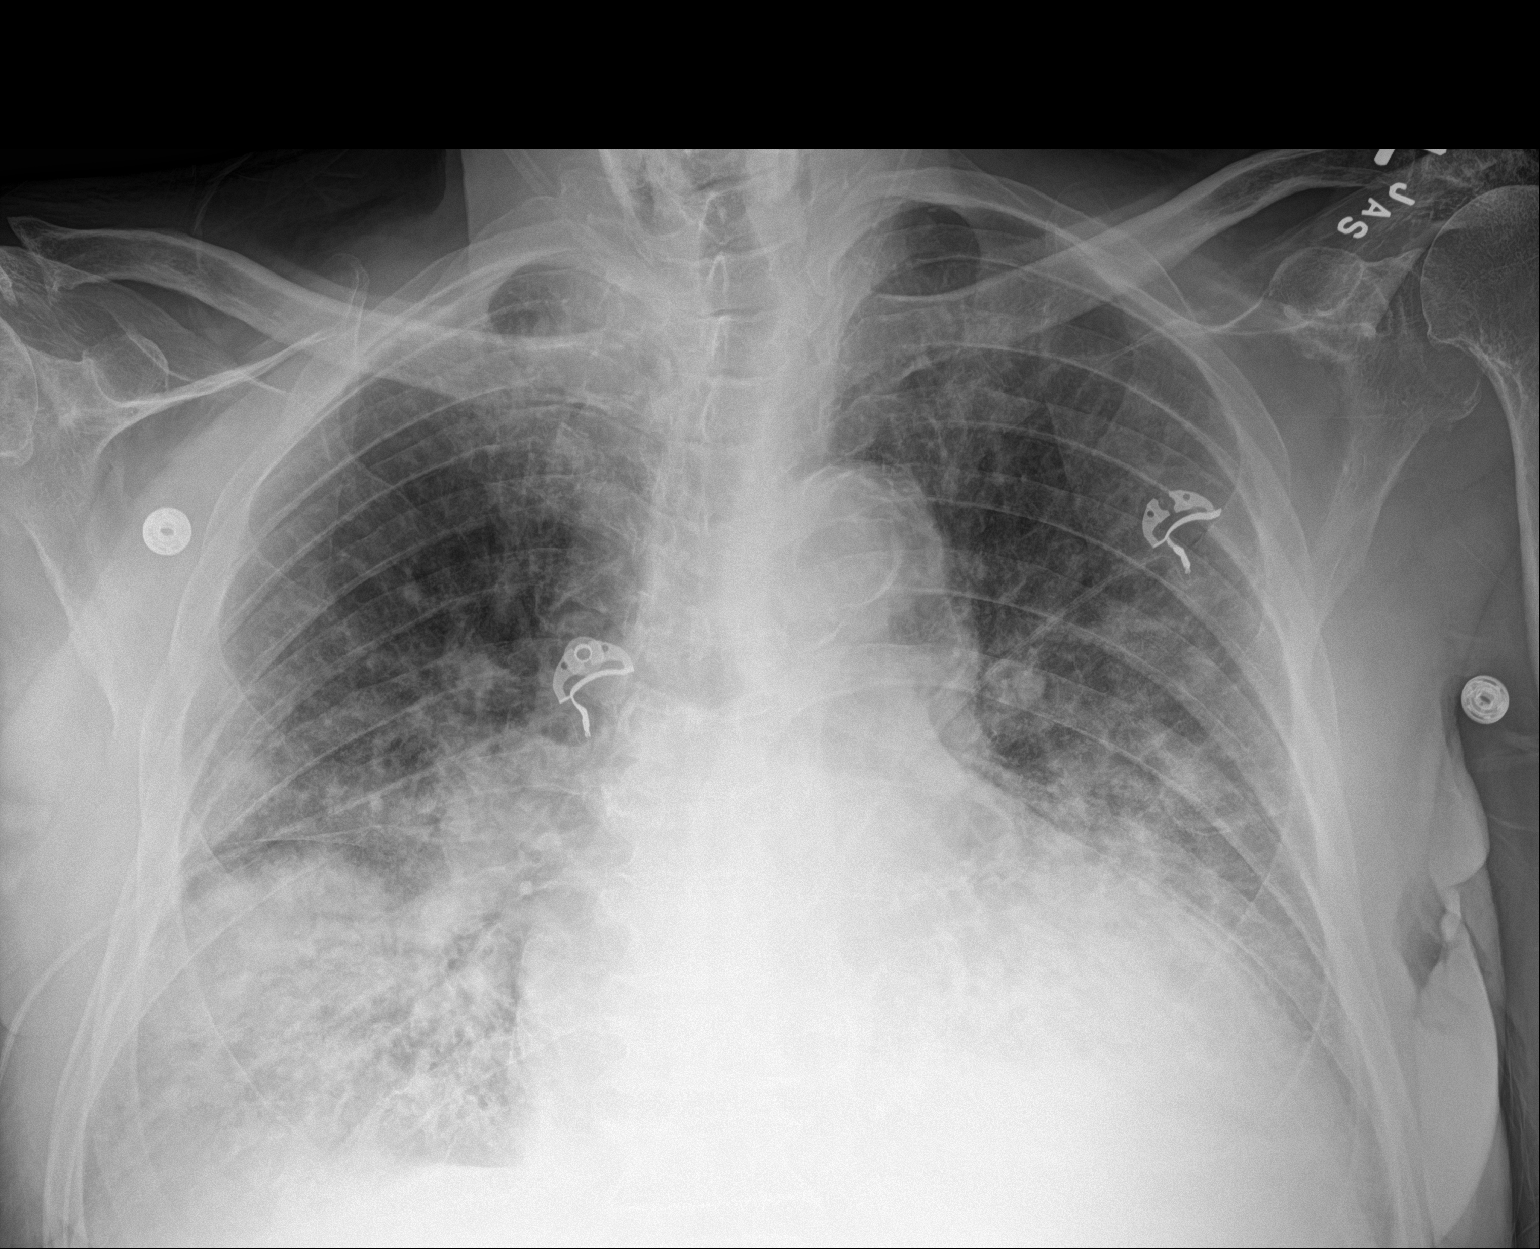

[1 of 1 positions shown; findings below may reference images not displayed]

FINDINGS: Cardiomegaly. Worsening bilateral mid and lower lung airspace
opacities. Favor edema although infection is not excluded. Possible
small layering effusions. No acute bony abnormality.
IMPRESSION: Cardiomegaly. Worsening bilateral mid and lower lobe airspace
opacities, favor edema/CHF although infection is not excluded.
Possible small effusions.
